# Patient Record
Sex: Female | Born: 1950 | Race: White | Hispanic: No | Marital: Married | Smoking: Never smoker
Health system: Southern US, Community
[De-identification: ages and names within clinical notes are randomized; demographics above are authoritative.]

## PROBLEM LIST (undated history)

## (undated) DIAGNOSIS — M419 Scoliosis, unspecified: Secondary | ICD-10-CM

## (undated) DIAGNOSIS — R002 Palpitations: Secondary | ICD-10-CM

## (undated) DIAGNOSIS — H6120 Impacted cerumen, unspecified ear: Secondary | ICD-10-CM

## (undated) DIAGNOSIS — J329 Chronic sinusitis, unspecified: Secondary | ICD-10-CM

## (undated) DIAGNOSIS — R0981 Nasal congestion: Secondary | ICD-10-CM

## (undated) DIAGNOSIS — K5909 Other constipation: Secondary | ICD-10-CM

## (undated) DIAGNOSIS — N200 Calculus of kidney: Secondary | ICD-10-CM

## (undated) DIAGNOSIS — K279 Peptic ulcer, site unspecified, unspecified as acute or chronic, without hemorrhage or perforation: Secondary | ICD-10-CM

## (undated) DIAGNOSIS — D229 Melanocytic nevi, unspecified: Secondary | ICD-10-CM

## (undated) DIAGNOSIS — R0789 Other chest pain: Secondary | ICD-10-CM

## (undated) DIAGNOSIS — E785 Hyperlipidemia, unspecified: Secondary | ICD-10-CM

## (undated) DIAGNOSIS — K219 Gastro-esophageal reflux disease without esophagitis: Secondary | ICD-10-CM

## (undated) DIAGNOSIS — K5790 Diverticulosis of intestine, part unspecified, without perforation or abscess without bleeding: Secondary | ICD-10-CM

## (undated) DIAGNOSIS — H409 Unspecified glaucoma: Secondary | ICD-10-CM

## (undated) DIAGNOSIS — M8580 Other specified disorders of bone density and structure, unspecified site: Secondary | ICD-10-CM

## (undated) HISTORY — DX: Palpitations: R00.2

## (undated) HISTORY — PX: LAPAROSCOPIC CHOLECYSTECTOMY: SUR755

## (undated) HISTORY — DX: Calculus of kidney: N20.0

## (undated) HISTORY — DX: Other specified disorders of bone density and structure, unspecified site: M85.80

## (undated) HISTORY — DX: Impacted cerumen, unspecified ear: H61.20

## (undated) HISTORY — PX: COLPOSCOPY: SHX161

## (undated) HISTORY — DX: Gastro-esophageal reflux disease without esophagitis: K21.9

## (undated) HISTORY — DX: Chronic sinusitis, unspecified: J32.9

## (undated) HISTORY — DX: Unspecified glaucoma: H40.9

## (undated) HISTORY — PX: GYNECOLOGIC CRYOSURGERY: SHX857

## (undated) HISTORY — DX: Nasal congestion: R09.81

## (undated) HISTORY — DX: Peptic ulcer, site unspecified, unspecified as acute or chronic, without hemorrhage or perforation: K27.9

## (undated) HISTORY — DX: Scoliosis, unspecified: M41.9

## (undated) HISTORY — DX: Melanocytic nevi, unspecified: D22.9

## (undated) HISTORY — DX: Diverticulosis of intestine, part unspecified, without perforation or abscess without bleeding: K57.90

## (undated) HISTORY — PX: CARDIAC CATHETERIZATION: SHX172

## (undated) HISTORY — DX: Hyperlipidemia, unspecified: E78.5

## (undated) HISTORY — DX: Other constipation: K59.09

## (undated) HISTORY — DX: Other chest pain: R07.89

---

## 1989-01-03 HISTORY — PX: TUBAL LIGATION: SHX77

## 1999-05-11 ENCOUNTER — Encounter: Payer: Self-pay | Admitting: Obstetrics and Gynecology

## 1999-05-11 ENCOUNTER — Encounter: Admission: RE | Admit: 1999-05-11 | Discharge: 1999-05-11 | Payer: Self-pay | Admitting: Obstetrics and Gynecology

## 1999-10-07 ENCOUNTER — Encounter (INDEPENDENT_AMBULATORY_CARE_PROVIDER_SITE_OTHER): Payer: Self-pay

## 1999-10-07 ENCOUNTER — Other Ambulatory Visit: Admission: RE | Admit: 1999-10-07 | Discharge: 1999-10-07 | Payer: Self-pay | Admitting: Obstetrics and Gynecology

## 2000-06-29 ENCOUNTER — Encounter: Payer: Self-pay | Admitting: Obstetrics and Gynecology

## 2000-06-29 ENCOUNTER — Encounter: Admission: RE | Admit: 2000-06-29 | Discharge: 2000-06-29 | Payer: Self-pay | Admitting: Obstetrics and Gynecology

## 2000-06-29 ENCOUNTER — Other Ambulatory Visit: Admission: RE | Admit: 2000-06-29 | Discharge: 2000-06-29 | Payer: Self-pay | Admitting: Obstetrics and Gynecology

## 2000-09-27 ENCOUNTER — Encounter: Payer: Self-pay | Admitting: Gastroenterology

## 2001-02-08 ENCOUNTER — Encounter: Payer: Self-pay | Admitting: Family Medicine

## 2001-02-08 ENCOUNTER — Encounter: Admission: RE | Admit: 2001-02-08 | Discharge: 2001-02-08 | Payer: Self-pay | Admitting: Family Medicine

## 2001-05-26 ENCOUNTER — Observation Stay (HOSPITAL_COMMUNITY): Admission: EM | Admit: 2001-05-26 | Discharge: 2001-05-29 | Payer: Self-pay | Admitting: Emergency Medicine

## 2001-05-26 ENCOUNTER — Encounter: Payer: Self-pay | Admitting: *Deleted

## 2001-06-29 ENCOUNTER — Other Ambulatory Visit: Admission: RE | Admit: 2001-06-29 | Discharge: 2001-06-29 | Payer: Self-pay | Admitting: Obstetrics and Gynecology

## 2001-07-18 ENCOUNTER — Encounter: Admission: RE | Admit: 2001-07-18 | Discharge: 2001-07-18 | Payer: Self-pay | Admitting: Obstetrics and Gynecology

## 2001-07-18 ENCOUNTER — Encounter: Payer: Self-pay | Admitting: Obstetrics and Gynecology

## 2002-07-04 ENCOUNTER — Other Ambulatory Visit: Admission: RE | Admit: 2002-07-04 | Discharge: 2002-07-04 | Payer: Self-pay | Admitting: Obstetrics and Gynecology

## 2002-07-23 ENCOUNTER — Encounter: Payer: Self-pay | Admitting: Obstetrics and Gynecology

## 2002-07-23 ENCOUNTER — Encounter: Admission: RE | Admit: 2002-07-23 | Discharge: 2002-07-23 | Payer: Self-pay | Admitting: Obstetrics and Gynecology

## 2003-07-10 ENCOUNTER — Other Ambulatory Visit: Admission: RE | Admit: 2003-07-10 | Discharge: 2003-07-10 | Payer: Self-pay | Admitting: Obstetrics and Gynecology

## 2003-09-28 DIAGNOSIS — K644 Residual hemorrhoidal skin tags: Secondary | ICD-10-CM | POA: Insufficient documentation

## 2004-01-27 ENCOUNTER — Ambulatory Visit: Payer: Self-pay | Admitting: Gastroenterology

## 2004-03-02 ENCOUNTER — Encounter: Payer: Self-pay | Admitting: Gastroenterology

## 2004-03-02 ENCOUNTER — Ambulatory Visit (HOSPITAL_COMMUNITY): Admission: RE | Admit: 2004-03-02 | Discharge: 2004-03-02 | Payer: Self-pay | Admitting: Gastroenterology

## 2004-03-09 ENCOUNTER — Ambulatory Visit: Payer: Self-pay | Admitting: Gastroenterology

## 2004-06-23 ENCOUNTER — Ambulatory Visit (HOSPITAL_COMMUNITY): Admission: RE | Admit: 2004-06-23 | Discharge: 2004-06-23 | Payer: Self-pay | Admitting: Addiction Medicine

## 2004-07-14 ENCOUNTER — Other Ambulatory Visit: Admission: RE | Admit: 2004-07-14 | Discharge: 2004-07-14 | Payer: Self-pay | Admitting: Obstetrics and Gynecology

## 2004-11-02 ENCOUNTER — Encounter: Admission: RE | Admit: 2004-11-02 | Discharge: 2004-11-02 | Payer: Self-pay | Admitting: Obstetrics and Gynecology

## 2004-11-02 ENCOUNTER — Ambulatory Visit: Payer: Self-pay | Admitting: Gastroenterology

## 2005-01-03 HISTORY — PX: OTHER SURGICAL HISTORY: SHX169

## 2005-05-10 DIAGNOSIS — D229 Melanocytic nevi, unspecified: Secondary | ICD-10-CM

## 2005-05-10 HISTORY — DX: Melanocytic nevi, unspecified: D22.9

## 2005-07-20 ENCOUNTER — Ambulatory Visit (HOSPITAL_COMMUNITY): Admission: RE | Admit: 2005-07-20 | Discharge: 2005-07-20 | Payer: Self-pay | Admitting: Obstetrics and Gynecology

## 2005-07-20 ENCOUNTER — Other Ambulatory Visit: Admission: RE | Admit: 2005-07-20 | Discharge: 2005-07-20 | Payer: Self-pay | Admitting: Obstetrics and Gynecology

## 2005-10-12 ENCOUNTER — Ambulatory Visit: Payer: Self-pay | Admitting: Gastroenterology

## 2006-01-20 ENCOUNTER — Ambulatory Visit: Payer: Self-pay | Admitting: Gastroenterology

## 2006-01-27 ENCOUNTER — Ambulatory Visit: Payer: Self-pay | Admitting: Gastroenterology

## 2006-01-27 ENCOUNTER — Encounter (INDEPENDENT_AMBULATORY_CARE_PROVIDER_SITE_OTHER): Payer: Self-pay | Admitting: *Deleted

## 2006-01-27 DIAGNOSIS — D1399 Benign neoplasm of ill-defined sites within the digestive system: Secondary | ICD-10-CM | POA: Insufficient documentation

## 2006-01-27 DIAGNOSIS — K449 Diaphragmatic hernia without obstruction or gangrene: Secondary | ICD-10-CM | POA: Insufficient documentation

## 2006-01-27 DIAGNOSIS — D139 Benign neoplasm of ill-defined sites within the digestive system: Secondary | ICD-10-CM

## 2006-01-27 DIAGNOSIS — K222 Esophageal obstruction: Secondary | ICD-10-CM

## 2006-04-11 ENCOUNTER — Ambulatory Visit: Payer: Self-pay | Admitting: Cardiology

## 2006-04-26 ENCOUNTER — Ambulatory Visit: Payer: Self-pay | Admitting: *Deleted

## 2006-07-25 ENCOUNTER — Ambulatory Visit (HOSPITAL_COMMUNITY): Admission: RE | Admit: 2006-07-25 | Discharge: 2006-07-25 | Payer: Self-pay | Admitting: Obstetrics and Gynecology

## 2006-07-25 ENCOUNTER — Other Ambulatory Visit: Admission: RE | Admit: 2006-07-25 | Discharge: 2006-07-25 | Payer: Self-pay | Admitting: Obstetrics and Gynecology

## 2006-10-20 ENCOUNTER — Encounter: Admission: RE | Admit: 2006-10-20 | Discharge: 2006-10-20 | Payer: Self-pay | Admitting: Family Medicine

## 2006-11-23 ENCOUNTER — Ambulatory Visit: Payer: Self-pay | Admitting: Gastroenterology

## 2006-11-28 ENCOUNTER — Ambulatory Visit (HOSPITAL_COMMUNITY): Admission: RE | Admit: 2006-11-28 | Discharge: 2006-11-28 | Payer: Self-pay | Admitting: Gastroenterology

## 2007-01-08 ENCOUNTER — Encounter: Admission: RE | Admit: 2007-01-08 | Discharge: 2007-01-08 | Payer: Self-pay | Admitting: General Surgery

## 2007-01-31 DIAGNOSIS — K589 Irritable bowel syndrome without diarrhea: Secondary | ICD-10-CM | POA: Insufficient documentation

## 2007-01-31 DIAGNOSIS — K219 Gastro-esophageal reflux disease without esophagitis: Secondary | ICD-10-CM | POA: Insufficient documentation

## 2007-02-05 ENCOUNTER — Encounter: Admission: RE | Admit: 2007-02-05 | Discharge: 2007-02-05 | Payer: Self-pay | Admitting: General Surgery

## 2007-03-03 ENCOUNTER — Emergency Department (HOSPITAL_COMMUNITY): Admission: EM | Admit: 2007-03-03 | Discharge: 2007-03-03 | Payer: Self-pay | Admitting: Emergency Medicine

## 2007-03-20 ENCOUNTER — Ambulatory Visit: Payer: Self-pay | Admitting: Cardiology

## 2007-07-26 ENCOUNTER — Ambulatory Visit (HOSPITAL_COMMUNITY): Admission: RE | Admit: 2007-07-26 | Discharge: 2007-07-26 | Payer: Self-pay | Admitting: Obstetrics and Gynecology

## 2007-07-26 ENCOUNTER — Other Ambulatory Visit: Admission: RE | Admit: 2007-07-26 | Discharge: 2007-07-26 | Payer: Self-pay | Admitting: Obstetrics and Gynecology

## 2007-09-18 ENCOUNTER — Ambulatory Visit: Payer: Self-pay | Admitting: Obstetrics and Gynecology

## 2007-11-16 ENCOUNTER — Encounter: Admission: RE | Admit: 2007-11-16 | Discharge: 2007-11-16 | Payer: Self-pay | Admitting: Family Medicine

## 2008-07-29 ENCOUNTER — Ambulatory Visit: Payer: Self-pay | Admitting: Obstetrics and Gynecology

## 2008-07-29 ENCOUNTER — Other Ambulatory Visit: Admission: RE | Admit: 2008-07-29 | Discharge: 2008-07-29 | Payer: Self-pay | Admitting: Obstetrics and Gynecology

## 2008-07-29 ENCOUNTER — Ambulatory Visit (HOSPITAL_COMMUNITY): Admission: RE | Admit: 2008-07-29 | Discharge: 2008-07-29 | Payer: Self-pay | Admitting: Obstetrics and Gynecology

## 2008-07-29 ENCOUNTER — Encounter: Payer: Self-pay | Admitting: Obstetrics and Gynecology

## 2009-02-20 ENCOUNTER — Ambulatory Visit: Payer: Self-pay | Admitting: Gastroenterology

## 2009-02-20 ENCOUNTER — Encounter (INDEPENDENT_AMBULATORY_CARE_PROVIDER_SITE_OTHER): Payer: Self-pay | Admitting: *Deleted

## 2009-02-20 DIAGNOSIS — R1319 Other dysphagia: Secondary | ICD-10-CM

## 2009-02-25 ENCOUNTER — Ambulatory Visit: Payer: Self-pay | Admitting: Gastroenterology

## 2009-02-26 ENCOUNTER — Telehealth: Payer: Self-pay | Admitting: Gastroenterology

## 2009-03-02 ENCOUNTER — Telehealth: Payer: Self-pay | Admitting: Gastroenterology

## 2009-07-24 ENCOUNTER — Telehealth: Payer: Self-pay | Admitting: Gastroenterology

## 2009-08-03 ENCOUNTER — Telehealth: Payer: Self-pay | Admitting: Gastroenterology

## 2009-08-05 ENCOUNTER — Ambulatory Visit: Payer: Self-pay | Admitting: Gastroenterology

## 2009-08-05 ENCOUNTER — Ambulatory Visit (HOSPITAL_COMMUNITY): Admission: RE | Admit: 2009-08-05 | Discharge: 2009-08-05 | Payer: Self-pay | Admitting: Obstetrics and Gynecology

## 2009-08-05 ENCOUNTER — Encounter: Payer: Self-pay | Admitting: Physician Assistant

## 2009-08-05 DIAGNOSIS — R197 Diarrhea, unspecified: Secondary | ICD-10-CM | POA: Insufficient documentation

## 2009-08-05 DIAGNOSIS — E785 Hyperlipidemia, unspecified: Secondary | ICD-10-CM | POA: Insufficient documentation

## 2009-08-05 DIAGNOSIS — A09 Infectious gastroenteritis and colitis, unspecified: Secondary | ICD-10-CM | POA: Insufficient documentation

## 2009-08-05 LAB — CONVERTED CEMR LAB
BUN: 6 mg/dL (ref 6–23)
CO2: 26 meq/L (ref 19–32)
Calcium: 9.4 mg/dL (ref 8.4–10.5)
Chloride: 105 meq/L (ref 96–112)
GFR calc non Af Amer: 108.22 mL/min (ref >60)
Potassium: 4.3 meq/L (ref 3.5–5.1)
Sodium: 144 meq/L (ref 135–145)

## 2009-08-07 LAB — CONVERTED CEMR LAB
Basophils Absolute: 0 10*3/uL (ref 0.0–0.1)
Basophils Relative: 0.3 % (ref 0.0–3.0)
HCT: 40.3 % (ref 36.0–46.0)
Hemoglobin: 13.4 g/dL (ref 12.0–15.0)
Lymphs Abs: 2.3 10*3/uL (ref 0.7–4.0)
Monocytes Relative: 8.9 % (ref 3.0–12.0)
Neutro Abs: 5.6 10*3/uL (ref 1.4–7.7)
Neutrophils Relative %: 64.1 % (ref 43.0–77.0)
RBC: 4.6 M/uL (ref 3.87–5.11)
RDW: 15.8 % — ABNORMAL HIGH (ref 11.5–14.6)

## 2009-08-26 ENCOUNTER — Ambulatory Visit: Payer: Self-pay | Admitting: Obstetrics and Gynecology

## 2009-08-26 ENCOUNTER — Other Ambulatory Visit: Admission: RE | Admit: 2009-08-26 | Discharge: 2009-08-26 | Payer: Self-pay | Admitting: Obstetrics and Gynecology

## 2009-08-27 ENCOUNTER — Ambulatory Visit: Payer: Self-pay | Admitting: Gastroenterology

## 2009-08-27 ENCOUNTER — Encounter (INDEPENDENT_AMBULATORY_CARE_PROVIDER_SITE_OTHER): Payer: Self-pay | Admitting: *Deleted

## 2009-10-21 ENCOUNTER — Ambulatory Visit: Payer: Self-pay | Admitting: Gastroenterology

## 2010-02-04 NOTE — Assessment & Plan Note (Signed)
Summary: acid reflux--ch.   History of Present Illness Visit Type: Follow-up Visit Primary GI MD: Sheryn Bison MD FACP FAGA Primary Provider: Rudi Heap, MD Chief Complaint: heartburn and reflux, feels a burning in chest and feels like somehting is stuck in throat History of Present Illness:   Several weeks of dysphagia and burning substernal chest pain and mouth pain and a 60 year old Caucasian female status post cholecystectomy. She has been on antibiotics for recurrent sinusitis. She takes Prilosec 20 mg twice a day chronically for acid reflux. Since her cholecystectomy she said no problems with constipation. She denies any systemic complaints or history of immune deficiency. She does not smoke or abuse ethanol.   GI Review of Systems    Reports acid reflux, belching, and  heartburn.      Denies abdominal pain, bloating, chest pain, dysphagia with liquids, dysphagia with solids, loss of appetite, nausea, vomiting, vomiting blood, weight loss, and  weight gain.        Denies anal fissure, black tarry stools, change in bowel habit, constipation, diarrhea, diverticulosis, fecal incontinence, heme positive stool, hemorrhoids, irritable bowel syndrome, jaundice, light color stool, liver problems, rectal bleeding, and  rectal pain. Preventive Screening-Counseling & Management  Alcohol-Tobacco     Smoking Status: never      Drug Use:  no.      Current Medications (verified): 1)  Omeprazole 20 Mg  Cpdr (Omeprazole) .Marland Kitchen.. 1 Twice A Day 30 Minutes Before Meals 2)  Lipitor 20 Mg Tabs (Atorvastatin Calcium) .... Take 1 Tablet By Mouth Once A Day 3)  Vitamin D3 1000 Unit Caps (Cholecalciferol) .... Take 1 Capsule By Mouth Once A Day 4)  Vitamin B-6 Cr 200 Mg Cr-Tabs (Pyridoxine Hcl) .... Take 1 Tablet By Mouth Once A Day 5)  Cranberry 250 Mg Caps (Cranberry) .... Take 1 Capsule By Mouth Once A Day 6)  Lovaza 1 Gm Caps (Omega-3-Acid Ethyl Esters) .... Take 1 Capsule By Mouth Two Times A  Day 7)  Biotin 10 Mg Tabs (Biotin) .... Take 1 Tablet By Mouth Once A Day 8)  Calcium-Magnesium 500-250 Mg Tabs (Calcium-Magnesium) .... Take 1 Tablet By Mouth Once A Day  Allergies (verified): 1)  Sulfa  Past History:  Past medical, surgical, family and social histories (including risk factors) reviewed for relevance to current acute and chronic problems.  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 No FH of Colon Cancer: Family History of Heart Disease: Mother, MGM, Mat Uncle-MI deceased, Father-deceased 54 MI Family History of Diabetes: Sister  Social History: Reviewed history and no changes required. Married, 1 girl [place] [employer] Patient has never smoked.  Alcohol Use - no Daily Caffeine Use 1 cup coffee/day Illicit Drug Use - no Smoking Status:  never Drug Use:  no  Review of Systems  The patient denies allergy/sinus, anemia, anxiety-new, arthritis/joint pain, back pain, blood in urine, breast changes/lumps, confusion, cough, coughing up blood, depression-new, fainting, fatigue, fever, headaches-new, hearing problems, heart murmur, heart rhythm changes, itching, menstrual pain, muscle pains/cramps, night sweats, nosebleeds, pregnancy symptoms, shortness of breath, skin rash, sleeping problems, sore throat, swelling of feet/legs, swollen lymph glands, thirst - excessive, urination - excessive, urination changes/pain, urine leakage, vision changes, and voice change.    Vital Signs:  Patient profile:   60 year old female Height:  58.5 inches Weight:      111 pounds BMI:     22.89 Pulse rate:   80 / minute Pulse rhythm:   regular BP sitting:   130 / 84  (left arm) Cuff size:   regular  Vitals Entered By: Francee Piccolo CMA Duncan Dull) (February 20, 2009 11:54 AM)  Physical Exam  General:  Well developed, well nourished, no acute distress.healthy appearing.   Head:  Normocephalic and atraumatic. Eyes:  PERRLA, no icterus.exam deferred to patient's ophthalmologist.   Mouth:  No deformity or lesions, dentition normal.No oral lesions seen on close inspection. Neck:  Supple; no masses or thyromegaly. Lungs:  Clear throughout to auscultation. Heart:  Regular rate and rhythm; no murmurs, rubs,  or bruits. Abdomen:  Soft, nontender and nondistended. No masses, hepatosplenomegaly or hernias noted. Normal bowel sounds. Neurologic:  Alert and  oriented x4;  grossly normal neurologically. Cervical Nodes:  No significant cervical adenopathy. Inguinal Nodes:  No significant inguinal adenopathy. Psych:  Alert and cooperative. Normal mood and affect.   Impression & Recommendations:  Problem # 1:  DYSPHAGIA (YNW-295.62) Assessment Deteriorated  Probable Candida esophagitis related to chronic PPI use and antibiotic therapy. I have started nystatin mouthwash 4 times a day and I will schedule her for repeat endoscopic exam. I have stopped her Prilosec therapy pending further evaluation.  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 Rudi Heap 2)  Conscious Sedation brochure given.  3)  Upper Endoscopy brochure given.  4)  Stop Prilosec. 5)  Rx has been sent to your pharmacy for a Mycostatin. 6)  The medication list was reviewed and reconciled.  All changed / newly prescribed medications were explained.  A complete medication list was provided to the patient / caregiver. Prescriptions: NYSTATIN 100000 UNIT/ML SUSP (NYSTATIN) 5 cc qid  #200 ml x 1   Entered by:   Ashok Cordia RN   Authorized by:   Mardella Layman MD [facility]   Signed by:   Ashok Cordia RN on 02/20/2009   Method used:   [facility]         [phone][facility] (retail[facility]       [address]       [place], [place]  [address]       Ph: [phone] or [phone]       Fax: [phone]   RxID:   [id number]

## 2010-02-04 NOTE — Assessment & Plan Note (Signed)
Summary: Diarrhea cont's/dfs   History of Present Illness Visit Type: Follow-up Visit Primary GI MD: Sheryn Bison MD FACP FAGA Primary Provider: Rudi Heap, MD Requesting Provider: n/a Chief Complaint: diarrhea for about 3 weeks, abdominal pains that radiates to her back History of Present Illness:   PLEASANT 60 YO FEMALE KNOWN TO DR. PATTERSON. SHE HAS HX OF GERD. SHE HAD A COLONOSCOPY IN 2002, NORMAL EXCEPTEXT. HEMORRHOIDS. SHE COMES IN NOW WITH C/O DIARRHEA OVER THE PAST 3 WEEKS. THIS HAS BEEN ASSOCIATED WITH CRAMPING AND LOW BACK DISCOMFORT. SHE IS HAVING 7-8 WATERY STOOLS PER DAY, NO BLOOD. NO FEVER, NO NAUSEA OR VOMITING. HER ENERGY LEVEL IS LOW.  SHE HAD TAKEN  A ZPAK ABOUT 5 WEEKS AGO  FOR  SINUS INFECTION. SHE WAS STARTED ON FLAGYL AFTER SHE CALLED HERE, AND FINISHED A 7 DAY COURSE  A WEEK AGO. SHE IS NO BETTER. SHE HAD ALSO BEEN ON FLORASTOR.   GI Review of Systems     Location of  Abdominal pain: generalized.    Denies abdominal pain, acid reflux, belching, bloating, chest pain, dysphagia with liquids, dysphagia with solids, heartburn, loss of appetite, nausea, vomiting, vomiting blood, weight loss, and  weight gain.      Reports diarrhea.     Denies anal fissure, black tarry stools, change in bowel habit, constipation, diverticulosis, fecal incontinence, heme positive stool, hemorrhoids, irritable bowel syndrome, jaundice, light color stool, liver problems, rectal bleeding, and  rectal pain.   Current Medications (verified): 1)  Lipitor 20 Mg Tabs (Atorvastatin Calcium) .... Take 1 Tablet By Mouth Once A Day 2)  Vitamin D3 1000 Unit Caps (Cholecalciferol) .... Take 1 Capsule By Mouth Once A Day 3)  Vitamin B-6 Cr 200 Mg Cr-Tabs (Pyridoxine Hcl) .... Take 1 Tablet By Mouth Once A Day 4)  Cranberry 250 Mg Caps (Cranberry) .... Take 1 Capsule By Mouth Once A Day 5)  Lovaza 1 Gm Caps (Omega-3-Acid Ethyl Esters) .... Take 1 Capsule By Mouth Two Times A Day 6)   Calcium-Magnesium 500-250 Mg Tabs (Calcium-Magnesium) .... Take 1 Tablet By Mouth Once A Day 7)  Lansoprazole 30 Mg Cpdr (Lansoprazole) .Marland Kitchen.. 1 By Mouth Qd  Allergies (verified): 1)  Sulfa  Past History:  Past Surgical History: Last updated: 02/20/2009 Cholecystectomy Tubal Ligation  Family History: Last updated: 02/20/2009 No FH of Colon Cancer: Family History of Heart Disease: Mother, MGM, Mat Uncle-MI deceased, Father-deceased 65 MI Family History of Diabetes: Sister  Social History: Last updated: 02/20/2009 Married, 1 girl [employer] Patient has never smoked.  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) G E R D (ICD-530.81),ESOPHAGEAL STRICTURE S/P DILATION1/08  Hyperlipidemia  Review of Systems  The patient denies allergy/sinus, anemia, anxiety-new, arthritis/joint pain, back pain, blood in urine, breast changes/lumps, change in vision, confusion, cough, coughing up blood, depression-new, fainting, fatigue, fever, headaches-new, hearing problems, heart murmur, heart rhythm changes, itching, menstrual pain, muscle pains/cramps, night sweats, nosebleeds, pregnancy symptoms, shortness of breath, skin rash, sleeping problems, sore throat, swelling of feet/legs, swollen lymph glands, thirst - excessive , urination - excessive , urination changes/pain, urine leakage, vision changes, and voice change.         +FATIGUE, OTHERWISE AS IN HPI  Vital Signs:  Patient profile:   60 year old female Height:      58.5 inches Weight:      108 pounds BMI:  22.27 BSA:     1.41 Pulse rate:   112 / minute Pulse rhythm:   regular BP sitting:   112 / 72  (left arm)  Vitals Entered By: Merri Ray CMA Duncan Dull) (August 05, 2009 8:32 AM)  Physical Exam  General:  Well developed, well nourished, no acute distress. Head:  Normocephalic and atraumatic. Eyes:  PERRLA,  no icterus. Lungs:  Clear throughout to auscultation. Heart:  Regular rate and rhythm; no murmurs, rubs,  or bruits. Abdomen:  SOFT, MILD TENDERNESS LEFT ABDOMEN, NO GUARDING,BS+,NO MASS OR HSM Rectal:  NOT DONE Extremities:  No clubbing, cyanosis, edema or deformities noted. Neurologic:  Alert and  oriented x4;  grossly normal neurologically. Psych:  Alert and cooperative. Normal mood and affect.  Impression & Recommendations:  Problem # 1:  DIARRHEA-PRESUMED INFECTIOUS (ICD-009.3) Assessment New 60 Y.O FEMALE WITH 3 WEEK HX OF DIARRHEA-ONSET POST  ANTIBIOTIC FOR Z PAK,AND UNRESPOSIVE TO FLAGYL. MOST LIKELY THIS IS C.DIFF. LABS TODAY STOOL CULTURES START VANCOMYCIN 250 MG 4 X DAILY X 2 WEEKS RESTART FLORASTOR TWICE DAILY X 3 WEEKS ADD BENTYL 10 MG 2-3 X DAILY ROV IN  2-3 WEEKS WITH AMY ESTERWOOD OR DR. PATTERSON. PT ADVISED TO CALL IF SXS WORSEN OR FAIL TO IMPROVE IN THE INTERIM.  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 2)  We made you a follow up appointment with Dr. Jarold Motto on 08-27-09. 3)  Appointment card given. 4)  We called [facility] and they are filling prescriptions for Vancomycin, Bentyl and Florastor. 5)  Copy sent to : Rudi Heap, MD 6)  The medication list was reviewed and reconciled.  All changed / newly prescribed medications were explained.  A complete medication list was provided to the patient / caregiver.

## 2010-02-04 NOTE — Progress Notes (Signed)
Summary: triage  Phone Note Call from Patient Call back at Work Phone [phone]   Caller: Patient Call For: Jarold Motto Reason for Call: Talk to Nurse Summary of Call: Patient would like to be seen today or have somehting called in for severe diarrhea. Initial call taken by: Tawni Levy,  July 24, 2009 8:31 AM  Follow-up for Phone Call        Pt states she started having diarhea 6 days ago.  Thought at first it was a virus but diarrhea has cont.  Not as bad as when it first started.  Dairrhea comes and goes.  Pt did take Z Pak the last week of June.  Pt started on [facility] colon health and she has taken Imodium as needed.  No nausea or vomiting.   Follow-up by: Ashok Cordia RN,  July 24, 2009 9:13 AM  Additional Follow-up for Phone Call Additional follow up Details #1::        metronidazole 250 mg t.i.d. for 10 days b.i.d. Florstar. Additional Follow-up by: Mardella Layman MD FACG,  July 24, 2009 12:56 PM    Additional Follow-up for Phone Call Additional follow up Details #2::    Pt notified. Follow-up by: Ashok Cordia RN,  July 24, 2009 2:24 PM  New/Updated Medications: METRONIDAZOLE 250 MG TABS (METRONIDAZOLE) 1 by mouth three times a day FLORASTOR 250 MG CAPS (SACCHAROMYCES BOULARDII) two times a day X 10 days Prescriptions: METRONIDAZOLE 250 MG TABS (METRONIDAZOLE) 1 by mouth three times a day  #30 x 0   Entered by:   Ashok Cordia RN   Authorized by:   Mardella Layman MD [facility]   Signed by:   Ashok Cordia RN on 07/24/2009   Method used:   [facility]        [facility] [phone]* (retail)       [address]       [place], [place]  [address]       Ph: [phone] or [phone]       Fax: [phone]   RxID:   [id number]

## 2010-02-04 NOTE — Procedures (Signed)
Summary: Colonoscopy  Patient: [name] Note: All result statuses are Final unless otherwise noted.  Tests: (1) Colonoscopy (COL)   COL Colonoscopy           DONE     [facility]     [address]     [place], [place]  [address]           COLONOSCOPY PROCEDURE REPORT           PATIENT:  [name], [name]  MR#:  [id number]     BIRTHDATE:  12-31-1950, 59 yrs. old  GENDER:  female     ENDOSCOPIST:  Vania Rea. Jarold Motto, MD, [facility]     REF. BY:     PROCEDURE DATE:  10/21/2009     PROCEDURE:  Average-risk screening colonoscopy     G0121     ASA CLASS:  Class II     INDICATIONS:  Routine Risk Screening     MEDICATIONS:   Fentanyl 75 mcg IV, Versed 6 mg IV           DESCRIPTION OF PROCEDURE:   After the risks benefits and     alternatives of the procedure were thoroughly explained, informed     consent was obtained.  Digital rectal exam was performed and     revealed no abnormalities.   The LB 180AL [id number] endoscope was     introduced through the anus and advanced to the cecum, which was     identified by both the appendix and ileocecal valve, limited by a     redundant colon.    The quality of the prep was excellent, using     MoviPrep.  The instrument was then slowly withdrawn as the colon     was fully examined.     <<PROCEDUREIMAGES>>           FINDINGS:  Moderate diverticulosis was found in the sigmoid to     descending colon segments.  No polyps or cancers were seen.     Retroflexed views in the rectum revealed no abnormalities.    The     scope was then withdrawn from the patient and the procedure     completed.           COMPLICATIONS:  None     ENDOSCOPIC IMPRESSION:     1) Moderate diverticulosis in the sigmoid to descending colon     segments     2) No polyps or cancers     RECOMMENDATIONS:     1) high fiber diet     2) Continue current colorectal screening recommendations for     "routine risk" patients with a repeat colonoscopy in 10 years.     REPEAT EXAM:   No           ______________________________     Vania Rea. Jarold Motto, MD, Clementeen Graham           CC:  Rudi Heap, MD           n.     Rosalie Doctor:   Vania Rea. Patterson at 10/21/2009 11:56 AM           Otho Bellows, [phone]  Note: An exclamation mark (!) indicates a result that was not dispersed into the flowsheet. Document Creation Date: 10/21/2009 11:57 AM _______________________________________________________________________  (1) Order result status: Final Collection or observation date-time: 10/21/2009 11:49 Requested date-time:  Receipt date-time:  Reported date-time:  Referring Physician:   Ordering Physician: Sheryn Bison [phone]) Specimen Source:  Source: Launa Grill Order Number: [id number] Lab site:   Appended Document: Colonoscopy    Clinical Lists Changes  Observations: Added new observation of COLONNXTDUE: 10/2019 (10/21/2009 13:08)

## 2010-02-04 NOTE — Progress Notes (Signed)
Summary: EGD f/u  Phone Note Call from Patient Call back at Work Phone [phone]   Caller: Patient Call For: Dr. Jarold Motto Reason for Call: Talk to Nurse Summary of Call: pt had EGD yesterday and husband was care partner... pt says husband didnt retain any of the post-procedure care information... would like a nurse to call her and go over all post care with her and any needed f/u appts Initial call taken by: Vallarie Mare,  February 26, 2009 8:30 AM  Follow-up for Phone Call        Attempted to return pts. phone call with no answer.  Will try again later. Follow-up by: Jennye Boroughs RN,  February 26, 2009 8:41 AM  Additional Follow-up for Phone Call Additional follow up Details #1::        Pt has questions reguarding her medications.  She is currently taking nystatin and was told to hold her omeprazole.  She has a refill on her nystatin and wants to know how much longer to take it and when to resume her omeprazole. Please advise. Additional Follow-up by: Jennye Boroughs RN,  February 26, 2009 8:58 AM    Additional Follow-up for Phone Call Additional follow up Details #2::    she can stop her nystatin and resume omeprazole. We need to schedule manometry and 24-hour pH probe testing. Follow-up by: Mardella Layman MD Clementeen Graham,  February 26, 2009 9:20 AM  Additional Follow-up for Phone Call Additional follow up Details #3:: Details for Additional Follow-up Action Taken: Phoned pt. and advised her per Dr. Jarold Motto to stop her Nystatin and resume Omeprazole.  Also informed her that Lupita Leash will be calling her to schedule manometry testing.  Pt. verbalized understanding.  Additional Follow-up by: Jennye Boroughs RN,  February 26, 2009 10:14 AM   Appended Document: EGD f/u Does pt need to be off PPI prior to manometry/24 hr PH?  Appended Document: EGD f/u Does pt need to stop PPI prior to Man/24 hr ph?  Appended Document: EGD f/u yes for 72h  Appended Document: EGD f/u See  Phone note.   Clinical Lists Changes  Orders: Added new Test order of Mano/24 hour pH Probe (Mano/24 pH) - Signed

## 2010-02-04 NOTE — Progress Notes (Signed)
Summary: Diarrhea meds not really working  Phone Note Call from Patient Call back at Work Phone [phone]   Call For: Dr Jarold Motto Reason for Call: Talk to Nurse Summary of Call: Medicine for diarrhea not really helping. Initial call taken by: Leanor Kail [facility],  August 03, 2009 8:48 AM  Follow-up for Phone Call        Pt will finish metronidaxole tomorrow.  Diarrhea is no better.  OV sch with Amy Esterwood, 08/05/09 at 8:30.  Pt advised of cancellation fee. Follow-up by: Ashok Cordia RN,  August 03, 2009 9:14 AM

## 2010-02-04 NOTE — Letter (Signed)
Summary: EGD Instructions  [facility]  [address] [place], [place] [address]   Phone: [phone]  Fax: [phone]       [name]    Jun 11, 1950    MRN: [id number]       Procedure Day Dorna Bloom: Wednesday, 02/25/09     Arrival Time:  10:30     Procedure Time: 11:30     Location of Procedure:                    _X  _ [facility] (4th Floor)    PREPARATION FOR ENDOSCOPY   On 02/25/09 THE DAY OF THE PROCEDURE:  1.   No solid foods, milk or milk products are allowed after midnight the night before your procedure.  2.   Do not drink anything colored red or purple.  Avoid juices with pulp.  No orange juice.  3.  You may drink clear liquids until 9:30, which is 2 hours before your procedure.                                                                                                CLEAR LIQUIDS INCLUDE: Water Jello Ice Popsicles Tea (sugar ok, no milk/cream) Powdered fruit flavored drinks Coffee (sugar ok, no milk/cream) Gatorade Juice: apple, white grape, white cranberry  Lemonade Clear bullion, consomm, broth Carbonated beverages (any kind) Strained chicken noodle soup Hard Candy   MEDICATION INSTRUCTIONS  Unless otherwise instructed, you should take regular prescription medications with a small sip of water as early as possible the morning of your procedure.                        OTHER INSTRUCTIONS  You will need a responsible adult at least 61 years of age to accompany you and drive you home.   This person must remain in the waiting room during your procedure.  Wear loose fitting clothing that is easily removed.  Leave jewelry and other valuables at home.  However, you may wish to bring a book to read or an iPod/MP3 player to listen to music as you wait for your procedure to start.  Remove all body piercing jewelry and leave at home.  Total time from sign-in until discharge is approximately 2-3 hours.  You  should go home directly after your procedure and rest.  You can resume normal activities the day after your procedure.  The day of your procedure you should not:   Drive   Make legal decisions   Operate machinery   Drink alcohol   Return to work  You will receive specific instructions about eating, activities and medications before you leave.    The above instructions have been reviewed and explained to me by   _______________________    I fully understand and can verbalize these instructions _____________________________ Date _________

## 2010-02-04 NOTE — Procedures (Signed)
Summary: Upper Endoscopy  Patient: [name] Note: All result statuses are Final unless otherwise noted.  Tests: (1) Upper Endoscopy (EGD)   EGD Upper Endoscopy       DONE     [facility]     [address]     [place], [place]  [address]           ENDOSCOPY PROCEDURE REPORT           PATIENT:  [name], [name]  MR#:  [id number]     BIRTHDATE:  08/01/1950, 59 yrs. old  GENDER:  female           ENDOSCOPIST:  Vania Rea. Jarold Motto, MD, [facility]     Referred by:           PROCEDURE DATE:  02/25/2009     PROCEDURE:  [name] Dilation of Esophagus, Esophagogastroscopy     ASA CLASS:  Class II     INDICATIONS:  dysphagia, GERD, chest pain           MEDICATIONS:   Fentanyl 50 mcg IV, Versed 9 mg IV     TOPICAL ANESTHETIC:  Exactacain Spray           DESCRIPTION OF PROCEDURE:   After the risks benefits and     alternatives of the procedure were thoroughly explained, informed     consent was obtained.  The LB GIF-H180 [id number] endoscope was     introduced through the mouth and advanced to the second portion of     the duodenum, without limitations.  The instrument was slowly     withdrawn as the mucosa was fully examined.     <<PROCEDUREIMAGES>>           A Schatzki's ring was found at the gastroesophageal junction.     DILATED #32F MALONEY.PASSED EASILY.  Normal duodenal folds were     noted.  The stomach was entered and closely examined. The antrum,     angularis, and lesser curvature were well visualized, including a     retroflexed view of the cardia and fundus. The stomach wall was     normally distensable. The scope passed easily through the pylorus     into the duodenum.  The esophagus and gastroesophageal junction     were completely normal in appearance.    Retroflexed views     revealed no abnormalities.  MULTIPLE SMALL BENIGN HYPERPLASTIC     POLYPS NOTED.  The scope was then withdrawn from the patient and     the procedure completed.           COMPLICATIONS:  None       ENDOSCOPIC IMPRESSION:     1) Schatzki's ring at the gastroesophageal junction     2) Normal duodenal folds     3) Normal stomach     4) Normal esophagus     ?? RESOLVING CANDIDA.STARTED MYCELEX ON FEB. 18.     RECOMMENDATIONS:     1) continue current meds     2) dilatations PRN     3) post dilation instructions           REPEAT EXAM:  No           ______________________________     Vania Rea. Jarold Motto, MD, [facility]           CC:  Rudi Heap, MD           n.     Rosalie Doctor:   Onalee Hua  Hale Bogus at 02/25/2009 12:01 PM           Otho Bellows, [phone]  Note: An exclamation mark (!) indicates a result that was not dispersed into the flowsheet. Document Creation Date: 02/25/2009 12:02 PM _______________________________________________________________________  (1) Order result status: Final Collection or observation date-time: 02/25/2009 11:55 Requested date-time:  Receipt date-time:  Reported date-time:  Referring Physician:   Ordering Physician: Sheryn Bison [phone]) Specimen Source:  Source: Launa Grill Order Number: [id number] Lab site:

## 2010-02-04 NOTE — Assessment & Plan Note (Signed)
Summary: F/U Diarrhea, saw PA   History of Present Illness Visit Type: Follow-up Visit Primary GI MD: Sheryn Bison MD FACP FAGA Primary Provider: Rudi Heap, MD Requesting Provider: n/a Chief Complaint: F/u from office visit with Amy for diarrhea. Pt states that the diarrhea is better and denies any GI complaints  History of Present Illness:   [name] is probably asymptomatic after completing a two-week course of tapering doses of oral vancomycin for suspected C. difficile enterocolitis. Stool exam for lactoferrin was positive but C. difficile toxin was negative. She had been on a Z-Pak before her diarrhea began, but had not responded to oral metronidazole. With her vancomycin she also took probiotics in the form of Florstar twice a day. It is of note that she is chronically on lansoprazole 30 mg for acid reflux.  She denies abdominal cramping, diarrhea, rectal bleeding. Last colonoscopy was in September of 2002. She is status post cholecystectomy. She has a past history of constipation predominant IBS.   GI Review of Systems      Denies abdominal pain, acid reflux, belching, bloating, chest pain, dysphagia with liquids, dysphagia with solids, heartburn, loss of appetite, nausea, vomiting, vomiting blood, weight loss, and  weight gain.        Denies anal fissure, black tarry stools, change in bowel habit, constipation, diarrhea, diverticulosis, fecal incontinence, heme positive stool, hemorrhoids, irritable bowel syndrome, jaundice, light color stool, liver problems, rectal bleeding, and  rectal pain.    Current Medications (verified): 1)  Lipitor 20 Mg Tabs (Atorvastatin Calcium) .... Take 1 Tablet By Mouth Once A Day 2)  Vitamin D3 1000 Unit Caps (Cholecalciferol) .... Take 1 Capsule By Mouth Once A Day 3)  Vitamin B-6 Cr 200 Mg Cr-Tabs (Pyridoxine Hcl) .... Take 1 Tablet By Mouth Once A Day 4)  Cranberry 250 Mg Caps (Cranberry) .... Take 1 Capsule By Mouth Once A Day 5)  Lovaza 1 Gm  Caps (Omega-3-Acid Ethyl Esters) .... Take 1 Capsule By Mouth Two Times A Day 6)  Calcium-Magnesium 500-250 Mg Tabs (Calcium-Magnesium) .... Take 1 Tablet By Mouth Once A Day 7)  Lansoprazole 30 Mg Cpdr (Lansoprazole) .Marland Kitchen.. 1 By Mouth Qd 8)  Florastor 250 Mg Caps (Saccharomyces Boulardii) .... One Tablet By Mouth Two Times A Day 9)  Loratadine 10 Mg Tabs (Loratadine) .... One Tablet By Mouth Once Daily  Allergies (verified): 1)  Sulfa  Past History:  Past medical, surgical, family and social histories (including risk factors) reviewed for relevance to current acute and chronic problems.  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 Cholecystectomy Tubal Ligation  Family History: Reviewed history from 02/20/2009 and no changes required. No FH of Colon Cancer: Family History of Heart Disease: Mother, MGM, Mat Uncle-MI deceased, Father-deceased 63 MI Family History of Diabetes: Sister  Social History: Reviewed history from 02/20/2009 and no changes required. Married, 1 girl [place] [employer] Patient has never smoked.  Alcohol Use - no Daily Caffeine Use 1 cup coffee/day Illicit Drug Use - no  Review of Systems  The patient denies allergy/sinus, anemia, anxiety-new, arthritis/joint pain, back pain, blood in urine, breast changes/lumps, change in vision, confusion, cough, coughing up blood, depression-new, fainting, fatigue, fever, headaches-new, hearing problems, heart murmur, heart rhythm changes, itching,  menstrual pain, muscle pains/cramps, night sweats, nosebleeds,  pregnancy symptoms, shortness of breath, skin rash, sleeping problems, sore throat, swelling of feet/legs, swollen lymph glands, thirst - excessive , urination - excessive , urination changes/pain, urine leakage, vision changes, and voice change.    Vital Signs:  Patient profile:   60 year old female Height:      58.5 inches Weight:      107 pounds BMI:     22.06 BSA:     1.41 Pulse rate:   92 / minute Pulse rhythm:   regular BP sitting:   120 / 76  (left arm) Cuff size:   regular  Vitals Entered By: Ok Anis CMA (August 27, 2009 9:36 AM)  Physical Exam  General:  Well developed, well nourished, no acute distress.healthy appearing.   Head:  Normocephalic and atraumatic. Eyes:  PERRLA, no icterus.exam deferred to patient's ophthalmologist.   Abdomen:  Soft, nontender and nondistended. No masses, hepatosplenomegaly or hernias noted. Normal bowel sounds. Extremities:  No clubbing, cyanosis, edema or deformities noted. Neurologic:  Alert and  oriented x4;  grossly normal neurologically. Psych:  Alert and cooperative. Normal mood and affect.   Impression & Recommendations:  Problem # 1:  DIARRHEA-PRESUMED INFECTIOUS (ICD-009.3) Assessment Improved Continue Florstar twice a day for another week. She has been advised to 20% incidence of relapse with C. difficile infection. She is to notify us in the future if she takes any antibiotics, and I would advise probiotic coverage. Followup colonoscopy at the scheduled her convenience. She is to continue her other medications listed and reviewed her record.  Problem # 2:  G E R D (ICD-530.81) Assessment: Improved Will continue daily PPI therapy per her history of recurrent esophageal strictures from acid reflux.  Patient Instructions: 1)  Please pick up your medication from the pharmacy. MOVIPREP 2)  You are scheduled for a colonoscopy on 10/21/09. 3)  [facility] Patient Information Guide given to patient.  4)  Colonoscopy  and Flexible Sigmoidoscopy brochure given.  5)  Continue Florastor until samples are gone. 6)  The medication list was reviewed and reconciled.  All changed / newly prescribed medications were explained.  A complete medication list was provided to the patient / caregiver. Prescriptions: MOVIPREP 100 GM  SOLR (PEG-KCL-NACL-NASULF-NA ASC-C) As per prep instructions.  #1 x 0   Entered by:   Ashok Cordia RN   Authorized by:   Mardella Layman MD [facility]   Signed by:   Ashok Cordia RN on 08/27/2009   Method used:   [facility]        [facility] [phone]* (retail)       [address]       [place], [place]  [address]       Ph: [phone] or [phone]       Fax: [phone]   RxID:   [id number]   Appended Document: F/U Diarrhea, saw PA    Clinical Lists Changes  Orders: Added new Test order of Colonoscopy (Colon) - Signed

## 2010-02-04 NOTE — Progress Notes (Signed)
Summary: Manometry and 24 HR PH study.  Phone Note Outgoing Call   Call placed by: Ashok Cordia RN,  March 02, 2009 2:07 PM Summary of Call: LM for pt to call.  Pt needs manometry and 24 hr ph study.  Proc scheduled for March 21 at 8:30 at [facility].   Pt needs to leave off any PPI's for 72 hrs prior to proc. Initial call taken by: Ashok Cordia RN,  March 02, 2009 2:08 PM  Follow-up for Phone Call        Talked to pt re manometry and ph study. Pt states she has had these test done before.  She does not want to do them again.  States she did not tolerate the tube very well.  Old chart ordered so report can be reviewed.  Pt asks about changing medication.  She had recently changed from prevacid to omeprazole due to cost of med.  Pt feels like she did better on the prevacid two times a day.  Asks if Dr. Jarold Motto thinks it would help to change med. Follow-up by: Ashok Cordia RN,  March 03, 2009 8:22 AM  Additional Follow-up for Phone Call Additional follow up Details #1::        She has noncardiac chest pain... I. cannot autherize b.i.d. PPI therapy. There is no evidence that her chest pain is related to acid reflux. She needs to followup with Dr. Christell Constant in [place].This is currentl a primary care problem. Forward this to Dr. Christell Constant. Additional Follow-up by: Mardella Layman MD Clement[facility] Graham,  March 03, 2009 10:24 AM    Additional Follow-up for Phone Call Additional follow up Details #2::    Lm for pt to call.  Ashok Cordia RN  March 05, 2009 12:15 PM  Pt notified.  She req rx for once a day prevacid be sent to her pharmacy for her to try.  She will report back Follow-up by: Ashok Cordia RN,  March 06, 2009 8:06 AM  New/Updated Medications: LANSOPRAZOLE 30 MG CPDR (LANSOPRAZOLE) 1 by mouth qd Prescriptions: LANSOPRAZOLE 30 MG CPDR (LANSOPRAZOLE) 1 by mouth qd  #30 x 6   Entered by:   Ashok Cordia RN   Authorized by:   Mardella Layman MD [facility]   Signed by:   Ashok Cordia RN on 03/06/2009   Method used:   [facility] to        [facility] [phone]* (retail)       [address]       [place], [place]  [address]       Ph: [phone] or [phone]       Fax: [phone]   RxID:   [id number]

## 2010-02-04 NOTE — Letter (Signed)
Summary: [facility] Instructions  [facility]  [address] [place], [place] [address]   Phone: [phone]  Fax: [phone]       CASSARA NIDA    1950/06/30    MRN: [id number]     Procedure Day Dorna BloomLulu Riding, 10/21/09     Arrival Time: 10:00 AM     Procedure Time: 11:00 AM    Location of Procedure:                    [facility] (4th Floor)  PREPARATION FOR COLONOSCOPY WITH MOVIPREP   Starting 5 days prior to your procedure 10/16/09 do not eat nuts, seeds, popcorn, corn, beans, peas,  salads, or any raw vegetables.  Do not take any fiber supplements (e.g. Metamucil, Citrucel, and Benefiber).  THE DAY BEFORE YOUR PROCEDURE         TUESDAY, 10/20/09  1.  Drink clear liquids the entire day-NO SOLID FOOD  2.  Do not drink anything colored red or purple.  Avoid juices with pulp.  No orange juice.  3.  Drink at least 64 oz. (8 glasses) of fluid/clear liquids during the day to prevent dehydration and help the prep work efficiently.  CLEAR LIQUIDS INCLUDE: Water Jello Ice Popsicles Tea (sugar ok, no milk/cream) Powdered fruit flavored drinks Coffee (sugar ok, no milk/cream) Gatorade Juice: apple, white grape, white cranberry  Lemonade Clear bullion, consomm, broth Carbonated beverages (any kind) Strained chicken noodle soup Hard Candy                           4.  In the morning, mix first dose of MoviPrep solution:    Empty 1 Pouch A and 1 Pouch B into the disposable container    Add lukewarm drinking water to the top line of the container. Mix to dissolve    Refrigerate (mixed solution should be used within 24 hrs)  5.  Begin drinking the prep at 5:00 p.m. The MoviPrep container is divided by 4 marks.   Every 15 minutes drink the solution down to the next mark (approximately 8 oz) until the full liter is complete.   6.  Follow completed prep with 16 oz of clear liquid of your choice (Nothing red or purple).  Continue to drink clear  liquids until bedtime.  7.  Before going to bed, mix second dose of MoviPrep solution:    Empty 1 Pouch A and 1 Pouch B into the disposable container    Add lukewarm drinking water to the top line of the container. Mix to dissolve    Refrigerate  THE DAY OF YOUR PROCEDURE      WEDNESDAY, 10/21/09  Beginning at 6:00 a.m. (5 hours before procedure):         1. Every 15 minutes, drink the solution down to the next mark (approx 8 oz) until the full liter is complete.  2. Follow completed prep with 16 oz. of clear liquid of your choice.    3. You may drink clear liquids until 9:00 AM (2 HOURS BEFORE PROCEDURE).  MEDICATION INSTRUCTIONS  Unless otherwise instructed, you should take regular prescription medications with a small sip of water   as early as possible the morning of your procedure.       OTHER INSTRUCTIONS  You will need a responsible adult at least 61 years of age to accompany you and drive you home.   This person must  remain in the waiting room during your procedure.  Wear loose fitting clothing that is easily removed.  Leave jewelry and other valuables at home.  However, you may wish to bring a book to read or  an iPod/MP3 player to listen to music as you wait for your procedure to start.  Remove all body piercing jewelry and leave at home.  Total time from sign-in until discharge is approximately 2-3 hours.  You should go home directly after your procedure and rest.  You can resume normal activities the  day after your procedure.  The day of your procedure you should not:   Drive   Make legal decisions   Operate machinery   Drink alcohol   Return to work  You will receive specific instructions about eating, activities and medications before you leave.    The above instructions have been reviewed and explained to me by   _______________________    I fully understand and can verbalize these instructions _____________________________ Date  _________

## 2010-02-04 NOTE — Procedures (Signed)
Summary: Esophageal Manometry/[facility]  Esophageal Manometry/[facility]   Imported By: Sherian Rein 03/16/2009 14:40:49  _____________________________________________________________________  External Attachment:    Type:   Image     Comment:   External Document

## 2010-05-18 NOTE — Assessment & Plan Note (Signed)
[facility]                            CARDIOLOGY OFFICE NOTE   NAME:[name], [name] THIEME                         MRN:          [id number]  DATE:03/20/2007                            DOB:          1950-10-18    REFERRING PHYSICIAN:  Trudi Ida. Denton Lank, M.D.   PRIMARY CARE PHYSICIAN:  Ernestina Penna, M.D.   REASON FOR REFERRAL:  Patient's chest pain.   HISTORY OF PRESENT ILLNESS:  The patient is a pleasant 60 year old white  female with a history of chest discomfort.  She had a catheterization in  2003 without any coronary disease.  She had a stress test in April 2008  demonstrating normal EF and no evidence of coronary disease.  She was  most recently in the emergency room with chest discomfort at [facility].  She was sent from [facility] where she presented with some sinus  complaints.  However, had been describing some left-sided discomfort and  radiation down her left arm.  This had been similar to previous  complaints at the time of her stress test and catheterization.  In the  emergency room she ruled out and had no objective evidence of ischemia  and was sent home.  She has continued to have some fleeting discomfort.  However, this has been similar to her usual pattern.  It has not been  similar to any gallbladder problems that she has had in the past.  She  cannot differentiate between her reflux and other chest pains.  She has  been not doing as much walking as she just had her gallbladder removed.  However, with her level of activity, she is not bringing on any of these  complaints.  She denies any shortness of breath and has had no PND or  orthopnea, precipitating palpitations, presyncope or syncope.   PAST MEDICAL HISTORY:  1. Hyperlipidemia.  2. Gastroesophageal reflux disease.  3. Tubal ligation.   ALLERGIES:  Sulfa.   MEDICATIONS:  1. Lipitor 20 mg daily.  2. Omeprazole 40 mg daily.  3. B12/B6.  4. Prevacid 30 mg daily.  5.  Cranberry.  6. Aspirin 81 mg daily.  7. Calcium.   REVIEW OF SYSTEMS:  As stated in the HPI and otherwise negative for  other systems.   PHYSICAL EXAMINATION:  GENERAL:  The patient is in no distress.  VITAL SIGNS:  Blood pressure 125/83, heart 93 and regular.  HEENT:  Eyes are unremarkable.  Pupils equal, round and reactive to  light.  Fundi not visualized.  NECK:  No jugular distention at 45 degrees, carotid upstroke brisk and  symmetrical.  No bruits, thyromegaly.  LUNGS:  Clear to auscultation bilaterally.  HEART:  PMI not displaced or sustained, S1-S2 within normal.  No S3-S4,  clicks, rubs, murmurs.  ABDOMEN:  Flat, positive bowel sounds normal in frequency and pitch, no  bruits, rebound or guarding or midline pulsatile mass, organomegaly.  SKIN:  No rash.  EXTREMITIES:  2+ pulse, no edema.   STUDIES:  EKG sinus rhythm, rate 83, axis within normal.  Intervals  within normal limits, no  acute ST-T wave change.   ASSESSMENT/PLAN:  1. Chest discomfort.  The patient's chest comfort is atypical.  She      has had a complete workup for this in the past.  The symptoms are      similar to the complaints that she had at the time of her      catheterization and her stress test.  There has been no objective      evidence of ischemia.  She is not having any of the more      significant complaints that she was having when she presented to      the emergency room recently.  Therefore, no further cardiovascular      testing is suggested at this point.  She will continue with primary      risk reduction per Dr. Christell Constant.  2. Follow-up will be as needed in this office.     Rollene Rotunda, MD, [facility]  Electronically Signed    JH/MedQ  DD: 03/20/2007  DT: 03/21/2007  Job #: [id number]   cc:   Ernestina Penna, M.D.

## 2010-05-18 NOTE — Assessment & Plan Note (Signed)
[facility]                         GASTROENTEROLOGY OFFICE NOTE   NAME:[name] [name]                           MRN:          [id number]  DATE:11/23/2006                            DOB:          12-05-1950    The patient continues with recurrent right upper quadrant, subxiphoid,  and chest pain of unexplained etiology.  She is on daily Prevacid, but  for insurance purposes she is taking Omeprazole 40 mg a day.  She really  denies true reflux symptoms.   Her right upper quadrant pain seems periodic and somewhat atypical.  She  recently had CT scan of the abdomen per Dr. Christell Constant on October 20, 2006.  This was actually a scan of the abdomen and pelvis and was entirely  normal.  Her last ultrasound examination was performed in January of  2006.   PHYSICAL EXAMINATION:  VITAL SIGNS:  She weighs 116 pounds.  Blood  pressure 112/72, pulse 88 and regular.  I cannot appreciate stigmata of  chronic liver disease, or thyromegaly.  CHEST:  Clear.  HEART:  Regular rhythm without murmurs, rubs, or gallops.  ABDOMEN:  I could not appreciate hepatosplenomegaly, abdominal masses,  or tenderness.  Bowel sounds were normal.  EXTREMITIES:  Peripheral extremities were unremarkable.  </ASSESSMENT and PLAN/<<<  The patient has chronic GERD which seems to be doing well on PPI  therapy.  Her right upper quadrant pain is somewhat atypical and I  suspect she has irritable bowel syndrome, but we will obtain CT scan of  her HIDA to exclude gallbladder dysfunction.  Apparently she does have a  family history of gallbladder disease.  I have asked her to continue all  of her other medications as listed above.     Vania Rea. Jarold Motto, MD, [facility], FAGA  Electronically Signed    DRP/MedQ  DD: 11/23/2006  DT: 11/24/2006  Job #: [id number]   cc:   Ernestina Penna, M.D.

## 2010-05-21 NOTE — Procedures (Signed)
[facility]                              EXERCISE TREADMILL   NAME:[name], [name]                           MRN:          [id number]  DATE:04/26/2006                            DOB:          Jun 21, 1950    PRIMARY CARDIOLOGIST:  Dr. Rollene Rotunda.   PRIMARY CARE PHYSICIAN:  Dr. Rudi Heap.   HISTORY:  [name] is a 60 year old female patient with a history of  normal coronary arteries by catheterization in 2003 who saw Dr. Antoine Poche  on April 11, 2006 with complaints of chest pain. He recommended that the  patient undergo routine exercise treadmill testing to rule out ischemic  heart disease.   EXERCISE TREADMILL TEST:  The patient exercised according to Bruce  protocol for a total of 7 minutes and 18 seconds and achieved a work  level of 8.9 mets. Her resting heart rate went from 108 beats per minute  to a maximum of 160 beats per minute. This represented 98% of her  maximal age predicted heart rate. Resting blood pressure went from  141/84 to a maximum of 151/90. The test was stopped secondary to fatigue  and shortness of breath. She denied chest pain.   Electrocardiogram at baseline revealed sinus rhythm with a heart rate of  82, RSR prime in V1 and V2. With maximal exercise, she had no ST-T wave  changes to suggest ischemia or injury. There are ventricular  arrhythmias.   IMPRESSION:  Clinically an electrically negative exercise treadmill  test.   DISPOSITION:  The patient will followup with Dr. Antoine Poche p.r.n. She  will continue to follow with Dr. Christell Constant and Dr. Jarold Motto as indicated.     Tereso Newcomer, PA-C  Electronically Signed      Rollene Rotunda, MD, [facility]  Electronically Signed   SW/MedQ  DD: 04/26/2006  DT: 04/26/2006  Job #: [id number]   cc:   Ernestina Penna, M.D.  [place], PA-C  Paulita Cradle, NP  Vania Rea. Jarold Motto, MD, [facility], FAGA

## 2010-05-21 NOTE — Assessment & Plan Note (Signed)
[name], [name]                           MRN:          [id number]  DATE:10/12/2005                            DOB:          Feb 27, 1950    [name] is a 60 year old white female, has chronic acid reflux, is really  doing fairly well at this time on Prevacid 30 mg twice a day.  She was  treated with a 2-week Prevpac in early November by Dr. Dimple Casey.  Patient has  had previous endoscopy and esophageal manometry, both of which are fairly  unremarkable.  She denies Raynaud's phenomenon.  Also reviewing her chart,  she had a normal 24-hour pH pro-test on therapy in February of 2006.  She  has some mild constipation which responds to MiraLax therapy, and apparently  had a negative colonoscopy within the last 5 years.  She has no significant  family history of colon cancer or colon polyps.  I do not have her old chart  for review at this time.  She does have hyperlipidemia and is on Lipitor 20  mg a day, Evista daily, and Prevacid 30 mg twice a day.   She currently denies chest pain, dysphagia.  There are no hepatobiliary  complaints.  She had an ultrasound of her gallbladder approximately a year  and a half ago that unremarkable.  Her appetite is good, and her weight is  stable.   EXAMINATION:  GENERAL:  Shows her to be healthy-appearing white female in no  distress.  Appears her stated age.  VITAL SIGNS:  She weighs 110 pounds.  Blood pressure 130/68.  Her pulse was  70 and regular.  I could not appreciate stigmata of chronic liver disease.  CHEST:  Her chest was clear to percussion and auscultation.  She was in a  regular rhythm, without murmurs, gallops or rubs.  ABDOMEN:  Benign without organomegaly, masses or tenderness.   ASSESSMENT:  1. Chronic acid reflux required twice a day, PPI therapy.  2. Chronic functional constipation.  3. History of hyperlipidemia.  4. Status post  treatment for H. Pylori, positive serology.   RECOMMENDATIONS:  1. Reflux regime again reviewed, renewed Prevacid.  2. Patient is to call if she has a relapse of her problem and will      consider follow-up endoscopic exam as suggested by Dr. Dimple Casey.  3. P.r.n. MiraLax and possible Amitza prescription in the future depending      on her clinical      course.  4. Other medication followup with Dr. Dimple Casey as previously planned.       Vania Rea. Jarold Motto, MD, Clementeen [place], [place]      DRP/MedQ  DD:  10/12/2005  DT:  10/13/2005  Job #:  [id number]   cc:   Magnus Sinning. Rice, M.D.

## 2010-05-21 NOTE — H&P (Signed)
. [facility]  Patient:    [name], [name] Visit Number: [id number] MRN: [id number]          Service Type: MED Location: [phone] Attending Physician:  Veneda Melter Dictated by:   Veneda Melter, M.D. [facility] Admit Date:  05/26/2001   CC:         Dietrich Pates, M.D. [facility]  Vania Rea. Jarold Motto, M.D. [facility]  Monica Becton, M.D., [facility]   History and Physical  CHIEF COMPLAINT: Chest pain.  HISTORY OF PRESENT ILLNESS: [name] is a 60 year old white female, with a history of gastroesophageal reflux disease, who presents for assessment of substernal chest discomfort.  The patient has had a long history of GI symptoms and has undergone treatment and endoscopy by Dr. Jarold Motto in the past.  For the past one year she has had intermittent episodes of upper chest pain and underwent assessment by Dr. Tenny Craw approximately one year ago.  On May 04, 2000 she underwent treadmill Cardiolite study, showing no ischemia and well-preserved LV function, and she was thus treated medically.  Recently, however, she has had recurrence of severe pain.  Since Thursday she has not felt well.  She has had upper chest discomfort that has waxed and waned and does not appear to be exacerbated by position or activity.  This appears to be subtly higher than her GI symptoms.  It is not associated with shortness of breath, nausea or vomiting.  She was sufficiently worried that she presented to Dr. Lonell Grandchild office.  She had no significant improvement with Tums or other antacids.  REVIEW OF SYSTEMS: She has noted some recent headaches and some light headedness.  She has been taking a fair amount of ibuprofen.  She has not had any syncope or presyncope.  No orthopnea, paroxysmal nocturnal dyspnea.  No lower extremity edema.  She denies any visual changes.  No dysuria, hematuria, or bright red blood per rectum, melena.  All other systems are negative.  PAST MEDICAL  HISTORY:  1. Gastroesophageal reflux disease.  2. Sinusitis.  3. Tubal ligation.  4. She is 61 years postmenopausal.  5. A treadmill stress test on May 04, 2000 was performed and she exercised     seven minutes and had no clinical symptoms, no ECG changes, and stress     imaging studies showed no ischemia; EF of 70%.  ALLERGIES: SULFA causes GI upset.  MEDICATIONS:  1. Nexium.  2. Premarin.  3. Multivitamin.  4. Fish oil.  5. Aspirin as needed for headaches.  6. Motrin.  SOCIAL HISTORY: She is married and has one child.  She works in Tax adviser at [employer].  She denies use of tobacco or alcohol products.  She has been under some stress lately due to marital problems.  She was separated from her husband in January 2003 and they have since reunited.  FAMILY HISTORY: Mother alive at the age of 61 with a history of hypertension. Father alive at the age of 81 with hypertension.  One sister alive at the age of 20 with hypertension.  PHYSICAL EXAMINATION:  GENERAL: She is a well-developed, well-nourished white female in no acute distress.  VITAL SIGNS: Blood pressure 140/77, pulse 83, respirations 18.  O2 saturation 100% on room air.  Temperature 97.9 degrees.  HEENT: PERRL.  EOMI.  Oropharynx shows no lesions.  NECK: Supple.  Carotid upstrokes are brisk, no bruits.  No adenopathy.  HEART: Regular rate without murmurs.  LUNGS:  Clear to auscultation.  ABDOMEN: Soft, nontender.  EXTREMITIES: No peripheral edema.  NEUROLOGIC: Motor strength is 5/5.  Sensory is intact to touch.  Peripheral pulses are 2+ and equal bilaterally.  SKIN: No rashes.  LABORATORY DATA: EKG is normal sinus at 77, there is poor R wave progression, possibly due to misplaced lead; no acute ST-T wave changes are noted.  Chest x-ray shows normal cardiac silhouette, no acute infiltrates or effusions.  WBC 7.4, hemoglobin 11.8, hematocrit 34.8; platelets 269,000.  Sodium 140, potassium  3.4, chloride 107, bicarbonate 28, BUN 13, creatinine 0.7, glucose 107.  PT is 12.3, INR 0.9.  Initial CK is 66.  ASSESSMENT/PLAN: [name] is a 60 year old female, who presents with severe upper sternal chest discomfort of unclear etiology.  The patient has a history of esophageal reflux disease; however, this appears to be more severe and somewhat different from this prior pain.  Despite negative stress imaging study a year ago we are still concerned about potential risk of coronary artery disease and will admit the patient and obtain serial cardiac enzymes, and also check a lipid profile and observe her on telemetry for any dysrhythmia.  Considerations also include peptic ulcer disease with reflux and possibly pulmonary embolus.  We will start her on her H2 blocker and consider further gastrointestinal evaluation should our initial work-up prove noncontributory.  We will also obtain a spiral CT to rule out pulmonary embolus, although I doubt this given she has had no respiratory compromise. We will plan on proceeding with coronary angiography to define her anatomy and determine if she has any underlying disease.  The risks and benefits and alternatives of this were discussed with the patient and her husband and they understand and agree to proceed. Dictated by:   Veneda Melter, M.D. [facility] Attending Physician:  Veneda Melter DD:  05/26/01 TD:  05/27/01 Job: [id number]

## 2010-05-21 NOTE — Assessment & Plan Note (Signed)
[facility]                         GASTROENTEROLOGY OFFICE NOTE   NAME:FRYEMaddux, First                           MRN:          [id number]  DATE:01/20/2006                            DOB:          Apr 24, 1950    [name] continues with burning substernal chest pain despite  taking Prevacid 30 mg at bedtime and omeprazole 2 in the morning.  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  Twenty-four  hour pH probe on therapy was very unimpressive and showed no significant  acid reflux.  When I last saw this patient in October of this year, she  was doing well on Prevacid 30 mg twice a day, and also had been treated  with a 2 week course of Prevpac in November by Dr. Montey Hora.  Patient has no history of Raynaud's phenomena or other collagen vascular  diseases.   Patient denies any hepatobiliary complaints, and last had a gallbladder  ultrasound apparently done within the last 2 years, which has been  normal.  She has no current dysphagia or odynophagia, and denies recent  antibiotic use.  She, in addition to her PPIs, is on Evista 60 mg daily,  and Lipitor 20 mg a day, and a variety of multivitamins.   She, in the past, has had REACTIONS TO SULFUR MEDICATIONS.   PHYSICAL EXAMINATION:  She is a healthy-appearing white female appearing  younger than her stated age.  She weighs 113 pounds, which is up some 10  pounds from her normal weight.  Blood pressure is 120/72 and pulse was  64 and regular.  I could not appreciate stigmata of chronic liver disease.  Her chest was clear and she appeared to be in a regular rhythm without  murmurs, gallops, or rubs.  There was no hepatosplenomegaly, abdominal masses or tenderness noted.  Bowel sounds were normal.  RECTAL EXAM:  Deferred.  Peripheral extremities  were unremarkable.  Mental status was clear.   ASSESSMENT:  [name] has refractory acid reflux despite heavy acid  suppressive therapy.  She had previous thorough gastrointestinal work up  with no evidence of esophageal motility disorder.  Some of her acid  reflux may have been exacerbated by Helicobacter pylori treatment, which  has rid her of Helicobacter pylori but has elevated her acid levels.   RECOMMENDATIONS:  1. Outpatient followup endoscopic exam.  2. Continue current regimen with postprandial and nocturnal Carafate      suspension treatment.  3. Consider referral for surgical fundoplication.  4. Other medications as per her doctors at [facility].  5. Continue nightly MiraLax for chronic constipation.     [name] Rea. Jarold Motto, MD, Caleen Essex, FAGA  Electronically Signed    DRP/MedQ  DD: 01/20/2006  DT: 01/20/2006  Job #: [id number]   cc:   [facility]

## 2010-05-21 NOTE — Cardiovascular Report (Signed)
[place]  Patient:    [name], [name] Visit Number: [id number] MRN: [id number]          Service Type: MED Location: [phone] Attending Physician:  Veneda Melter Dictated by:   Arturo Morton. Riley Kill, M.D. [facility] Proc. Date: 05/28/01 Admit Date:  05/26/2001 Discharge Date: 05/29/2001   CC:         Monica Becton, M.D.  Dietrich Pates, M.D. [facility]   Cardiac Catheterization  INDICATIONS: The patient is a 60 year old woman who has had some recurrent chest pain despite aggressive treatment of gastroesophageal reflux. She was seen and admitted and her enzymes were negative. ECG was unremarkable. The CT of the chest did not reveal a mass or lung clot. She was therefore brought to the catheterization lab for further evaluation.  PROCEDURES: 1. Left heart catheterization. 2. Selective coronary arteriography. 3. Selective left ventriculography.  DESCRIPTION OF PROCEDURE: The procedure was performed from the right femoral artery using 6 French catheters.  She tolerated the procedure well. Because of some damping in the right coronary ostium, we elected to give some intracoronary nitroglycerin. The nitroglycerin actually was given in the intraaortic area with a fall of the blood pressure into the 90s range. This gradually came up at the end of the case. She tolerated all of this without complication.  She was taken to the holding area in satisfactory clinical condition.  HEMODYNAMICS: 1. Central aorta 122/71. 2. Left ventricular 83/2. 3. No gradient on pullback across the aortic valve.  ANGIOGRAPHIC DATA: 1. Ventriculography was performed in the RAO projection. Overall, ejection    fraction was 63%. No segmental abnormalities contraction were identified.    Overall, left ventricular function was normal. 2. The left main coronary artery was free of critical disease. 3. The left anterior descending artery coursed to the apex. There was    one major  diagonal branch which bifurcated distally. The LAD and its    branches were free of significant disease. 4. The circumflex provided two marginal branches. Overall, the circumflex    appeared to be smooth and without significant narrowing. 5. The right coronary artery had an anterior takeoff. It provided a posterior    descending and two posterolateral branches. The RCA was smooth throughout.  CONCLUSIONS: 1. Preserved left ventricular function. 2. No high-grade areas of focal coronary stenoses.  DISPOSITION: The patient will be discharged either tonight or in the morning. Followup will be with Dr. Christell Constant. Dictated by:   Arturo Morton Riley Kill, M.D. [facility] Attending Physician:  Veneda Melter DD:  05/28/01 TD:  05/29/01 Job: [id number]

## 2010-05-21 NOTE — Discharge Summary (Signed)
[place]  Patient:    [name], [name] Visit Number: [id number] MRN: [id number]          Service Type: MED Location: [phone] Attending Physician:  Veneda Melter Dictated by:   Delton See, P.A. Admit Date:  05/26/2001 Discharge Date: 05/29/2001   CC:         Monica Becton, M.D.   Referring Physician Discharge [facility]  DATE OF BIRTH:  02-04-2050  HISTORY OF PRESENT ILLNESS:  This is a 60 year old female who presented to [facility] for evaluation of severe chest pain which started last Thursday and was described as intermittent.  She was referred for evaluation by [facility].  She had previously had a Cardiolite on May 04, 2000, that was negative for ischemia and ejection fraction 57%.  She was admitted to [facility] through the emergency room on May 26, 2001, for further evaluation of her symptoms.  PAST MEDICAL HISTORY:  Significant for: 1. Gastroesophageal reflux disease. 2. History of sinusitis.  PAST SURGICAL HISTORY: 1. Previous Cardiolite. 2. History of a tubal ligation.  ALLERGIES:  SULFA MEDICATIONS cause GI upset.  MEDICATIONS PRIOR TO ADMISSION: 1. Nexium. 2. Premarin. 3. Multivitamins. 4. Aspirin p.r.n. 5. Fish oil. 6. Occasional ibuprofen.  SOCIAL HISTORY:  The patient does not use alcohol or tobacco.  She is married. She has one child.  She works at [employer].  She has a lot of stress.  She separated from her husband in January.  FAMILY HISTORY:  Her mother is alive at age 67 with hypertension.  Her father is alive at age 76 with hypertension.  She has one sister, 54, with hypertension.  HOSPITAL COURSE:  As noted, the patient was admitted to [facility] for further evaluation of chest pain.  She was noted to be hypokalemic at the time of admission.  This was supplemented.  She was also noted to be mildly anemic at the time of  admission and iron studies were performed which revealed low iron saturation.  The patient ultimately underwent cardiac catheterization on March 28, 2001, performed by Arturo Morton. Riley Kill, M.D.  The coronary arteries were found to be normal with no significant lesions.  The left ventricle was normal with an ejection fraction of 63%.  Arrangements were made to discharge the patient the following day in improved condition.  LABORATORY DATA:  A CBC on the day of discharge revealed a hemoglobin of 12.2, hematocrit 35.5, WBC 7900, and platelets 90,000.  The patient had a CT scan of the chest during her stay which was negative for pulmonary embolus.  Iron studies revealed iron 57, total iron binding capacity 492, and percent saturation low at 12%.  A follow-up chemistry profile on May 28, 2001, revealed BUN 12, creatinine 0.7, potassium 3.7, and the remainder was normal. Cardiac enzymes were negative x 3.  The lipid profile revealed the cholesterol to be high at 239, triglycerides 192, HDL 61, and LDL 140.  The TSH was 1.517.  The chest x-ray showed no active disease.  An EKG showed normal sinus rhythm, low voltage, and could not rule out anterior infarct, age undetermined.  DISCHARGE MEDICATIONS:  The patient was told to take the same medications she was on prior to admission, including Nexium, Premarin, and vitamins.  Veneda Melter, M.D., has suggested possibly a trial of SSRI.  The patient also may benefit from iron supplementation therapy.  He felt that possibly her  Premarin should be discontinue.  We will leave these medication decisions to her primary care physicians.  She was told she could take Tylenol as needed for pain.  ACTIVITY:  To be as tolerated, but nothing strenuous and no driving for two days.  She was told that she could return to work on Thursday, May 29, 2001.  DIET:  She is to be on a low-cholesterol, low-fat diet.  WOUND CARE:  She was to call the office if she had any  increased pain, swelling, or bleeding from her groin.  FOLLOW-UP:  She was to follow up with Dietrich Pates, M.D., in the office in one to two weeks.  She was told to follow up with [facility] and to call for an appointment.  PROBLEM LIST AT THE TIME OF DISCHARGE: 1. Chest pain.  Negative cardiac enzymes.  Negative CT scan of the chest. 2. Cardiac catheterization on May 28, 2001, with normal coronary arteries and    normal left ventricle. 3. Elevated cholesterol and elevated low-density lipoprotein.  The patient was    encouraged to stay on a low-fat, low-cholesterol diet. 4. Mild hypotension felt secondary to Lopressor used this admission. 5. Mild anemia with further follow-up recommended. 6. Hypokalemia supplemented and improved. 7. History of gastroesophageal reflux disease treated with proton pump    inhibitor. Dictated by:   Delton See, P.A. Attending Physician:  Veneda Melter DD:  05/29/01 TD:  05/29/01 Job: [id number]

## 2010-05-21 NOTE — Op Note (Signed)
NAMENOELE, ICENHOUR                  ACCOUNT NO.:  [id number]   MEDICAL RECORD NO.:  [id number]          PATIENT TYPE:  AMB   LOCATION:  ENDO                         FACILITY:  [facility]   PHYSICIAN:  Vania Rea. Jarold Motto, M.D. [facility] OF BIRTH:  [birth year]   DATE OF PROCEDURE:  03/02/2004  DATE OF DISCHARGE:  03/02/2004                                 OPERATIVE REPORT   PROCEDURES:  1.  Esophageal manometry.  2.  24-hr pH probe test and report.   ESOPHAGEAL MANOMETRY:   DESCRIPTION OF PROCEDURE:  Esophageal manometry was completed without  difficulty.  This was somewhat difficult study to interpret, because of a  previously noted discrete baseline value for the lower esophageal  recordings, and the upper esophageal sphincter pressures were fully  recorded.  However, it appears to be read as follows:   1.  Upper esophageal sphincter -- pressures are probably normal in the upper      esophageal sphincter.  2.  Lower esophageal sphincter pressure appears to be borderline normal at      15 mmHg, with normal relaxation and swallowing.  3.  Motility pattern -- normally propagated peristaltic waves, both wet and      dry swallows throughout the length of the esophagus.  Mean amplitude at      a duration of 75 mmHg.   ASSESSMENT:  This is probably a normal esophageal manometry, with a  borderline LAS pressure however.   PROCEDURE:  24-hr pH Probe Testing Report.   The 24-hr pH probe testing was performed without difficulty.  Probes were  placed in both the proximal and distal esophagus.  Total DeMeester score was  2.6 (normal less than 22).  There was no significant acid reflux in either  the proximal or distal probe throughout the length of this study.      DRP/MEDQ  D:  03/09/2004  T:  03/09/2004  Job:  [id number]

## 2010-05-21 NOTE — Assessment & Plan Note (Signed)
[facility]                            CARDIOLOGY OFFICE NOTE   NAME:[name], [name]                           MRN:          [id number]  DATE:04/11/2006                            DOB:          1950/05/14    PRIMARY CARE PHYSICIAN:  Ernestina Penna, M.D.   REASON FOR PRESENTATION:  Evaluate patient for chest pain.   HISTORY OF PRESENT ILLNESS:  The patient presents for the evaluation of  chest discomfort.  She had a cardiac catheterization in 2003.  This  demonstrated an ejection fraction of 63%.  She had no coronary artery  disease.  She has had lots of GI problems over time, with reflux.  She  has had an esophageal stricture and last had a stretching in January  2008.  She is getting a burning discomfort.  This seems to be constant  throughout the day.  It wakes her up at night, which is new.  She has  also had discomfort radiating down her left arm.  She has not been  improved with her usual regimen.  She is not having diaphoresis.  She is  not having any jaw discomfort.  She is not able to exacerbate this with  activity.  It is a moderate discomfort.  She saw her primary care  physician and had an electrocardiogram without acute changes, but was  referred here to rule out any cardiac etiology.   PAST MEDICAL HISTORY:  1. Hyperlipidemia.  2. Gastroesophageal reflux disease.   PAST SURGICAL HISTORY:  Tubal ligation.   ALLERGIES:  SULFA.   CURRENT MEDICATIONS:  1. Omeprazole 20 mg daily.  2. Vitamin B6 and B12 and fish oil.  3. Aspirin 81 mg daily.  4. Lipitor 20 mg daily.  5. Evista 60 mg daily.  6. Prevacid 30 mg daily.  7. Calcium, magnesium and zinc.   SOCIAL HISTORY:  The patient never smoked cigarettes.  She occasionally  drinks alcohol.  She is an Haematologist.  She is married  with one child.   FAMILY HISTORY:  Noncontributory for early coronary artery disease.  Her  mother did have apparent angioplasty at a later  age.   REVIEW OF SYSTEMS:  Positive for headaches and dizziness, reflux,  diarrhea and constipation.  Otherwise negative for other systems.   PHYSICAL EXAMINATION:  GENERAL:  The patient is in no distress.  VITAL SIGNS:  Blood pressure 130/83, heart rate 84 and regular.  HEENT:  Eyes unremarkable.  Pupils equal, round, react to light.  Fundi  not visualized.  Oral mucosa unremarkable.  NECK:  No jugular venous distention.  Wave form within normal limits.  Carotid upstroke brisk and symmetric.  No bruits or thyromegaly.  LYMPHATICS:  No cervical, axillary or inguinal adenopathy.  LUNGS:  Clear to auscultation bilaterally.  BACK:  No costovertebral angle tenderness.  CHEST:  Unremarkable.  HEART:  PMI not displaced or sustained.  S1 and S2 within normal limits.  No S3, no S4.  No clicks, rubs or murmurs.  ABDOMEN:  Flat with positive bowel sounds, normal in frequency and  pitch.  No bruits, rebound, guarding, no midline pulsatile mass, no  organomegaly.  SKIN:  No rashes, no nodules.  EXTREMITIES:  With 2+ pulses throughout.  No edema, no cyanosis.  NEUROLOGIC:  Oriented to person, place and time.  Cranial nerves II-XII  intact.  Motor grossly intact.   Electrocardiogram:  Sinus tachycardia, RSR prime in V1 and V2, no acute  ST-T wave changes.   ASSESSMENT/PLAN:  Chest:  The patient's chest's discomfort is atypical.  She had no coronary artery disease on cardiac catheterization in the  past.  I think the pre-test probability of obstructive coronary disease  is low.  I think screening with an exercise treadmill test would be most  beneficial.  If this is negative, then no further cardiac workup would  be planned.   FOLLOWUP:  Will be at the time of her exercise treadmill.     Rollene Rotunda, MD, [facility]  Electronically Signed    JH/MedQ  DD: 04/11/2006  DT: 04/11/2006  Job #: [id number]   cc:   Ernestina Penna, M.D.

## 2010-05-28 ENCOUNTER — Encounter: Payer: Self-pay | Admitting: Nurse Practitioner

## 2010-06-16 ENCOUNTER — Other Ambulatory Visit: Payer: Self-pay | Admitting: Obstetrics and Gynecology

## 2010-06-16 DIAGNOSIS — Z1231 Encounter for screening mammogram for malignant neoplasm of breast: Secondary | ICD-10-CM

## 2010-07-08 ENCOUNTER — Other Ambulatory Visit (HOSPITAL_COMMUNITY)
Admission: RE | Admit: 2010-07-08 | Discharge: 2010-07-08 | Disposition: A | Discharge status: Home / Self Care | Payer: 59 | Source: Ambulatory Visit | Attending: Obstetrics and Gynecology | Admitting: Obstetrics and Gynecology

## 2010-07-08 ENCOUNTER — Ambulatory Visit (HOSPITAL_COMMUNITY)
Admission: RE | Admit: 2010-07-08 | Discharge: 2010-07-08 | Disposition: A | Discharge status: Home / Self Care | Payer: 59 | Source: Ambulatory Visit | Attending: Obstetrics and Gynecology | Admitting: Obstetrics and Gynecology

## 2010-07-08 ENCOUNTER — Other Ambulatory Visit: Payer: Self-pay | Admitting: Obstetrics and Gynecology

## 2010-07-08 ENCOUNTER — Encounter (INDEPENDENT_AMBULATORY_CARE_PROVIDER_SITE_OTHER): Payer: 59 | Admitting: Obstetrics and Gynecology

## 2010-07-08 DIAGNOSIS — Z1231 Encounter for screening mammogram for malignant neoplasm of breast: Secondary | ICD-10-CM | POA: Insufficient documentation

## 2010-07-08 DIAGNOSIS — Z01419 Encounter for gynecological examination (general) (routine) without abnormal findings: Secondary | ICD-10-CM

## 2010-07-08 DIAGNOSIS — Z124 Encounter for screening for malignant neoplasm of cervix: Secondary | ICD-10-CM | POA: Insufficient documentation

## 2010-07-08 DIAGNOSIS — R82998 Other abnormal findings in urine: Secondary | ICD-10-CM

## 2010-08-24 DIAGNOSIS — M8580 Other specified disorders of bone density and structure, unspecified site: Secondary | ICD-10-CM | POA: Insufficient documentation

## 2010-08-25 ENCOUNTER — Ambulatory Visit (INDEPENDENT_AMBULATORY_CARE_PROVIDER_SITE_OTHER): Payer: 59 | Admitting: Gynecology

## 2010-08-25 DIAGNOSIS — M949 Disorder of cartilage, unspecified: Secondary | ICD-10-CM

## 2010-08-25 DIAGNOSIS — M899 Disorder of bone, unspecified: Secondary | ICD-10-CM

## 2010-08-26 ENCOUNTER — Other Ambulatory Visit: Payer: Self-pay

## 2010-08-26 DIAGNOSIS — M949 Disorder of cartilage, unspecified: Secondary | ICD-10-CM

## 2010-09-21 ENCOUNTER — Encounter: Payer: Self-pay | Admitting: Obstetrics and Gynecology

## 2010-09-24 LAB — I-STAT 8, (EC8 V) (CONVERTED LAB)
Acid-Base Excess: 3 — ABNORMAL HIGH
Bicarbonate: 24.8 — ABNORMAL HIGH
Glucose, Bld: 109 — ABNORMAL HIGH
HCT: 43
Hemoglobin: 14.6
Operator id: 294521
Potassium: 3.5
Sodium: 139
TCO2: 26

## 2010-09-24 LAB — POCT I-STAT CREATININE
Creatinine, Ser: 0.9
Operator id: 294521

## 2010-09-24 LAB — POCT CARDIAC MARKERS: CKMB, poc: <1 — ABNORMAL LOW

## 2011-02-16 ENCOUNTER — Encounter: Payer: Self-pay | Admitting: Gastroenterology

## 2011-02-19 ENCOUNTER — Telehealth: Payer: Self-pay | Admitting: Gastroenterology

## 2011-02-19 NOTE — Telephone Encounter (Signed)
On call note @ 0945. Pt starting Zpack for sinus infection and concerned she will be C diff again. Advised her to start Florastor bid, take with Zpack and then for 2 additional weeks. Call Dr. Jarold Motto if she has any GI problems.

## 2011-03-16 ENCOUNTER — Telehealth: Payer: Self-pay | Admitting: Gastroenterology

## 2011-03-16 NOTE — Telephone Encounter (Signed)
Pt with hx of hyperlipidemia, external hemorrhoids, esophageal stricture, HH, dysphagia, IBS, GERD. Last OV 08/27/09 for f/u post diarrhea thought to be d/t Z pack induced CDIFF- lactoferrin +, - cdiff and pt was given Vanc and Florastor. On 02/19/11 pt called Dr Russella Dar because she was on a Zpack and was worried about recurring CDIFF. Today, pt reports she finished the Zpack and Florastor for 2 weeks afterward, then she got sick again with congestion. She is now on Amoxicillin and Florastor BID. This am she had 2 stools that were watery and she doesn't want CDIFF again. Please advise.

## 2011-03-16 NOTE — Telephone Encounter (Signed)
c diff toxin,stool, and start florstar bid for 2 weeks

## 2011-03-16 NOTE — Telephone Encounter (Signed)
lmom for pt to call back

## 2011-03-17 NOTE — Telephone Encounter (Signed)
Spoke with pt to inform her Dr Jarold Motto wants to order stool cultures for CDIFF, etc.  Pt reports she hasn't had any other diarrhea stools since the 2 reported yesterday. She will continue the [facility] and call for further problems.

## 2011-03-17 NOTE — Telephone Encounter (Signed)
lmom for pt to call back

## 2011-05-20 ENCOUNTER — Other Ambulatory Visit: Payer: Self-pay | Admitting: Obstetrics and Gynecology

## 2011-05-20 DIAGNOSIS — Z1231 Encounter for screening mammogram for malignant neoplasm of breast: Secondary | ICD-10-CM

## 2011-07-12 ENCOUNTER — Ambulatory Visit (INDEPENDENT_AMBULATORY_CARE_PROVIDER_SITE_OTHER): Payer: 59 | Admitting: Obstetrics and Gynecology

## 2011-07-12 ENCOUNTER — Ambulatory Visit (HOSPITAL_COMMUNITY)
Admission: RE | Admit: 2011-07-12 | Discharge: 2011-07-12 | Disposition: A | Discharge status: Home / Self Care | Payer: 59 | Source: Ambulatory Visit | Attending: Obstetrics and Gynecology | Admitting: Obstetrics and Gynecology

## 2011-07-12 ENCOUNTER — Encounter: Payer: Self-pay | Admitting: Obstetrics and Gynecology

## 2011-07-12 ENCOUNTER — Other Ambulatory Visit (HOSPITAL_COMMUNITY)
Admission: RE | Admit: 2011-07-12 | Discharge: 2011-07-12 | Disposition: A | Discharge status: Home / Self Care | Payer: 59 | Source: Ambulatory Visit | Attending: Obstetrics and Gynecology | Admitting: Obstetrics and Gynecology

## 2011-07-12 VITALS — BP 118/74 | Ht 59.0 in | Wt 111.0 lb

## 2011-07-12 DIAGNOSIS — Z01419 Encounter for gynecological examination (general) (routine) without abnormal findings: Secondary | ICD-10-CM

## 2011-07-12 DIAGNOSIS — N879 Dysplasia of cervix uteri, unspecified: Secondary | ICD-10-CM | POA: Insufficient documentation

## 2011-07-12 DIAGNOSIS — Z1231 Encounter for screening mammogram for malignant neoplasm of breast: Secondary | ICD-10-CM | POA: Insufficient documentation

## 2011-07-12 NOTE — Addendum Note (Signed)
Addended by: Dayna Barker on: 07/12/2011 02:35 PM   Modules accepted: Orders

## 2011-07-12 NOTE — Progress Notes (Signed)
Patient came to see me today for her annual GYN exam. She is doing well without menopausal symptoms. She is sexually active without dyspareunia or dryness. She is having no vaginal bleeding. She is having no pelvic pain. She had her mammogram today. She has had a colonoscopy. She does her lab work through her PCP. She has osteopenia on bone density without an elevated FRAX risk. She has had no fractures. Her last bone density was last year. 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 She has had normal Paps since then. Her last Pap was 2012.  Physical examination: Kennon Portela present. HEENT within normal limits. Neck: Thyroid not large. No masses. Supraclavicular nodes: not enlarged. Breasts: Examined in both sitting and lying  position. No skin changes and no masses. Abdomen: Soft no guarding rebound or masses or hernia. Pelvic: External: Within normal limits. BUS: Within normal limits. Vaginal:within normal limits. Fair   estrogen effect. No evidence of cystocele rectocele or enterocele. Cervix: clean. Uterus: Normal size and shape. Adnexa: No masses. Rectovaginal exam: Confirmatory and negative. Extremities: Within normal limits.  Assessment: Asymptomatic atrophic vaginitis. Osteopenia.  Plan: Continue yearly mammograms. Bone density next year.The new Pap smear guidelines were discussed with the patient. Patient still requested pap which was done.

## 2011-07-13 LAB — URINALYSIS W MICROSCOPIC + REFLEX CULTURE
Bilirubin Urine: NEGATIVE
Casts: NONE SEEN
Crystals: NONE SEEN
Specific Gravity, Urine: 1.018 (ref 1.005–1.030)
Squamous Epithelial / LPF: NONE SEEN
Urobilinogen, UA: 0.2 mg/dL (ref 0.0–1.0)
pH: 6.5 (ref 5.0–8.0)

## 2011-07-20 ENCOUNTER — Other Ambulatory Visit: Payer: Self-pay | Admitting: Physician Assistant

## 2012-05-16 ENCOUNTER — Telehealth: Payer: Self-pay | Admitting: Family Medicine

## 2012-05-16 ENCOUNTER — Ambulatory Visit (INDEPENDENT_AMBULATORY_CARE_PROVIDER_SITE_OTHER): Payer: 59 | Admitting: Physician Assistant

## 2012-05-16 ENCOUNTER — Encounter: Payer: Self-pay | Admitting: Physician Assistant

## 2012-05-16 VITALS — BP 133/84 | HR 83 | Temp 97.4°F | Ht 60.0 in | Wt 112.0 lb

## 2012-05-16 DIAGNOSIS — M545 Low back pain: Secondary | ICD-10-CM

## 2012-05-16 MED ORDER — PREDNISONE (PAK) 10 MG PO TABS: 10.0000 mg | ORAL_TABLET | Freq: Every day | ORAL | Status: DC

## 2012-05-16 NOTE — Telephone Encounter (Signed)
appt given with Annette Stable

## 2012-05-16 NOTE — Patient Instructions (Signed)
Back Pain, Adult Low back pain is very common. About 1 in 5 people have back pain.The cause of low back pain is rarely dangerous. The pain often gets better over time.About half of people with a sudden onset of back pain feel better in just 2 weeks. About 8 in 10 people feel better by 6 weeks.  CAUSES Some common causes of back pain include:  Strain of the muscles or ligaments supporting the spine.  Wear and tear (degeneration) of the spinal discs.  Arthritis.  Direct injury to the back. DIAGNOSIS Most of the time, the direct cause of low back pain is not known.However, back pain can be treated effectively even when the exact cause of the pain is unknown.Answering your caregiver's questions about your overall health and symptoms is one of the most accurate ways to make sure the cause of your pain is not dangerous. If your caregiver needs more information, he or she may order lab work or imaging tests (X-rays or MRIs).However, even if imaging tests show changes in your back, this usually does not require surgery. HOME CARE INSTRUCTIONS For many people, back pain returns.Since low back pain is rarely dangerous, it is often a condition that people can learn to manageon their own.   Remain active. It is stressful on the back to sit or stand in one place. Do not sit, drive, or stand in one place for more than 30 minutes at a time. Take short walks on level surfaces as soon as pain allows.Try to increase the length of time you walk each day.  Do not stay in bed.Resting more than 1 or 2 days can delay your recovery.  Do not avoid exercise or work.Your body is made to move.It is not dangerous to be active, even though your back may hurt.Your back will likely heal faster if you return to being active before your pain is gone.  Pay attention to your body when you bend and lift. Many people have less discomfortwhen lifting if they bend their knees, keep the load close to their bodies,and  avoid twisting. Often, the most comfortable positions are those that put less stress on your recovering back.  Find a comfortable position to sleep. Use a firm mattress and lie on your side with your knees slightly bent. If you lie on your back, put a pillow under your knees.  Only take over-the-counter or prescription medicines as directed by your caregiver. Over-the-counter medicines to reduce pain and inflammation are often the most helpful.Your caregiver may prescribe muscle relaxant drugs.These medicines help dull your pain so you can more quickly return to your normal activities and healthy exercise.  Put ice on the injured area.  Put ice in a plastic bag.  Place a towel between your skin and the bag.  Leave the ice on for 15 to 20 minutes, 3 to 4 times a day for the first 2 to 3 days. After that, ice and heat may be alternated to reduce pain and spasms.  Ask your caregiver about trying back exercises and gentle massage. This may be of some benefit.  Avoid feeling anxious or stressed.Stress increases muscle tension and can worsen back pain.It is important to recognize when you are anxious or stressed and learn ways to manage it.Exercise is a great option. SEEK MEDICAL CARE IF:  You have pain that is not relieved with rest or medicine.  You have pain that does not improve in 1 week.  You have new symptoms.  You are generally   not feeling well. SEEK IMMEDIATE MEDICAL CARE IF:   You have pain that radiates from your back into your legs.  You develop new bowel or bladder control problems.  You have unusual weakness or numbness in your arms or legs.  You develop nausea or vomiting.  You develop abdominal pain.  You feel faint. Document Released: 12/20/2004 Document Revised: 06/21/2011 Document Reviewed: 05/10/2010 ExitCare Patient Information 2013 [employer].  

## 2012-05-16 NOTE — Progress Notes (Signed)
Subjective:     Patient ID: [name], female   DOB: January 19, 1950, 62 y.o.   MRN: [id number]  Back Pain This is a new problem. The current episode started 1 to 4 weeks ago. The problem occurs constantly. The problem is unchanged. The pain is present in the lumbar spine. The quality of the pain is described as aching and burning. The pain radiates to the left knee, left thigh and left foot. The pain is at a severity of 4/10. The pain is moderate. The pain is the same all the time. The symptoms are aggravated by bending and position. Stiffness is present all day. Associated symptoms include leg pain and tingling. Pertinent negatives include no numbness, paresis or weakness. Risk factors: Hx of same 2 yrs ago. She has tried analgesics, heat and NSAIDs for the symptoms. The treatment provided no relief.     Review of Systems  Musculoskeletal: Positive for back pain.  Neurological: Positive for tingling. Negative for weakness and numbness.  All other systems reviewed and are negative.       Objective:   Physical Exam  Musculoskeletal:       Lumbar back: She exhibits tenderness. She exhibits normal range of motion, no bony tenderness, no swelling, no edema, no deformity and no spasm.       Right upper leg: Normal.       Left upper leg: Normal.  Neurological: She has normal strength and normal reflexes.  Reflex Scores:      Patellar reflexes are 2+ on the right side and 2+ on the left side.      Achilles reflexes are 2+ on the right side and 2+ on the left side. SLR neg       Assessment:     1. Lumbago        Plan:     !2 day dose pack rx Heat/Ice Gentle stretching Decrease riding for now F/u prn

## 2012-05-22 ENCOUNTER — Telehealth: Payer: Self-pay | Admitting: Physician Assistant

## 2012-05-22 ENCOUNTER — Encounter: Payer: Self-pay | Admitting: Physician Assistant

## 2012-05-22 ENCOUNTER — Ambulatory Visit (INDEPENDENT_AMBULATORY_CARE_PROVIDER_SITE_OTHER): Payer: 59 | Admitting: Physician Assistant

## 2012-05-22 VITALS — BP 129/77 | HR 82 | Temp 97.2°F | Ht 60.0 in | Wt 112.0 lb

## 2012-05-22 DIAGNOSIS — R300 Dysuria: Secondary | ICD-10-CM

## 2012-05-22 LAB — POCT UA - MICROSCOPIC ONLY
Bacteria, U Microscopic: NEGATIVE
Crystals, Ur, HPF, POC: NEGATIVE
Epithelial cells, urine per micros: NEGATIVE

## 2012-05-22 LAB — POCT URINALYSIS DIPSTICK
Bilirubin, UA: NEGATIVE
Glucose, UA: NEGATIVE
Spec Grav, UA: 1.02

## 2012-05-22 NOTE — Telephone Encounter (Signed)
APPT WAS GIVEN

## 2012-05-22 NOTE — Progress Notes (Signed)
Subjective:     Patient ID: [name], female   DOB: June 17, 1950, 62 y.o.   MRN: [id number]  Dysuria  This is a new problem. The current episode started yesterday. The problem occurs intermittently. The problem has been gradually improving. The quality of the pain is described as burning. The pain is at a severity of 3/10. The pain is mild. There has been no fever. She is sexually active. There is no history of pyelonephritis. Associated symptoms include frequency. She has tried increased fluids for the symptoms. The treatment provided mild relief. Her past medical history is significant for kidney stones.     Review of Systems  Genitourinary: Positive for dysuria and frequency.  All other systems reviewed and are negative.       Objective:   Physical Exam  Nursing note and vitals reviewed. Abdominal: Soft. Bowel sounds are normal. There is tenderness in the right lower quadrant, suprapubic area and left lower quadrant. There is no rigidity, no rebound, no guarding and no CVA tenderness.       Assessment:     1. Dysuria        Plan:     ? Possible renal stone- fluid intake OTC AZO Further studies if sx cont

## 2012-05-22 NOTE — Patient Instructions (Signed)
Ureteral Colic (Kidney Stones) Ureteral colic is the result of a condition when kidney stones form inside the kidney. Once kidney stones are formed they may move into the tube that connects the kidney with the bladder (ureter). If this occurs, this condition may cause pain (colic) in the ureter.  CAUSES  Pain is caused by stone movement in the ureter and the obstruction caused by the stone. SYMPTOMS  The pain comes and goes as the ureter contracts around the stone. The pain is usually intense, sharp, and stabbing in character. The location of the pain may move as the stone moves through the ureter. When the stone is near the kidney the pain is usually located in the back and radiates to the belly (abdomen). When the stone is ready to pass into the bladder the pain is often located in the lower abdomen on the side the stone is located. At this location, the symptoms may mimic those of a urinary tract infection with urinary frequency. Once the stone is located here it often passes into the bladder and the pain disappears completely. TREATMENT   Your caregiver will provide you with medicine for pain relief.  You may require specialized follow-up X-rays.  The absence of pain does not always mean that the stone has passed. It may have just stopped moving. If the urine remains completely obstructed, it can cause loss of kidney function or even complete destruction of the involved kidney. It is your responsibility and in your interest that X-rays and follow-ups as suggested by your caregiver are completed. Relief of pain without passage of the stone can be associated with severe damage to the kidney, including loss of kidney function on that side.  If your stone does not pass on its own, additional measures may be taken by your caregiver to ensure its removal. HOME CARE INSTRUCTIONS   Increase your fluid intake. Water is the preferred fluid since juices containing vitamin C may acidify the urine making it  less likely for certain stones (uric acid stones) to pass.  Strain all urine. A strainer will be provided. Keep all particulate matter or stones for your caregiver to inspect.  Take your pain medicine as directed.  Make a follow-up appointment with your caregiver as directed.  Remember that the goal is passage of your stone. The absence of pain does not mean the stone is gone. Follow your caregiver's instructions.  Only take over-the-counter or prescription medicines for pain, discomfort, or fever as directed by your caregiver. SEEK MEDICAL CARE IF:   Pain cannot be controlled with the prescribed medicine.  You have a fever.  Pain continues for longer than your caregiver advises it should.  There is a change in the pain, and you develop chest discomfort or constant abdominal pain.  You feel faint or pass out. MAKE SURE YOU:   Understand these instructions.  Will watch your condition.  Will get help right away if you are not doing well or get worse. Document Released: 09/29/2004 Document Revised: 03/14/2011 Document Reviewed: 06/16/2010 Exit[employer] Patient Information 2013 [employer].  

## 2012-06-01 ENCOUNTER — Other Ambulatory Visit (INDEPENDENT_AMBULATORY_CARE_PROVIDER_SITE_OTHER): Payer: 59

## 2012-06-01 DIAGNOSIS — E559 Vitamin D deficiency, unspecified: Secondary | ICD-10-CM

## 2012-06-01 DIAGNOSIS — E785 Hyperlipidemia, unspecified: Secondary | ICD-10-CM

## 2012-06-01 DIAGNOSIS — R5383 Other fatigue: Secondary | ICD-10-CM

## 2012-06-01 LAB — COMPREHENSIVE METABOLIC PANEL
ALT: 26 U/L (ref 0–35)
AST: 22 U/L (ref 0–37)
Albumin: 4.2 g/dL (ref 3.5–5.2)
Alkaline Phosphatase: 60 U/L (ref 39–117)
BUN: 13 mg/dL (ref 6–23)
CO2: 30 mEq/L (ref 19–32)
Calcium: 9.5 mg/dL (ref 8.4–10.5)
Chloride: 104 mEq/L (ref 96–112)
Creat: 0.78 mg/dL (ref 0.50–1.10)
Glucose, Bld: 87 mg/dL (ref 70–99)
Potassium: 4 mEq/L (ref 3.5–5.3)
Sodium: 142 mEq/L (ref 135–145)
Total Bilirubin: 0.6 mg/dL (ref 0.3–1.2)
Total Protein: 6.1 g/dL (ref 6.0–8.3)

## 2012-06-02 LAB — VITAMIN D 25 HYDROXY (VIT D DEFICIENCY, FRACTURES): Vit D, 25-Hydroxy: 51 ng/mL (ref 30–89)

## 2012-06-04 LAB — NMR LIPOPROFILE WITH LIPIDS
Cholesterol, Total: 144 mg/dL (ref <200)
HDL Particle Number: 38.2 umol/L (ref >=30.5)
HDL Size: 8.6 nm — ABNORMAL LOW (ref >=9.2)
HDL-C: 49 mg/dL (ref >=40)
LDL (calc): 68 mg/dL (ref <100)
LDL Particle Number: 1149 nmol/L — ABNORMAL HIGH (ref <1000)
LDL Size: 19.9 nm — ABNORMAL LOW (ref >20.5)
LP-IR Score: 51 — ABNORMAL HIGH (ref <=45)
Large HDL-P: 4.8 umol/L (ref >=4.8)
Large VLDL-P: 1.2 nmol/L (ref <=2.7)
Small LDL Particle Number: 855 nmol/L — ABNORMAL HIGH (ref <=527)
Triglycerides: 137 mg/dL (ref <150)
VLDL Size: 43.7 nm (ref <=46.6)

## 2012-06-05 NOTE — Progress Notes (Signed)
Quick Note:  Lab result at goal. No change in Medications for now. No Change in plans and follow up. ______ 

## 2012-06-08 ENCOUNTER — Ambulatory Visit: Payer: Self-pay | Admitting: Family Medicine

## 2012-06-12 ENCOUNTER — Ambulatory Visit (INDEPENDENT_AMBULATORY_CARE_PROVIDER_SITE_OTHER): Payer: 59 | Admitting: Family Medicine

## 2012-06-12 ENCOUNTER — Encounter: Payer: Self-pay | Admitting: Family Medicine

## 2012-06-12 VITALS — BP 142/83 | HR 96 | Temp 98.4°F | Wt 112.2 lb

## 2012-06-12 DIAGNOSIS — IMO0001 Reserved for inherently not codable concepts without codable children: Secondary | ICD-10-CM | POA: Insufficient documentation

## 2012-06-12 DIAGNOSIS — E785 Hyperlipidemia, unspecified: Secondary | ICD-10-CM

## 2012-06-12 DIAGNOSIS — E559 Vitamin D deficiency, unspecified: Secondary | ICD-10-CM | POA: Insufficient documentation

## 2012-06-12 DIAGNOSIS — R030 Elevated blood-pressure reading, without diagnosis of hypertension: Secondary | ICD-10-CM

## 2012-06-12 DIAGNOSIS — M5432 Sciatica, left side: Secondary | ICD-10-CM

## 2012-06-12 DIAGNOSIS — M5430 Sciatica, unspecified side: Secondary | ICD-10-CM | POA: Insufficient documentation

## 2012-06-12 DIAGNOSIS — K219 Gastro-esophageal reflux disease without esophagitis: Secondary | ICD-10-CM | POA: Insufficient documentation

## 2012-06-12 NOTE — Progress Notes (Signed)
Patient ID: [name], female   DOB: November 13, 1950, 62 y.o.   MRN: [id number] SUBJECTIVE: CC: Chief Complaint  Patient presents with  . Follow-up    6 month followq up some sciatic pain     HPI: Patient is here for follow up of hyperlipidemia:  denies Headache;denies Chest Pain;denies weakness;denies Shortness of Breath and orthopnea;denies Visual changes;denies palpitations;denies cough;denies pedal edema;denies symptoms of TIA or stroke;deniesClaudication symptoms. admits to Compliance with medications; denies Problems with medications.  Rides her bike on trails or her road bike. 600 miles this year so far. Left sciatic nerve hurts  Sometimes.   PMH/PSH: reviewed/updated in Epic  SH/FH: reviewed/updated in Epic  Allergies: reviewed/updated in Epic  Medications: reviewed/updated in Epic  Immunizations: reviewed/updated in Epic  ROS: As above in the HPI. All other systems are stable or negative.  OBJECTIVE: APPEARANCE:  Patient in no acute distress.The patient appeared well nourished and normally developed. Acyanotic. Waist: VITAL SIGNS:BP 142/83  Pulse 96  Temp(Src) 98.4 F (36.9 C) (Oral)  Wt 112 lb 3.2 oz (50.894 kg)  BMI 21.91 kg/m2 WF looks well.  SKIN: warm and  Dry without overt rashes, tattoos and scars  HEAD and Neck: without JVD, Head and scalp: normal Eyes:No scleral icterus. Fundi normal, eye movements normal. Ears: Auricle normal, canal normal, Tympanic membranes normal, insufflation normal. Nose: normal Throat: normal. Right lower premolar extracted. Gum looks good. Neck & thyroid: normal  CHEST & LUNGS: Chest wall: normal Lungs: Clear  CVS: Reveals the PMI to be normally located. Regular rhythm, First and Second Heart sounds are normal,  absence of murmurs, rubs or gallops. Peripheral vasculature: Radial pulses: normal Dorsal pedis pulses: normal Posterior pulses: normal  ABDOMEN:  Appearance: normal Benign, no organomegaly, no  masses, no Abdominal Aortic enlargement. No Guarding , no rebound. No Bruits. Bowel sounds: normal  RECTAL: N/A GU: N/A  EXTREMETIES: nonedematous. Both Femoral and Pedal pulses are normal.  MUSCULOSKELETAL:  Spine: normal Joints: intact  NEUROLOGIC: oriented to time,place and person; nonfocal. Strength is normal Sensory is normal Reflexes are normal Cranial Nerves are normal.  ASSESSMENT: HYPERLIPIDEMIA - Plan: COMPLETE METABOLIC PANEL WITH GFR, NMR Lipoprofile with Lipids  Sciatica, left  GERD (gastroesophageal reflux disease)  Unspecified vitamin D deficiency - Plan: COMPLETE METABOLIC PANEL WITH GFR, Vitamin D 25 hydroxy  Elevated BP  PLAN: Orders Placed This Encounter  Procedures  . COMPLETE METABOLIC PANEL WITH GFR    Standing Status: Future     Number of Occurrences:      Standing Expiration Date: 06/12/2013  . NMR Lipoprofile with Lipids    Standing Status: Future     Number of Occurrences:      Standing Expiration Date: 06/12/2013  . Vitamin D 25 hydroxy    Standing Status: Future     Number of Occurrences:      Standing Expiration Date: 06/12/2013   No orders of the defined types were placed in this encounter.   Results for orders placed in visit on 06/01/12  COMPREHENSIVE METABOLIC PANEL      Result Value Range   Sodium 142  135 - 145 mEq/L   Potassium 4.0  3.5 - 5.3 mEq/L   Chloride 104  96 - 112 mEq/L   CO2 30  19 - 32 mEq/L   Glucose, Bld 87  70 - 99 mg/dL   BUN 13  6 - 23 mg/dL   Creat 4.09  8.11 - 9.14 mg/dL   Total Bilirubin 0.6  0.3 -  1.2 mg/dL   Alkaline Phosphatase 60  39 - 117 U/L   AST 22  0 - 37 U/L   ALT 26  0 - 35 U/L   Total Protein 6.1  6.0 - 8.3 g/dL   Albumin 4.2  3.5 - 5.2 g/dL   Calcium 9.5  8.4 - 16.1 mg/dL  NMR LIPOPROFILE WITH LIPIDS      Result Value Range   LDL Particle Number 1149 (*) <1000 nmol/L   LDL (calc) 68  <100 mg/dL   HDL-C 49  >=09 mg/dL   Triglycerides 604  <540 mg/dL   Cholesterol, Total 981  <200  mg/dL   HDL Particle Number 19.1  >=47.8 umol/L   Large HDL-P 4.8  >=4.8 umol/L   Large VLDL-P 1.2  <=2.7 nmol/L   Small LDL Particle Number 855 (*) <=527 nmol/L   LDL Size 19.9 (*) >20.5 nm   HDL Size 8.6 (*) >=9.2 nm   VLDL Size 43.7  <=46.6 nm   LP-IR Score 51 (*) <=45  VITAMIN D 25 HYDROXY      Result Value Range   Vit D, 25-Hydroxy 51  30 - 89 ng/mL   Reviewed labs with patient.  Extra padding for bike saddles. Heat to buttocks.  Monitor BP.BP may be elevated secondary to pain from the extracted tooth.  RTC early if BP elevated 140/90.  Return in about 6 months (around 12/12/2012) for Recheck medical problems, and labs before.   Rehman Levinson P. [place] Charon, M.D.

## 2012-06-22 ENCOUNTER — Other Ambulatory Visit: Payer: Self-pay | Admitting: Gynecology

## 2012-06-22 DIAGNOSIS — Z1231 Encounter for screening mammogram for malignant neoplasm of breast: Secondary | ICD-10-CM

## 2012-07-25 ENCOUNTER — Encounter: Payer: Self-pay | Admitting: Gynecology

## 2012-07-25 ENCOUNTER — Ambulatory Visit (INDEPENDENT_AMBULATORY_CARE_PROVIDER_SITE_OTHER): Payer: 59 | Admitting: Gynecology

## 2012-07-25 ENCOUNTER — Ambulatory Visit (HOSPITAL_COMMUNITY)
Admission: RE | Admit: 2012-07-25 | Discharge: 2012-07-25 | Disposition: A | Discharge status: Home / Self Care | Payer: 59 | Source: Ambulatory Visit | Attending: Gynecology | Admitting: Gynecology

## 2012-07-25 VITALS — BP 120/76 | Ht 59.0 in | Wt 112.0 lb

## 2012-07-25 DIAGNOSIS — M8580 Other specified disorders of bone density and structure, unspecified site: Secondary | ICD-10-CM

## 2012-07-25 DIAGNOSIS — Z1231 Encounter for screening mammogram for malignant neoplasm of breast: Secondary | ICD-10-CM | POA: Insufficient documentation

## 2012-07-25 DIAGNOSIS — N952 Postmenopausal atrophic vaginitis: Secondary | ICD-10-CM

## 2012-07-25 DIAGNOSIS — M899 Disorder of bone, unspecified: Secondary | ICD-10-CM

## 2012-07-25 DIAGNOSIS — Z01419 Encounter for gynecological examination (general) (routine) without abnormal findings: Secondary | ICD-10-CM

## 2012-07-25 NOTE — Progress Notes (Signed)
[name] 1950-06-15 [id number]        62 y.o.  G1P1001 for annual exam.  Former patient of Dr. Eda Paschal. Doing well without complaints.  Past medical history,surgical history, medications, allergies, family history and social history were all reviewed and documented in the EPIC chart.  ROS:  Performed and pertinent positives and negatives are included in the history, assessment and plan .  Exam: Kim assistant Filed Vitals:   07/25/12 1415  BP: 120/76  Height: 4\' 11"  (1.499 m)  Weight: 112 lb (50.803 kg)   General appearance  Normal Skin grossly normal Head/Neck normal with no cervical or supraclavicular adenopathy thyroid normal Lungs  clear Cardiac RR, without RMG Abdominal  soft, nontender, without masses, organomegaly or hernia Breasts  examined lying and sitting without masses, retractions, discharge or axillary adenopathy. Pelvic  Ext/BUS/vagina  normal with atrophic changes  Cervix  normal with atrophic changes  Uterus  anteverted, normal size, shape and contour, midline and mobile nontender   Adnexa  Without masses or tenderness    Anus and perineum  normal   Rectovaginal  normal sphincter tone without palpated masses or tenderness.    Assessment/Plan:  62 y.o. G53P1001 female for annual exam.   1. Postmenopausal. Not having significant hot flushes, night sweats, vaginal dryness or dyspareunia. No bleeding at all. Continue to monitor. Followup of any bleeding or significant symptoms develop. 2. Osteopenia. DEXA 08/2010 T score -2.0 FRAX 9%/1.3% 3. Pap smear 2013. No Pap smear done today. History of cryosurgery over 30 years ago with normal Pap smears since then. Plan repeat Pap smear in 3 year interval. 4. Mammogram today. Continue with annual mammography. SBE monthly reviewed. 5. Colonoscopy 2011. Followup at their recommended interval. 6. Health maintenance. No blood work done today as it is all done through her primary physician's office. Follow up for bone density  otherwise one year, sooner as needed.  Note: This document was prepared with digital dictation and possible smart phrase technology. Any transcriptional errors that result from this process are unintentional.   Dara Lords MD, 2:42 PM 07/25/2012

## 2012-07-25 NOTE — Patient Instructions (Signed)
Follow up for bone density as schedulee

## 2012-07-26 LAB — URINALYSIS W MICROSCOPIC + REFLEX CULTURE
Bacteria, UA: NONE SEEN
Ketones, ur: NEGATIVE mg/dL
Nitrite: NEGATIVE
Specific Gravity, Urine: 1.025 (ref 1.005–1.030)
Urobilinogen, UA: 0.2 mg/dL (ref 0.0–1.0)

## 2012-09-03 DIAGNOSIS — M8580 Other specified disorders of bone density and structure, unspecified site: Secondary | ICD-10-CM

## 2012-09-03 HISTORY — DX: Other specified disorders of bone density and structure, unspecified site: M85.80

## 2012-09-14 ENCOUNTER — Ambulatory Visit (INDEPENDENT_AMBULATORY_CARE_PROVIDER_SITE_OTHER): Payer: 59

## 2012-09-14 ENCOUNTER — Encounter: Payer: Self-pay | Admitting: Family Medicine

## 2012-09-14 ENCOUNTER — Ambulatory Visit (INDEPENDENT_AMBULATORY_CARE_PROVIDER_SITE_OTHER): Payer: 59 | Admitting: Family Medicine

## 2012-09-14 ENCOUNTER — Telehealth: Payer: Self-pay | Admitting: Family Medicine

## 2012-09-14 VITALS — BP 149/84 | HR 85 | Temp 97.4°F | Wt 110.8 lb

## 2012-09-14 DIAGNOSIS — IMO0002 Reserved for concepts with insufficient information to code with codable children: Secondary | ICD-10-CM

## 2012-09-14 DIAGNOSIS — M549 Dorsalgia, unspecified: Secondary | ICD-10-CM

## 2012-09-14 DIAGNOSIS — M5416 Radiculopathy, lumbar region: Secondary | ICD-10-CM

## 2012-09-14 LAB — POCT CBC
Granulocyte percent: 52.5 %G (ref 37–80)
HCT, POC: 42.2 % (ref 37.7–47.9)
Hemoglobin: 14.3 g/dL (ref 12.2–16.2)
Lymph, poc: 3.5 — AB (ref 0.6–3.4)
MCH, POC: 29.3 pg (ref 27–31.2)
MCHC: 34 g/dL (ref 31.8–35.4)
MCV: 86.3 fL (ref 80–97)
MPV: 8.1 fL (ref 0–99.8)
POC Granulocyte: 4.7 (ref 2–6.9)
POC LYMPH PERCENT: 39.2 %L (ref 10–50)
Platelet Count, POC: 225 10*3/uL (ref 142–424)
RBC: 4.9 M/uL (ref 4.04–5.48)
RDW, POC: 13.8 %
WBC: 9 10*3/uL (ref 4.6–10.2)

## 2012-09-14 LAB — POCT UA - MICROSCOPIC ONLY
Crystals, Ur, HPF, POC: NEGATIVE
Yeast, UA: NEGATIVE

## 2012-09-14 LAB — POCT URINALYSIS DIPSTICK
Bilirubin, UA: NEGATIVE
Glucose, UA: NEGATIVE
Ketones, UA: NEGATIVE
Leukocytes, UA: NEGATIVE
Nitrite, UA: NEGATIVE
Spec Grav, UA: 1.02
Urobilinogen, UA: NEGATIVE
pH, UA: 5

## 2012-09-14 MED ORDER — PREDNISONE 20 MG PO TABS: 40.0000 mg | ORAL_TABLET | Freq: Every day | ORAL | Status: DC

## 2012-09-14 MED ORDER — TRAMADOL HCL 50 MG PO TABS: 50.0000 mg | ORAL_TABLET | Freq: Three times a day (TID) | ORAL | Status: DC | PRN

## 2012-09-14 NOTE — Progress Notes (Signed)
Patient ID: [name], female   DOB: 1950-02-24, 62 y.o.   MRN: [id number] SUBJECTIVE: CC: Chief Complaint  Patient presents with  . Follow-up    back pain rt side pain  rt upper thiigh tingles .     HPI: Back pain for 3 days. Lower lumbar. Twisting and pain radiates to the right inguinal area and anterior thigh. Thigh tingles. No rash. No weakness , no injuries. Right inguinal area is  Sore and she was worried about appendicitis No fever, no diarrhea no vomiting. No urinary symptoms , but she has had a h/o kidney stones in the past.  Past Medical History  Diagnosis Date  . PUD (peptic ulcer disease)   . Allergic rhinitis   . GERD (gastroesophageal reflux disease)   . Kidney stones   . Chronic constipation   . Diverticulosis   . Vitamin D deficiency   . Hyperlipidemia   . Cerumen impaction     right   . Nasal congestion   . Sinusitis   . Palpitation   . Chest discomfort   . Osteopenia 08/2010    t score -2.0 Frax 9.3%/1.3%  . Cervical dysplasia   . Scoliosis    Past Surgical History  Procedure Laterality Date  . Laparoscopic cholecystectomy    . Tubal ligation  1991  . Cardiac catheterization    . Excision of mole  2007  . Gynecologic cryosurgery    . Colposcopy     History   Social History  . Marital Status: Married    Spouse Name: N/A    Number of Children: N/A  . Years of Education: N/A   Occupational History  . Not on file.   Social History Main Topics  . Smoking status: Never Smoker   . Smokeless tobacco: Not on file  . Alcohol Use: Yes     Comment: rare  . Drug Use: No  . Sexual Activity: Yes    Birth Control/ Protection: Post-menopausal, Surgical     Comment: Tubal lig   Other Topics Concern  . Not on file   Social History Narrative  . No narrative on file   Family History  Problem Relation Age of Onset  . Hypertension Mother   . Heart disease Mother   . Hypertension Father   . Cancer Father     LUNG  . Diabetes Sister   .  Hypertension Sister   . Hyperlipidemia Sister    Current Outpatient Prescriptions on File Prior to Visit  Medication Sig Dispense Refill  . ALPRAZolam (XANAX) 0.25 MG tablet Take 0.25 mg by mouth at bedtime as needed.        Marland Kitchen atorvastatin (LIPITOR) 20 MG tablet Take 20 mg by mouth daily.        Marland Kitchen BIOTIN PO Take by mouth.      Marland Kitchen CALCIUM-MAGNESIUM PO Take by mouth.        . Cyanocobalamin (VITAMIN B-12 PO) Take by mouth.        . lansoprazole (PREVACID) 30 MG capsule Take 30 mg by mouth daily.        Marland Kitchen loratadine (CLARITIN) 10 MG tablet Take 10 mg by mouth daily.        . Pseudoephedrine-Guaifenesin (MUCINEX D PO) Take by mouth.        . Pyridoxine HCl (VITAMIN B6 PO) Take by mouth.         No current facility-administered medications on file prior to visit.   Allergies  Allergen Reactions  .  Sulfonamide Derivatives     REACTION: severe GI upset    There is no immunization history on file for this patient. Prior to Admission medications   Medication Sig Start Date End Date Taking? Authorizing Provider  ALPRAZolam (XANAX) 0.25 MG tablet Take 0.25 mg by mouth at bedtime as needed.     Yes Historical Provider, MD  atorvastatin (LIPITOR) 20 MG tablet Take 20 mg by mouth daily.     Yes Historical Provider, MD  BIOTIN PO Take by mouth.   Yes Historical Provider, MD  CALCIUM-MAGNESIUM PO Take by mouth.     Yes Historical Provider, MD  Cyanocobalamin (VITAMIN B-12 PO) Take by mouth.     Yes Historical Provider, MD  lansoprazole (PREVACID) 30 MG capsule Take 30 mg by mouth daily.     Yes Historical Provider, MD  loratadine (CLARITIN) 10 MG tablet Take 10 mg by mouth daily.     Yes Historical Provider, MD  Pseudoephedrine-Guaifenesin [facility] D PO) Take by mouth.     Yes Historical Provider, MD  Pyridoxine HCl (VITAMIN B6 PO) Take by mouth.     Yes Historical Provider, MD    ROS: As above in the HPI. All other systems are stable or negative.  OBJECTIVE: APPEARANCE:  Patient in no  acute distress.The patient appeared well nourished and normally developed. Acyanotic. Waist: VITAL SIGNS:BP 149/84  Pulse 85  Temp(Src) 97.4 F (36.3 C) (Oral)  Wt 110 lb 12.8 oz (50.259 kg)  BMI 22.37 kg/m2   SKIN: warm and  Dry without overt rashes, tattoos and scars  HEAD and Neck: without JVD, Head and scalp: normal Eyes:No scleral icterus. Fundi normal, eye movements normal. Ears: Auricle normal, canal normal, Tympanic membranes normal, insufflation normal. Nose: normal Throat: normal Neck & thyroid: normal  CHEST & LUNGS: Chest wall: normal Lungs: Clear  CVS: Reveals the PMI to be normally located. Regular rhythm, First and Second Heart sounds are normal,  absence of murmurs, rubs or gallops. Peripheral vasculature: Radial pulses: normal Dorsal pedis pulses: normal Posterior pulses: normal  ABDOMEN:  Appearance: normal Benign, no organomegaly, no masses, no Abdominal Aortic enlargement. No Guarding , no rebound. No Bruits. Bowel sounds: normal  RECTAL: N/A GU: N/A  EXTREMETIES: nonedematous.  MUSCULOSKELETAL:  Spine: flexion normal, however twisting reproduces pain. No rashes. SLR mildly positive on the left. Anterior thigh and right inguinal dysesthesia. Joints: intact  NEUROLOGIC: oriented to time,place and person; nonfocal. Strength is normal Sensory is normal Reflexes are normal Cranial Nerves are normal.  ASSESSMENT: Back pain - Plan: POCT UA - Microscopic Only, POCT urinalysis dipstick, POCT CBC, DG Lumbar Spine Complete, predniSONE (DELTASONE) 20 MG tablet, traMADol (ULTRAM) 50 MG tablet, CANCELED: POCT urinalysis dipstick, CANCELED: POCT UA - Microscopic Only  Lumbar radicular pain - Plan: predniSONE (DELTASONE) 20 MG tablet, traMADol (ULTRAM) 50 MG tablet    PLAN: Orders Placed This Encounter  Procedures  . DG Lumbar Spine Complete    Standing Status: Future     Number of Occurrences: 1     Standing Expiration Date: 11/14/2013     Order Specific Question:  Reason for Exam (SYMPTOM  OR DIAGNOSIS REQUIRED)    Answer:  lumbar radiculopathy L4    Order Specific Question:  Preferred imaging location?    Answer:  Internal  . POCT UA - Microscopic Only  . POCT urinalysis dipstick  . POCT CBC    [facility] reading (PRIMARY) by  Dr. Modesto Charon: scoliosis with possibly right L4 and L5 foraminal involvement. Degenerative  changes.  Meds ordered this encounter  Medications  . predniSONE (DELTASONE) 20 MG tablet    Sig: Take 2 tablets (40 mg total) by mouth daily. Daily for 2 days the 1 tab daily for 2 days the 1/2 tab daily for 2 days.    Dispense:  7 tablet    Refill:  0  . traMADol (ULTRAM) 50 MG tablet    Sig: Take 1 tablet (50 mg total) by mouth every 8 (eight) hours as needed for pain.    Dispense:  20 tablet    Refill:  0   Return in about 1 week (around 09/21/2012) for Recheck medical problems.  Lamonica Trueba P. Modesto Charon, M.D.

## 2012-09-17 NOTE — Telephone Encounter (Signed)
Patient was seen on 09/14/12

## 2012-09-19 ENCOUNTER — Telehealth: Payer: Self-pay | Admitting: Family Medicine

## 2012-09-20 ENCOUNTER — Encounter: Payer: Self-pay | Admitting: Family Medicine

## 2012-09-20 ENCOUNTER — Telehealth: Payer: Self-pay | Admitting: Family Medicine

## 2012-09-20 ENCOUNTER — Ambulatory Visit (INDEPENDENT_AMBULATORY_CARE_PROVIDER_SITE_OTHER): Payer: 59 | Admitting: Family Medicine

## 2012-09-20 VITALS — BP 132/85 | HR 80 | Temp 97.4°F | Wt 110.8 lb

## 2012-09-20 DIAGNOSIS — B029 Zoster without complications: Secondary | ICD-10-CM | POA: Insufficient documentation

## 2012-09-20 DIAGNOSIS — R21 Rash and other nonspecific skin eruption: Secondary | ICD-10-CM

## 2012-09-20 DIAGNOSIS — M5416 Radiculopathy, lumbar region: Secondary | ICD-10-CM

## 2012-09-20 DIAGNOSIS — R319 Hematuria, unspecified: Secondary | ICD-10-CM

## 2012-09-20 DIAGNOSIS — IMO0002 Reserved for concepts with insufficient information to code with codable children: Secondary | ICD-10-CM

## 2012-09-20 LAB — POCT URINALYSIS DIPSTICK
Bilirubin, UA: NEGATIVE
Glucose, UA: NEGATIVE
Ketones, UA: NEGATIVE
Leukocytes, UA: NEGATIVE
Nitrite, UA: NEGATIVE
Spec Grav, UA: 1.02
Urobilinogen, UA: NEGATIVE
pH, UA: 6.5

## 2012-09-20 LAB — POCT UA - MICROSCOPIC ONLY
Casts, Ur, LPF, POC: NEGATIVE
Crystals, Ur, HPF, POC: NEGATIVE
Yeast, UA: NEGATIVE

## 2012-09-20 MED ORDER — ACYCLOVIR 800 MG PO TABS: 800.0000 mg | ORAL_TABLET | Freq: Every day | ORAL | Status: DC

## 2012-09-20 NOTE — Telephone Encounter (Signed)
APPT MADE

## 2012-09-20 NOTE — Progress Notes (Signed)
Patient ID: [name], female   DOB: [birth year], 62 y.o.   MRN: [id number] SUBJECTIVE: CC: Chief Complaint  Patient presents with  . Follow-up    reck rash and urine     HPI: Rash started on the right anterior thigh. she was being treated for the lumbar radiculopathy, but was warned that a rash may signal zoster. She came because now a little rash appeared.. The anterior thigh tingles and feels numb  Follow up on the hematuria.gave a repeat sample.  Past Medical History  Diagnosis Date  . PUD (peptic ulcer disease)   . Allergic rhinitis   . GERD (gastroesophageal reflux disease)   . Kidney stones   . Chronic constipation   . Diverticulosis   . Vitamin D deficiency   . Hyperlipidemia   . Cerumen impaction     right   . Nasal congestion   . Sinusitis   . Palpitation   . Chest discomfort   . Osteopenia 08/2010    t score -2.0 Frax 9.3%/1.3%  . Cervical dysplasia   . Scoliosis    Past Surgical History  Procedure Laterality Date  . Laparoscopic cholecystectomy    . Tubal ligation  1991  . Cardiac catheterization    . Excision of mole  2007  . Gynecologic cryosurgery    . Colposcopy     History   Social History  . Marital Status: Married    Spouse Name: N/A    Number of Children: N/A  . Years of Education: N/A   Occupational History  . Not on file.   Social History Main Topics  . Smoking status: Never Smoker   . Smokeless tobacco: Not on file  . Alcohol Use: Yes     Comment: rare  . Drug Use: No  . Sexual Activity: Yes    Birth Control/ Protection: Post-menopausal, Surgical     Comment: Tubal lig   Other Topics Concern  . Not on file   Social History Narrative  . No narrative on file   Family History  Problem Relation Age of Onset  . Hypertension Mother   . Heart disease Mother   . Hypertension Father   . Cancer Father     LUNG  . Diabetes Sister   . Hypertension Sister   . Hyperlipidemia Sister    Current Outpatient Prescriptions on File  Prior to Visit  Medication Sig Dispense Refill  . ALPRAZolam (XANAX) 0.25 MG tablet Take 0.25 mg by mouth at bedtime as needed.        Marland Kitchen atorvastatin (LIPITOR) 20 MG tablet Take 20 mg by mouth daily.        Marland Kitchen BIOTIN PO Take by mouth.      Marland Kitchen CALCIUM-MAGNESIUM PO Take by mouth.        . Cyanocobalamin (VITAMIN B-12 PO) Take by mouth.        . lansoprazole (PREVACID) 30 MG capsule Take 30 mg by mouth daily.        Marland Kitchen loratadine (CLARITIN) 10 MG tablet Take 10 mg by mouth daily.        . predniSONE (DELTASONE) 20 MG tablet Take 2 tablets (40 mg total) by mouth daily. Daily for 2 days the 1 tab daily for 2 days the 1/2 tab daily for 2 days.  7 tablet  0  . Pseudoephedrine-Guaifenesin (MUCINEX D PO) Take by mouth.        . Pyridoxine HCl (VITAMIN B6 PO) Take by mouth.        Marland Kitchen  traMADol (ULTRAM) 50 MG tablet Take 1 tablet (50 mg total) by mouth every 8 (eight) hours as needed for pain.  20 tablet  0   No current facility-administered medications on file prior to visit.   Allergies  Allergen Reactions  . Sulfonamide Derivatives     REACTION: severe GI upset    There is no immunization history on file for this patient. Prior to Admission medications   Medication Sig Start Date End Date Taking? Authorizing Provider  ALPRAZolam (XANAX) 0.25 MG tablet Take 0.25 mg by mouth at bedtime as needed.     Yes Historical Provider, MD  atorvastatin (LIPITOR) 20 MG tablet Take 20 mg by mouth daily.     Yes Historical Provider, MD  BIOTIN PO Take by mouth.   Yes Historical Provider, MD  CALCIUM-MAGNESIUM PO Take by mouth.     Yes Historical Provider, MD  Cyanocobalamin (VITAMIN B-12 PO) Take by mouth.     Yes Historical Provider, MD  lansoprazole (PREVACID) 30 MG capsule Take 30 mg by mouth daily.     Yes Historical Provider, MD  loratadine (CLARITIN) 10 MG tablet Take 10 mg by mouth daily.     Yes Historical Provider, MD  predniSONE (DELTASONE) 20 MG tablet Take 2 tablets (40 mg total) by mouth daily. Daily  for 2 days the 1 tab daily for 2 days the 1/2 tab daily for 2 days. 09/14/12  Yes Ileana Ladd, MD  Pseudoephedrine-Guaifenesin [facility] D PO) Take by mouth.     Yes Historical Provider, MD  Pyridoxine HCl (VITAMIN B6 PO) Take by mouth.     Yes Historical Provider, MD  traMADol (ULTRAM) 50 MG tablet Take 1 tablet (50 mg total) by mouth every 8 (eight) hours as needed for pain. 09/14/12  Yes Ileana Ladd, MD     ROS: As above in the HPI. All other systems are stable or negative.  OBJECTIVE: APPEARANCE:  Patient in no acute distress.The patient appeared well nourished and normally developed. Acyanotic. Waist: VITAL SIGNS:BP 132/85  Pulse 80  Temp(Src) 97.4 F (36.3 C) (Oral)  Wt 110 lb 12.8 oz (50.259 kg)  BMI 22.37 kg/m2 WF petite  SKIN: warm and  Dry without, tattoos and scars. Papular rash on the anterior thigh small 1cm  long.  HEAD and Neck: without JVD, Head and scalp: normal Eyes:No scleral icterus. Fundi normal, eye movements normal. Ears: Auricle normal, canal normal, Tympanic membranes normal, insufflation normal. Nose: normal Throat: normal Neck & thyroid: normal  CHEST & LUNGS: Chest wall: normal Lungs: Clear  CVS: Reveals the PMI to be normally located. Regular rhythm, First and Second Heart sounds are normal,  absence of murmurs, rubs or gallops. Peripheral vasculature: Radial pulses: normal Dorsal pedis pulses: normal Posterior pulses: normal  ABDOMEN:  Appearance: normal Benign, no organomegaly, no masses, no Abdominal Aortic enlargement. No Guarding , no rebound. No Bruits. Bowel sounds: normal  RECTAL: N/A GU: N/A  EXTREMETIES: nonedematous.  MUSCULOSKELETAL:  Spine: normal Joints: intact  NEUROLOGIC: oriented to time,place and person; nonfocal. Strength is normal Sensory praesthesia on the anterior thigh. Reflexes are normal Cranial Nerves are normal. Results for orders placed in visit on 09/14/12  POCT UA - MICROSCOPIC ONLY       Result Value Range   WBC, Ur, HPF, POC 3-5     RBC, urine, microscopic 2-15     Bacteria, U Microscopic occ     Mucus, UA mod     Epithelial cells, urine per micros  occ     Crystals, Ur, HPF, POC neg     Casts, Ur, LPF, POC occ     Yeast, UA neg    POCT URINALYSIS DIPSTICK      Result Value Range   Color, UA yellow     Clarity, UA clear     Glucose, UA neg     Bilirubin, UA neg     Ketones, UA neg     Spec Grav, UA 1.020     Blood, UA mod     pH, UA 5.0     Protein, UA large     Urobilinogen, UA negative     Nitrite, UA neg     Leukocytes, UA Negative    POCT CBC      Result Value Range   WBC 9.0  4.6 - 10.2 K/uL   Lymph, poc 3.5 (*) 0.6 - 3.4   POC LYMPH PERCENT 39.2  10 - 50 %L   POC Granulocyte 4.7  2 - 6.9   Granulocyte percent 52.5  37 - 80 %G   RBC 4.9  4.04 - 5.48 M/uL   Hemoglobin 14.3  12.2 - 16.2 g/dL   HCT, POC 14.7  82.9 - 47.9 %   MCV 86.3  80 - 97 fL   MCH, POC 29.3  27 - 31.2 pg   MCHC 34.0  31.8 - 35.4 g/dL   RDW, POC 56.2     Platelet Count, POC 225.0  142 - 424 K/uL   MPV 8.1  0 - 99.8 fL    ASSESSMENT: Blood in urine - Plan: POCT urinalysis dipstick, POCT UA - Microscopic Only, CANCELED: POCT urinalysis dipstick  Hematuria - Plan: POCT UA - Microscopic Only, Urine culture, CANCELED: POCT urinalysis dipstick  Lumbar radicular pain  Rash and nonspecific skin eruption  Shingles outbreak - Plan: acyclovir (ZOVIRAX) 800 MG tablet   PLAN:  Orders Placed This Encounter  Procedures  . Urine culture  . POCT urinalysis dipstick  . POCT UA - Microscopic Only     Results for orders placed in visit on 09/20/12  POCT URINALYSIS DIPSTICK      Result Value Range   Color, UA gold     Clarity, UA slightly cloudy     Glucose, UA negative     Bilirubin, UA negative     Ketones, UA negative     Spec Grav, UA 1.020     Blood, UA moderate     pH, UA 6.5     Protein, UA moderate     Urobilinogen, UA negative     Nitrite, UA negative      Leukocytes, UA Negative    POCT UA - MICROSCOPIC ONLY      Result Value Range   WBC, Ur, HPF, POC 3-4     RBC, urine, microscopic 5-20     Bacteria, U Microscopic few     Mucus, UA large     Epithelial cells, urine per micros few     Crystals, Ur, HPF, POC negative     Casts, Ur, LPF, POC negative     Yeast, UA negative     Meds ordered this encounter  Medications  . acyclovir (ZOVIRAX) 800 MG tablet    Sig: Take 1 tablet (800 mg total) by mouth 5 (five) times daily. For 10 days    Dispense:  50 tablet    Refill:  1   Discussed zoster.skin care.  Return in about 1 week (around  09/27/2012) for Recheck medical problems.  Samnang Shugars P. Modesto Charon, M.D.

## 2012-09-21 LAB — URINE CULTURE: Organism ID, Bacteria: NO GROWTH

## 2012-09-22 NOTE — Progress Notes (Signed)
Quick Note:  Labs abnormal. No infection. Will discuss at follow up. Will need to see Urologist to check cause of the blood in the urine. ______

## 2012-09-27 ENCOUNTER — Ambulatory Visit (INDEPENDENT_AMBULATORY_CARE_PROVIDER_SITE_OTHER): Payer: 59

## 2012-09-27 ENCOUNTER — Ambulatory Visit (INDEPENDENT_AMBULATORY_CARE_PROVIDER_SITE_OTHER): Payer: 59 | Admitting: Family Medicine

## 2012-09-27 ENCOUNTER — Encounter: Payer: Self-pay | Admitting: Family Medicine

## 2012-09-27 VITALS — BP 128/80 | HR 96 | Temp 98.6°F | Wt 112.0 lb

## 2012-09-27 DIAGNOSIS — M8580 Other specified disorders of bone density and structure, unspecified site: Secondary | ICD-10-CM

## 2012-09-27 DIAGNOSIS — M899 Disorder of bone, unspecified: Secondary | ICD-10-CM

## 2012-09-27 DIAGNOSIS — IMO0002 Reserved for concepts with insufficient information to code with codable children: Secondary | ICD-10-CM

## 2012-09-27 DIAGNOSIS — M5416 Radiculopathy, lumbar region: Secondary | ICD-10-CM

## 2012-09-27 DIAGNOSIS — R21 Rash and other nonspecific skin eruption: Secondary | ICD-10-CM

## 2012-09-27 NOTE — Progress Notes (Signed)
Patient ID: [name], female   DOB: 10/29/1950, 62 y.o.   MRN: [id number] SUBJECTIVE: CC: Chief Complaint  Patient presents with  . Follow-up    reck rash     HPI: Rash is still there but the lumbar radiculitis is better. Was able to ride her bicycle theis past weekend.  Past Medical History  Diagnosis Date  . PUD (peptic ulcer disease)   . Allergic rhinitis   . GERD (gastroesophageal reflux disease)   . Kidney stones   . Chronic constipation   . Diverticulosis   . Vitamin D deficiency   . Hyperlipidemia   . Cerumen impaction     right   . Nasal congestion   . Sinusitis   . Palpitation   . Chest discomfort   . Osteopenia 09/2012    T score -1.9 FRAX 9.4%/1.3%  . Cervical dysplasia   . Scoliosis    Past Surgical History  Procedure Laterality Date  . Laparoscopic cholecystectomy    . Tubal ligation  1991  . Cardiac catheterization    . Excision of mole  2007  . Gynecologic cryosurgery    . Colposcopy     History   Social History  . Marital Status: Married    Spouse Name: N/A    Number of Children: N/A  . Years of Education: N/A   Occupational History  . Not on file.   Social History Main Topics  . Smoking status: Never Smoker   . Smokeless tobacco: Not on file  . Alcohol Use: Yes     Comment: rare  . Drug Use: No  . Sexual Activity: Yes    Birth Control/ Protection: Post-menopausal, Surgical     Comment: Tubal lig   Other Topics Concern  . Not on file   Social History Narrative  . No narrative on file   Family History  Problem Relation Age of Onset  . Hypertension Mother   . Heart disease Mother   . Hypertension Father   . Cancer Father     LUNG  . Diabetes Sister   . Hypertension Sister   . Hyperlipidemia Sister    Current Outpatient Prescriptions on File Prior to Visit  Medication Sig Dispense Refill  . acyclovir (ZOVIRAX) 800 MG tablet Take 1 tablet (800 mg total) by mouth 5 (five) times daily. For 10 days  50 tablet  1  .  ALPRAZolam (XANAX) 0.25 MG tablet Take 0.25 mg by mouth at bedtime as needed.        Marland Kitchen atorvastatin (LIPITOR) 20 MG tablet Take 20 mg by mouth daily.        Marland Kitchen BIOTIN PO Take by mouth.      Marland Kitchen CALCIUM-MAGNESIUM PO Take by mouth.        . Cyanocobalamin (VITAMIN B-12 PO) Take by mouth.        . lansoprazole (PREVACID) 30 MG capsule Take 30 mg by mouth daily.        Marland Kitchen loratadine (CLARITIN) 10 MG tablet Take 10 mg by mouth daily.        . predniSONE (DELTASONE) 20 MG tablet Take 2 tablets (40 mg total) by mouth daily. Daily for 2 days the 1 tab daily for 2 days the 1/2 tab daily for 2 days.  7 tablet  0  . Pseudoephedrine-Guaifenesin (MUCINEX D PO) Take by mouth.        . Pyridoxine HCl (VITAMIN B6 PO) Take by mouth.        Marland Kitchen  traMADol (ULTRAM) 50 MG tablet Take 1 tablet (50 mg total) by mouth every 8 (eight) hours as needed for pain.  20 tablet  0   No current facility-administered medications on file prior to visit.   Allergies  Allergen Reactions  . Sulfonamide Derivatives     REACTION: severe GI upset    There is no immunization history on file for this patient. Prior to Admission medications   Medication Sig Start Date End Date Taking? Authorizing Provider  acyclovir (ZOVIRAX) 800 MG tablet Take 1 tablet (800 mg total) by mouth 5 (five) times daily. For 10 days 09/20/12  Yes Ileana Ladd, MD  ALPRAZolam Prudy Feeler) 0.25 MG tablet Take 0.25 mg by mouth at bedtime as needed.     Yes Historical Provider, MD  atorvastatin (LIPITOR) 20 MG tablet Take 20 mg by mouth daily.     Yes Historical Provider, MD  BIOTIN PO Take by mouth.   Yes Historical Provider, MD  CALCIUM-MAGNESIUM PO Take by mouth.     Yes Historical Provider, MD  Cyanocobalamin (VITAMIN B-12 PO) Take by mouth.     Yes Historical Provider, MD  lansoprazole (PREVACID) 30 MG capsule Take 30 mg by mouth daily.     Yes Historical Provider, MD  loratadine (CLARITIN) 10 MG tablet Take 10 mg by mouth daily.     Yes Historical Provider, MD   predniSONE (DELTASONE) 20 MG tablet Take 2 tablets (40 mg total) by mouth daily. Daily for 2 days the 1 tab daily for 2 days the 1/2 tab daily for 2 days. 09/14/12  Yes Ileana Ladd, MD  Pseudoephedrine-Guaifenesin [facility] D PO) Take by mouth.     Yes Historical Provider, MD  Pyridoxine HCl (VITAMIN B6 PO) Take by mouth.     Yes Historical Provider, MD  traMADol (ULTRAM) 50 MG tablet Take 1 tablet (50 mg total) by mouth every 8 (eight) hours as needed for pain. 09/14/12  Yes Ileana Ladd, MD     ROS: As above in the HPI. All other systems are stable or negative.  OBJECTIVE: APPEARANCE:  Patient in no acute distress.The patient appeared well nourished and normally developed. Acyanotic. Waist: VITAL SIGNS:BP 128/80  Pulse 96  Temp(Src) 98.6 F (37 C) (Oral)  Wt 112 lb (50.803 kg)  BMI 22.61 kg/m2 WF  SKIN: warm and  Dry without overt tattoos and scars Right anterior thigh has a small red streak 1 cm long. No change  HEAD and Neck: without JVD, Head and scalp: normal Eyes:No scleral icterus. Fundi normal, eye movements normal. Ears: Auricle normal, canal normal, Tympanic membranes normal, insufflation normal. Nose: normal Throat: normal Neck & thyroid: normal  CHEST & LUNGS: Chest wall: normal Lungs: Clear  CVS: Reveals the PMI to be normally located. Regular rhythm, First and Second Heart sounds are normal,  absence of murmurs, rubs or gallops. Peripheral vasculature: Radial pulses: normal Dorsal pedis pulses: normal Posterior pulses: normal  ABDOMEN:  Appearance: normal Benign, no organomegaly, no masses, no Abdominal Aortic enlargement. No Guarding , no rebound. No Bruits. Bowel sounds: normal  RECTAL: N/A GU: N/A  EXTREMETIES: nonedematous.  MUSCULOSKELETAL:  Spine: normal Joints: intact  NEUROLOGIC: oriented to time,place and person; nonfocal. Strength is normal Sensory is normal Reflexes are normal Cranial Nerves are  normal.  ASSESSMENT: Lumbar radicular pain  Rash and nonspecific skin eruption  Better. May not be zoster. His may be a small dermatitis lesion. Back definitely better/resolved.  PLAN: No changes. Use meds up.  RTC prn  [name] P. Modesto Charon, M.D.

## 2012-10-24 ENCOUNTER — Telehealth: Payer: Self-pay

## 2012-10-24 NOTE — Telephone Encounter (Signed)
Patient called me about recent bone density test and was upset that her insurance did not pay for it and she had to.  She has already talked with Laurel Dimmer. In billing.  Her dexa was filed with osteopenia as the diagnosis. She is upset because she considers it a wellness exam stating she has had osteopenia for years.  Her ins pays 100% for wellness but with the diagnosis it went towards her $600 deductible which she had not met.  She said always before the ins paid for it and it was filed as wellness.  I explained to her that when there is a diagnosis available that we associate any test ordered with the diagnosis.  I also explained to her that changing a diagnosis in hindsight to manipulate the insurance co. to pay a claim can be considered fraud and [facility] could suffer repercussions.  She said she would probably not want to do it again if she was going to have to pay for it. I did remind her that I am not a billing dept employee and I this is all I can really tell her about it. She did not want to speak with Laurel Dimmer. Again as she has already.

## 2012-11-26 ENCOUNTER — Encounter: Payer: Self-pay | Admitting: *Deleted

## 2012-11-29 ENCOUNTER — Other Ambulatory Visit: Payer: Self-pay | Admitting: Family Medicine

## 2012-12-03 NOTE — Telephone Encounter (Signed)
Call patient : Prescription refilled & sent to pharmacy in [place]. 

## 2012-12-03 NOTE — Telephone Encounter (Signed)
Last seen 09/27/12 FPW  Last lipid 06/12/12  Patient wants 90 day supply

## 2012-12-06 ENCOUNTER — Other Ambulatory Visit (INDEPENDENT_AMBULATORY_CARE_PROVIDER_SITE_OTHER): Payer: 59

## 2012-12-06 DIAGNOSIS — E559 Vitamin D deficiency, unspecified: Secondary | ICD-10-CM

## 2012-12-06 DIAGNOSIS — E785 Hyperlipidemia, unspecified: Secondary | ICD-10-CM

## 2012-12-06 LAB — COMPLETE METABOLIC PANEL WITH GFR
ALT: 21 U/L (ref 0–35)
AST: 22 U/L (ref 0–37)
Albumin: 4.1 g/dL (ref 3.5–5.2)
Alkaline Phosphatase: 77 U/L (ref 39–117)
BUN: 14 mg/dL (ref 6–23)
CO2: 27 mEq/L (ref 19–32)
Calcium: 9.6 mg/dL (ref 8.4–10.5)
Chloride: 105 mEq/L (ref 96–112)
Creat: 0.68 mg/dL (ref 0.50–1.10)
GFR, Est African American: >89 mL/min
GFR, Est Non African American: >89 mL/min
Glucose, Bld: 85 mg/dL (ref 70–99)
Potassium: 4.3 mEq/L (ref 3.5–5.3)
Sodium: 141 mEq/L (ref 135–145)
Total Bilirubin: 0.4 mg/dL (ref 0.3–1.2)
Total Protein: 6.5 g/dL (ref 6.0–8.3)

## 2012-12-06 NOTE — Progress Notes (Signed)
Pt came in for labs only 

## 2012-12-07 LAB — NMR LIPOPROFILE WITH LIPIDS
Cholesterol, Total: 136 mg/dL (ref <200)
HDL Particle Number: 33.7 umol/L (ref >=30.5)
HDL Size: 9 nm — ABNORMAL LOW (ref >=9.2)
HDL-C: 49 mg/dL (ref >=40)
LDL (calc): 72 mg/dL (ref <100)
LDL Particle Number: 925 nmol/L (ref <1000)
LDL Size: 20.5 nm — ABNORMAL LOW (ref >20.5)
LP-IR Score: 59 — ABNORMAL HIGH (ref <=45)
Large HDL-P: 6.3 umol/L (ref >=4.8)
Large VLDL-P: 5 nmol/L — ABNORMAL HIGH (ref <=2.7)
Small LDL Particle Number: 473 nmol/L (ref <=527)
Triglycerides: 75 mg/dL (ref <150)
VLDL Size: 48.8 nm — ABNORMAL HIGH (ref <=46.6)

## 2012-12-07 LAB — VITAMIN D 25 HYDROXY (VIT D DEFICIENCY, FRACTURES): Vit D, 25-Hydroxy: 51 ng/mL (ref 30–89)

## 2012-12-13 ENCOUNTER — Ambulatory Visit: Payer: 59 | Admitting: Family Medicine

## 2012-12-17 ENCOUNTER — Encounter (INDEPENDENT_AMBULATORY_CARE_PROVIDER_SITE_OTHER): Payer: Self-pay

## 2012-12-17 ENCOUNTER — Encounter: Payer: Self-pay | Admitting: Family Medicine

## 2012-12-17 ENCOUNTER — Ambulatory Visit (INDEPENDENT_AMBULATORY_CARE_PROVIDER_SITE_OTHER): Payer: 59 | Admitting: Family Medicine

## 2012-12-17 VITALS — BP 131/82 | HR 84 | Temp 98.1°F | Ht 59.0 in | Wt 111.0 lb

## 2012-12-17 DIAGNOSIS — R030 Elevated blood-pressure reading, without diagnosis of hypertension: Secondary | ICD-10-CM

## 2012-12-17 DIAGNOSIS — IMO0001 Reserved for inherently not codable concepts without codable children: Secondary | ICD-10-CM

## 2012-12-17 DIAGNOSIS — E559 Vitamin D deficiency, unspecified: Secondary | ICD-10-CM

## 2012-12-17 DIAGNOSIS — M8580 Other specified disorders of bone density and structure, unspecified site: Secondary | ICD-10-CM

## 2012-12-17 DIAGNOSIS — M899 Disorder of bone, unspecified: Secondary | ICD-10-CM

## 2012-12-17 DIAGNOSIS — E785 Hyperlipidemia, unspecified: Secondary | ICD-10-CM | POA: Insufficient documentation

## 2012-12-17 DIAGNOSIS — K219 Gastro-esophageal reflux disease without esophagitis: Secondary | ICD-10-CM

## 2012-12-17 NOTE — Patient Instructions (Signed)
      Dr Ranae Casebier's Recommendations  For nutrition information, I recommend books:  1).Eat to Live by Dr Joel Fuhrman. 2).Prevent and Reverse Heart Disease by Dr Caldwell Esselstyn. 3) Dr Neal Barnard's Book:  Program to Reverse Diabetes  Exercise recommendations are:  If unable to walk, then the patient can exercise in a chair 3 times a day. By flapping arms like a bird gently and raising legs outwards to the front.  If ambulatory, the patient can go for walks for 30 minutes 3 times a week. Then increase the intensity and duration as tolerated.  Goal is to try to attain exercise frequency to 5 times a week.  If applicable: Best to perform resistance exercises (machines or weights) 2 days a week and cardio type exercises 3 days per week.  

## 2012-12-17 NOTE — Progress Notes (Signed)
Patient ID: [name], female   DOB: 09-Jan-1950, 62 y.o.   MRN: [id number] SUBJECTIVE: CC: Chief Complaint  Patient presents with  . Follow-up    follow up on chronic medical conditions    HPI: Patient is here for follow up of hyperlipidemia: denies Headache;denies Chest Pain;denies weakness;denies Shortness of Breath and orthopnea;denies Visual changes;denies palpitations;denies cough;denies pedal edema;denies symptoms of TIA or stroke;deniesClaudication symptoms. admits to Compliance with medications; denies Problems with medications.  Sinuses congestion. Using musinex. And a nose spray. Past Medical History  Diagnosis Date  . PUD (peptic ulcer disease)   . Allergic rhinitis   . GERD (gastroesophageal reflux disease)   . Kidney stones   . Chronic constipation   . Diverticulosis   . Vitamin D deficiency   . Cerumen impaction     right   . Nasal congestion   . Sinusitis   . Palpitation   . Chest discomfort   . Osteopenia 09/2012    T score -1.9 FRAX 9.4%/1.3%  . Cervical dysplasia   . Scoliosis   . Hyperlipidemia    Past Surgical History  Procedure Laterality Date  . Laparoscopic cholecystectomy    . Tubal ligation  1991  . Cardiac catheterization    . Excision of mole  2007  . Gynecologic cryosurgery    . Colposcopy     History   Social History  . Marital Status: Married    Spouse Name: N/A    Number of Children: N/A  . Years of Education: N/A   Occupational History  . Not on file.   Social History Main Topics  . Smoking status: Never Smoker   . Smokeless tobacco: Not on file  . Alcohol Use: Yes     Comment: rare  . Drug Use: No  . Sexual Activity: Yes    Birth Control/ Protection: Post-menopausal, Surgical     Comment: Tubal lig   Other Topics Concern  . Not on file   Social History Narrative  . No narrative on file   Family History  Problem Relation Age of Onset  . Hypertension Mother   . Heart disease Mother   . Hypertension Father   .  Cancer Father     LUNG  . Diabetes Sister   . Hypertension Sister   . Hyperlipidemia Sister    Current Outpatient Prescriptions on File Prior to Visit  Medication Sig Dispense Refill  . acyclovir (ZOVIRAX) 800 MG tablet Take 1 tablet (800 mg total) by mouth 5 (five) times daily. For 10 days  50 tablet  1  . ALPRAZolam (XANAX) 0.25 MG tablet Take 0.25 mg by mouth at bedtime as needed.        Marland Kitchen atorvastatin (LIPITOR) 20 MG tablet TAKE 1 TABLET EVERY DAY  90 tablet  0  . BIOTIN PO Take by mouth.      Marland Kitchen CALCIUM-MAGNESIUM PO Take by mouth.        . Cyanocobalamin (VITAMIN B-12 PO) Take by mouth.        . lansoprazole (PREVACID) 30 MG capsule Take 30 mg by mouth daily.        Marland Kitchen loratadine (CLARITIN) 10 MG tablet Take 10 mg by mouth daily.        . predniSONE (DELTASONE) 20 MG tablet Take 2 tablets (40 mg total) by mouth daily. Daily for 2 days the 1 tab daily for 2 days the 1/2 tab daily for 2 days.  7 tablet  0  . Pseudoephedrine-Guaifenesin [facility]  D PO) Take by mouth.        . Pyridoxine HCl (VITAMIN B6 PO) Take by mouth.        . traMADol (ULTRAM) 50 MG tablet Take 1 tablet (50 mg total) by mouth every 8 (eight) hours as needed for pain.  20 tablet  0   No current facility-administered medications on file prior to visit.   Allergies  Allergen Reactions  . Sulfonamide Derivatives     REACTION: severe GI upset    There is no immunization history on file for this patient. Prior to Admission medications   Medication Sig Start Date End Date Taking? Authorizing Provider  acyclovir (ZOVIRAX) 800 MG tablet Take 1 tablet (800 mg total) by mouth 5 (five) times daily. For 10 days 09/20/12  Yes Ileana Ladd, MD  ALPRAZolam Prudy Feeler) 0.25 MG tablet Take 0.25 mg by mouth at bedtime as needed.     Yes Historical Provider, MD  atorvastatin (LIPITOR) 20 MG tablet TAKE 1 TABLET EVERY DAY 11/29/12  Yes Ileana Ladd, MD  BIOTIN PO Take by mouth.   Yes Historical Provider, MD  CALCIUM-MAGNESIUM PO  Take by mouth.     Yes Historical Provider, MD  Cyanocobalamin (VITAMIN B-12 PO) Take by mouth.     Yes Historical Provider, MD  lansoprazole (PREVACID) 30 MG capsule Take 30 mg by mouth daily.     Yes Historical Provider, MD  loratadine (CLARITIN) 10 MG tablet Take 10 mg by mouth daily.     Yes Historical Provider, MD  predniSONE (DELTASONE) 20 MG tablet Take 2 tablets (40 mg total) by mouth daily. Daily for 2 days the 1 tab daily for 2 days the 1/2 tab daily for 2 days. 09/14/12  Yes Ileana Ladd, MD  Pseudoephedrine-Guaifenesin [facility] D PO) Take by mouth.     Yes Historical Provider, MD  Pyridoxine HCl (VITAMIN B6 PO) Take by mouth.     Yes Historical Provider, MD  traMADol (ULTRAM) 50 MG tablet Take 1 tablet (50 mg total) by mouth every 8 (eight) hours as needed for pain. 09/14/12  Yes Ileana Ladd, MD     ROS: As above in the HPI. All other systems are stable or negative.  OBJECTIVE: APPEARANCE:  Patient in no acute distress.The patient appeared well nourished and normally developed. Acyanotic. Waist: VITAL SIGNS:BP 131/82  Pulse 84  Temp(Src) 98.1 F (36.7 C) (Oral)  Ht 4\' 11"  (1.499 m)  Wt 111 lb (50.349 kg)  BMI 22.41 kg/m2 WF  SKIN: warm and  Dry without overt rashes, tattoos and scars  HEAD and Neck: without JVD, Head and scalp: normal Eyes:No scleral icterus. Fundi normal, eye movements normal. Ears: Auricle normal, canal normal, Tympanic membranes normal, insufflation normal. Nose: normal Throat: normal Neck & thyroid: normal  CHEST & LUNGS: Chest wall: normal Lungs: Clear  CVS: Reveals the PMI to be normally located. Regular rhythm, First and Second Heart sounds are normal,  absence of murmurs, rubs or gallops. Peripheral vasculature: Radial pulses: normal Dorsal pedis pulses: normal Posterior pulses: normal  ABDOMEN:  Appearance: normal Benign, no organomegaly, no masses, no Abdominal Aortic enlargement. No Guarding , no rebound. No  Bruits. Bowel sounds: normal  RECTAL: N/A GU: N/A  EXTREMETIES: nonedematous.  MUSCULOSKELETAL:  Spine: normal Joints: intact  NEUROLOGIC: oriented to time,place and person; nonfocal. Strength is normal Sensory is normal Reflexes are normal Cranial Nerves are normal. Results for orders placed in visit on 12/06/12  COMPLETE METABOLIC PANEL WITH GFR  Result Value Range   Sodium 141  135 - 145 mEq/L   Potassium 4.3  3.5 - 5.3 mEq/L   Chloride 105  96 - 112 mEq/L   CO2 27  19 - 32 mEq/L   Glucose, Bld 85  70 - 99 mg/dL   BUN 14  6 - 23 mg/dL   Creat 1.61  0.96 - 0.45 mg/dL   Total Bilirubin 0.4  0.3 - 1.2 mg/dL   Alkaline Phosphatase 77  39 - 117 U/L   AST 22  0 - 37 U/L   ALT 21  0 - 35 U/L   Total Protein 6.5  6.0 - 8.3 g/dL   Albumin 4.1  3.5 - 5.2 g/dL   Calcium 9.6  8.4 - 40.9 mg/dL   GFR, Est African American >89     GFR, Est Non African American >89    NMR LIPOPROFILE WITH LIPIDS      Result Value Range   LDL Particle Number 925  <1000 nmol/L   LDL (calc) 72  <100 mg/dL   HDL-C 49  >=81 mg/dL   Triglycerides 75  <191 mg/dL   Cholesterol, Total 478  <200 mg/dL   HDL Particle Number 29.5  >=62.1 umol/L   Large HDL-P 6.3  >=4.8 umol/L   Large VLDL-P 5.0 (*) <=2.7 nmol/L   Small LDL Particle Number 473  <=527 nmol/L   LDL Size 20.5 (*) >20.5 nm   HDL Size 9.0 (*) >=9.2 nm   VLDL Size 48.8 (*) <=46.6 nm   LP-IR Score 59 (*) <=45  VITAMIN D 25 HYDROXY      Result Value Range   Vit D, 25-Hydroxy 51  30 - 89 ng/mL    ASSESSMENT: Elevated BP - Plan: CMP14+EGFR  G E R D  Osteopenia  Unspecified vitamin D deficiency - Plan: Vit D  25 hydroxy (rtn osteoporosis monitoring)  Hyperlipidemia - Plan: CMP14+EGFR, NMR, lipoprofile  PLAN:      Dr Woodroe Mode Recommendations  For nutrition information, I recommend books:  1).Eat to Live by Dr Monico Hoar. 2).Prevent and Reverse Heart Disease by Dr Suzzette Righter. 3) Dr Katherina Right Book:  Program  to Reverse Diabetes  Exercise recommendations are:  If unable to walk, then the patient can exercise in a chair 3 times a day. By flapping arms like a bird gently and raising legs outwards to the front.  If ambulatory, the patient can go for walks for 30 minutes 3 times a week. Then increase the intensity and duration as tolerated.  Goal is to try to attain exercise frequency to 5 times a week.  If applicable: Best to perform resistance exercises (machines or weights) 2 days a week and cardio type exercises 3 days per week.   Orders Placed This Encounter  Procedures  . CMP14+EGFR    Standing Status: Future     Number of Occurrences:      Standing Expiration Date: 12/17/2013    Order Specific Question:  Has the patient fasted?    Answer:  Yes  . NMR, lipoprofile    Standing Status: Future     Number of Occurrences:      Standing Expiration Date: 12/17/2013  . Vit D  25 hydroxy (rtn osteoporosis monitoring)    Standing Status: Future     Number of Occurrences:      Standing Expiration Date: 12/17/2013   No orders of the defined types were placed in this encounter.   There are no  discontinued medications. Return in about 6 months (around 06/17/2013) for Recheck medical problems.  Marlos Carmen P. [place]esto Charon, M.D.

## 2012-12-20 ENCOUNTER — Telehealth: Payer: Self-pay

## 2012-12-20 NOTE — Telephone Encounter (Signed)
Husband called regarding patient's bone density bill and how it was filed. I have spoke with patient previously about this as I know [name] H. And [name]. Have as well.  He said the ins. Co. Told his wife that we needed to change the diagnosis on her bone scan to wellness so it could pay.  I explained again " that when there is a diagnosis available that we associate any test ordered with the diagnosis. I also explained to her that changing a diagnosis in hindsight to manipulate the insurance co. to pay a claim can be considered fraud and [facility] could suffer repercussions."  He said his wife is very upset and that is why he is calling.  I told him I was not sure how he reached me as I am the surgery scheduler and have nothing to do with billing/ins. I told him I would have someone call him.  I relayed all of this conversation to [employer]. And she staff messaged PH asking her to call him.

## 2013-03-01 ENCOUNTER — Other Ambulatory Visit: Payer: Self-pay | Admitting: Family Medicine

## 2013-03-06 ENCOUNTER — Other Ambulatory Visit: Payer: Self-pay | Admitting: Family Medicine

## 2013-04-12 ENCOUNTER — Encounter: Payer: Self-pay | Admitting: *Deleted

## 2013-04-23 ENCOUNTER — Encounter: Payer: Self-pay | Admitting: *Deleted

## 2013-04-24 ENCOUNTER — Other Ambulatory Visit: Payer: Self-pay | Admitting: *Deleted

## 2013-04-24 NOTE — Telephone Encounter (Signed)
Last ov 12/14. 

## 2013-04-26 MED ORDER — LANSOPRAZOLE 30 MG PO CPDR: 30.0000 mg | DELAYED_RELEASE_CAPSULE | Freq: Every day | ORAL | Status: DC

## 2013-04-26 NOTE — Telephone Encounter (Signed)
Call patient : Prescription refilled & sent to pharmacy in [place]. 

## 2013-06-03 ENCOUNTER — Other Ambulatory Visit: Payer: Self-pay | Admitting: Family Medicine

## 2013-06-18 ENCOUNTER — Ambulatory Visit (INDEPENDENT_AMBULATORY_CARE_PROVIDER_SITE_OTHER): Payer: 59 | Admitting: Family

## 2013-06-18 ENCOUNTER — Encounter: Payer: Self-pay | Admitting: Family

## 2013-06-18 ENCOUNTER — Other Ambulatory Visit (INDEPENDENT_AMBULATORY_CARE_PROVIDER_SITE_OTHER): Payer: 59

## 2013-06-18 VITALS — BP 131/88 | HR 105 | Temp 98.1°F | Ht 59.0 in | Wt 107.8 lb

## 2013-06-18 DIAGNOSIS — E559 Vitamin D deficiency, unspecified: Secondary | ICD-10-CM

## 2013-06-18 DIAGNOSIS — E785 Hyperlipidemia, unspecified: Secondary | ICD-10-CM

## 2013-06-18 DIAGNOSIS — J019 Acute sinusitis, unspecified: Secondary | ICD-10-CM

## 2013-06-18 MED ORDER — AMOXICILLIN-POT CLAVULANATE 875-125 MG PO TABS: 1.0000 | ORAL_TABLET | Freq: Two times a day (BID) | ORAL | Status: DC

## 2013-06-18 NOTE — Patient Instructions (Addendum)
Sinusitis Sinusitis is redness, soreness, and swelling (inflammation) of the paranasal sinuses. Paranasal sinuses are air pockets within the bones of your face (beneath the eyes, the middle of the forehead, or above the eyes). In healthy paranasal sinuses, mucus is able to drain out, and air is able to circulate through them by way of your nose. However, when your paranasal sinuses are inflamed, mucus and air can become trapped. This can allow bacteria and other germs to grow and cause infection. Sinusitis can develop quickly and last only a short time (acute) or continue over a long period (chronic). Sinusitis that lasts for more than 12 weeks is considered chronic.  CAUSES  Causes of sinusitis include:  Allergies.  Structural abnormalities, such as displacement of the cartilage that separates your nostrils (deviated septum), which can decrease the air flow through your nose and sinuses and affect sinus drainage.  Functional abnormalities, such as when the small hairs (cilia) that line your sinuses and help remove mucus do not work properly or are not present. SYMPTOMS  Symptoms of acute and chronic sinusitis are the same. The primary symptoms are pain and pressure around the affected sinuses. Other symptoms include:  Upper toothache.  Earache.  Headache.  Bad breath.  Decreased sense of smell and taste.  A cough, which worsens when you are lying flat.  Fatigue.  Fever.  Thick drainage from your nose, which often is green and may contain pus (purulent).  Swelling and warmth over the affected sinuses. DIAGNOSIS  Your caregiver will perform a physical exam. During the exam, your caregiver may:  Look in your nose for signs of abnormal growths in your nostrils (nasal polyps).  Tap over the affected sinus to check for signs of infection.  View the inside of your sinuses (endoscopy) with a special imaging device with a light attached (endoscope), which is inserted into your  sinuses. If your caregiver suspects that you have chronic sinusitis, one or more of the following tests may be recommended:  Allergy tests.  Nasal culture A sample of mucus is taken from your nose and sent to a lab and screened for bacteria.  Nasal cytology A sample of mucus is taken from your nose and examined by your caregiver to determine if your sinusitis is related to an allergy. TREATMENT  Most cases of acute sinusitis are related to a viral infection and will resolve on their own within 10 days. Sometimes medicines are prescribed to help relieve symptoms (pain medicine, decongestants, nasal steroid sprays, or saline sprays).  However, for sinusitis related to a bacterial infection, your caregiver will prescribe antibiotic medicines. These are medicines that will help kill the bacteria causing the infection.  Rarely, sinusitis is caused by a fungal infection. In theses cases, your caregiver will prescribe antifungal medicine. For some cases of chronic sinusitis, surgery is needed. Generally, these are cases in which sinusitis recurs more than 3 times per year, despite other treatments. HOME CARE INSTRUCTIONS   Drink plenty of water. Water helps thin the mucus so your sinuses can drain more easily.  Use a humidifier.  Inhale steam 3 to 4 times a day (for example, sit in the bathroom with the shower running).  Apply a warm, moist washcloth to your face 3 to 4 times a day, or as directed by your caregiver.  Use saline nasal sprays to help moisten and clean your sinuses.  Take over-the-counter or prescription medicines for pain, discomfort, or fever only as directed by your caregiver. SEEK IMMEDIATE MEDICAL   CARE IF:  You have increasing pain or severe headaches.  You have nausea, vomiting, or drowsiness.  You have swelling around your face.  You have vision problems.  You have a stiff neck.  You have difficulty breathing. MAKE SURE YOU:   Understand these  instructions.  Will watch your condition.  Will get help right away if you are not doing well or get worse. Document Released: 12/20/2004 Document Revised: 03/14/2011 Document Reviewed: 01/04/2011 ExitCare Patient Information 2014 [place], [place].  - Take meds as prescribed - Use a cool mist humidifier  -Use saline nose sprays frequently -Saline irrigations of the nose can be very helpful if done frequently.  * 4X daily for 1 week*  * Use of a nettie pot can be helpful with this. Follow directions with this* -Force fluids -For any cough or congestion  Use plain Mucinex- regular strength or max strength is fine   * Children- consult with Pharmacist for dosing -For fever or aces or pains- take tylenol or ibuprofen appropriate for age and weight.  * for fevers greater than 101 orally you may alternate ibuprofen and tylenol every  3 hours. -Throat lozenges if help  Evelina Dun, FNP

## 2013-06-18 NOTE — Progress Notes (Signed)
   Subjective:    Patient ID: [name], female    DOB: October 30, 1950, 63 y.o.   MRN: [id number]  Sinusitis This is a new problem. The current episode started today. The problem is unchanged. There has been no fever. Her pain is at a severity of 7/10. The pain is moderate. Associated symptoms include congestion, ear pain, headaches, a hoarse voice, sinus pressure, sneezing and a sore throat. Pertinent negatives include no chills, coughing or shortness of breath. Past treatments include acetaminophen. The treatment provided moderate relief.      Review of Systems  Constitutional: Negative for chills.  HENT: Positive for congestion, ear pain, hoarse voice, sinus pressure, sneezing and sore throat.   Respiratory: Negative.  Negative for cough and shortness of breath.   Cardiovascular: Negative.   Gastrointestinal: Negative.   Genitourinary: Negative.   Musculoskeletal: Negative.   Neurological: Positive for headaches.  Hematological: Negative.   All other systems reviewed and are negative.      Objective:   Physical Exam  Vitals reviewed. Constitutional: She is oriented to person, place, and time. She appears well-developed and well-nourished. No distress.  HENT:  Head: Normocephalic and atraumatic.  Right Ear: External ear normal.  Left Ear: External ear normal.  Nose: Right sinus exhibits maxillary sinus tenderness and frontal sinus tenderness. Left sinus exhibits maxillary sinus tenderness and frontal sinus tenderness.  Mouth/Throat: Oropharynx is clear and moist.  Nasal passage mildly erythemas with swelling  Eyes: Pupils are equal, round, and reactive to light.  Neck: Normal range of motion. Neck supple. No thyromegaly present.  Cardiovascular: Normal rate, regular rhythm, normal heart sounds and intact distal pulses.   No murmur heard. Pulmonary/Chest: Effort normal and breath sounds normal. No respiratory distress. She has no wheezes.  Abdominal: Soft. Bowel sounds are  normal. She exhibits no distension. There is no tenderness.  Musculoskeletal: Normal range of motion. She exhibits no edema and no tenderness.  Neurological: She is alert and oriented to person, place, and time. She has normal reflexes. No cranial nerve deficit.  Skin: Skin is warm and dry.  Psychiatric: She has a normal mood and affect. Her behavior is normal. Judgment and thought content normal.      BP 131/88  Pulse 105  Temp(Src) 98.1 F (36.7 C) (Oral)  Ht $R'4\' 11"'qC$  (1.499 m)  Wt 107 lb 12.8 oz (48.898 kg)  BMI 21.76 kg/m2     Assessment & Plan:  1. Unspecified vitamin D deficiency - CMP14+EGFR - NMR, lipoprofile  2. Other and unspecified hyperlipidemia - Vit D  25 hydroxy (rtn osteoporosis monitoring)  3. Sinusitis, acute -- Take meds as prescribed - Use a cool mist humidifier  -Use saline nose sprays frequently -Saline irrigations of the nose can be very helpful if done frequently.  * 4X daily for 1 week*  * Use of a nettie pot can be helpful with this. Follow directions with this* -Force fluids -For any cough or congestion  Use plain Mucinex- regular strength or max strength is fine   * Children- consult with Pharmacist for dosing -For fever or aces or pains- take tylenol or ibuprofen appropriate for age and weight.  * for fevers greater than 101 orally you may alternate ibuprofen and tylenol every  3 hours. -Throat lozenges if help - amoxicillin-clavulanate (AUGMENTIN) 875-125 MG per tablet; Take 1 tablet by mouth 2 (two) times daily.  Dispense: 20 tablet; Refill: 0  Evelina Dun, FNP

## 2013-06-19 LAB — CMP14+EGFR
ALT: 22 IU/L (ref 0–32)
AST: 20 IU/L (ref 0–40)
Albumin/Globulin Ratio: 2.3 (ref 1.1–2.5)
Albumin: 4.5 g/dL (ref 3.6–4.8)
Alkaline Phosphatase: 83 IU/L (ref 39–117)
BUN/Creatinine Ratio: 18 (ref 11–26)
BUN: 13 mg/dL (ref 8–27)
CO2: 27 mmol/L (ref 18–29)
Calcium: 10.1 mg/dL (ref 8.7–10.3)
Chloride: 100 mmol/L (ref 97–108)
Creatinine, Ser: 0.71 mg/dL (ref 0.57–1.00)
GFR calc Af Amer: 104 mL/min/{1.73_m2} (ref >59)
GFR calc non Af Amer: 90 mL/min/{1.73_m2} (ref >59)
Globulin, Total: 2 g/dL (ref 1.5–4.5)
Glucose: 96 mg/dL (ref 65–99)
Potassium: 3.9 mmol/L (ref 3.5–5.2)
Sodium: 141 mmol/L (ref 134–144)
Total Bilirubin: 0.4 mg/dL (ref 0.0–1.2)
Total Protein: 6.5 g/dL (ref 6.0–8.5)

## 2013-06-19 LAB — NMR, LIPOPROFILE
Cholesterol: 149 mg/dL (ref 100–199)
HDL Cholesterol by NMR: 54 mg/dL (ref >39)
HDL Particle Number: 38 umol/L (ref >=30.5)
LDL Particle Number: 859 nmol/L (ref <1000)
LDL Size: 20.3 nm (ref >20.5)
LDLC SERPL CALC-MCNC: 73 mg/dL (ref 0–99)
LP-IR Score: 54 — ABNORMAL HIGH (ref <=45)
Small LDL Particle Number: 477 nmol/L (ref <=527)
Triglycerides by NMR: 111 mg/dL (ref 0–149)

## 2013-06-19 LAB — VITAMIN D 25 HYDROXY (VIT D DEFICIENCY, FRACTURES): Vit D, 25-Hydroxy: 35.4 ng/mL (ref 30.0–100.0)

## 2013-06-25 ENCOUNTER — Other Ambulatory Visit: Payer: Self-pay | Admitting: Gynecology

## 2013-06-25 ENCOUNTER — Ambulatory Visit: Payer: 59 | Admitting: Family Medicine

## 2013-06-25 DIAGNOSIS — Z1231 Encounter for screening mammogram for malignant neoplasm of breast: Secondary | ICD-10-CM

## 2013-06-26 ENCOUNTER — Ambulatory Visit (INDEPENDENT_AMBULATORY_CARE_PROVIDER_SITE_OTHER): Payer: 59 | Admitting: Family Medicine

## 2013-06-26 ENCOUNTER — Telehealth: Payer: Self-pay | Admitting: Family Medicine

## 2013-06-26 ENCOUNTER — Encounter: Payer: Self-pay | Admitting: Family Medicine

## 2013-06-26 VITALS — BP 153/78 | HR 93 | Temp 98.3°F | Ht 59.0 in | Wt 107.4 lb

## 2013-06-26 DIAGNOSIS — K2100 Gastro-esophageal reflux disease with esophagitis, without bleeding: Secondary | ICD-10-CM

## 2013-06-26 DIAGNOSIS — E785 Hyperlipidemia, unspecified: Secondary | ICD-10-CM

## 2013-06-26 MED ORDER — ATORVASTATIN CALCIUM 20 MG PO TABS: 20.0000 mg | ORAL_TABLET | Freq: Every day | ORAL | Status: DC

## 2013-06-26 MED ORDER — LANSOPRAZOLE 30 MG PO CPDR: 30.0000 mg | DELAYED_RELEASE_CAPSULE | Freq: Every day | ORAL | Status: DC

## 2013-06-26 NOTE — Telephone Encounter (Signed)
Message copied by Waverly Ferrari on Wed Jun 26, 2013  9:18 AM ------      Message from: Lysbeth Penner      Created: Tue Jun 25, 2013  5:44 PM       Labs look good ------

## 2013-06-26 NOTE — Progress Notes (Signed)
   Subjective:    Patient ID: MONQUE HAGGAR, female    DOB: 09-03-1950, 63 y.o.   MRN: [id number]  HPI  This 63 y.o. female presents for evaluation of routine follow up.  She has hx of hyperlipidemia and  GERD.  She is needing refills.  She has done labs prior to visit and they are normal.  She is up to  Date with BMD, pap, mammo, and colonoscopy.  Review of Systems    No chest pain, SOB, HA, dizziness, vision change, N/V, diarrhea, constipation, dysuria, urinary urgency or frequency, myalgias, arthralgias or rash.  Objective:   Physical Exam  Vital signs noted  Well developed well nourished female.  HEENT - Head atraumatic Normocephalic                Eyes - PERRLA, Conjuctiva - clear Sclera- Clear EOMI                Ears - EAC's Wnl TM's Wnl Gross Hearing WNL                Nose - Nares patent                 Throat - oropharanx wnl Respiratory - Lungs CTA bilateral Cardiac - RRR S1 and S2 without murmur GI - Abdomen soft Nontender and bowel sounds active x 4 Extremities - No edema. Neuro - Grossly intact.      Assessment & Plan:  .Other and unspecified hyperlipidemia - Plan: atorvastatin (LIPITOR) 20 MG tablet, lansoprazole (PREVACID) 30 MG capsule  Gastroesophageal reflux disease with esophagitis - Plan: atorvastatin (LIPITOR) 20 MG tablet, lansoprazole (PREVACID) 30 MG capsule  Follow up in 6 months  Lysbeth Penner FNP

## 2013-08-02 ENCOUNTER — Encounter: Payer: Self-pay | Admitting: Gynecology

## 2013-08-02 ENCOUNTER — Ambulatory Visit (HOSPITAL_COMMUNITY)
Admission: RE | Admit: 2013-08-02 | Discharge: 2013-08-02 | Disposition: A | Discharge status: Home / Self Care | Payer: 59 | Source: Ambulatory Visit | Attending: Gynecology | Admitting: Gynecology

## 2013-08-02 ENCOUNTER — Ambulatory Visit (INDEPENDENT_AMBULATORY_CARE_PROVIDER_SITE_OTHER): Payer: 59 | Admitting: Gynecology

## 2013-08-02 VITALS — BP 124/78 | Ht 59.0 in | Wt 106.0 lb

## 2013-08-02 DIAGNOSIS — M949 Disorder of cartilage, unspecified: Secondary | ICD-10-CM

## 2013-08-02 DIAGNOSIS — N952 Postmenopausal atrophic vaginitis: Secondary | ICD-10-CM

## 2013-08-02 DIAGNOSIS — Z1231 Encounter for screening mammogram for malignant neoplasm of breast: Secondary | ICD-10-CM | POA: Diagnosis not present

## 2013-08-02 DIAGNOSIS — Z01419 Encounter for gynecological examination (general) (routine) without abnormal findings: Secondary | ICD-10-CM

## 2013-08-02 DIAGNOSIS — M8580 Other specified disorders of bone density and structure, unspecified site: Secondary | ICD-10-CM

## 2013-08-02 DIAGNOSIS — M899 Disorder of bone, unspecified: Secondary | ICD-10-CM

## 2013-08-02 NOTE — Patient Instructions (Signed)
Followup for annual exam in one year, sooner if any issues.  You may obtain a copy of any labs that were done today by logging onto MyChart as outlined in the instructions provided with your AVS (after visit summary). The office will not call with normal lab results but certainly if there are any significant abnormalities then we will contact you.   Health Maintenance, Female A healthy lifestyle and preventative care can promote health and wellness.  Maintain regular health, dental, and eye exams.  Eat a healthy diet. Foods like vegetables, fruits, whole grains, low-fat dairy products, and lean protein foods contain the nutrients you need without too many calories. Decrease your intake of foods high in solid fats, added sugars, and salt. Get information about a proper diet from your caregiver, if necessary.  Regular physical exercise is one of the most important things you can do for your health. Most adults should get at least 150 minutes of moderate-intensity exercise (any activity that increases your heart rate and causes you to sweat) each week. In addition, most adults need muscle-strengthening exercises on 2 or more days a week.   Maintain a healthy weight. The body mass index (BMI) is a screening tool to identify possible weight problems. It provides an estimate of body fat based on height and weight. Your caregiver can help determine your BMI, and can help you achieve or maintain a healthy weight. For adults 20 years and older:  A BMI below 18.5 is considered underweight.  A BMI of 18.5 to 24.9 is normal.  A BMI of 25 to 29.9 is considered overweight.  A BMI of 30 and above is considered obese.  Maintain normal blood lipids and cholesterol by exercising and minimizing your intake of saturated fat. Eat a balanced diet with plenty of fruits and vegetables. Blood tests for lipids and cholesterol should begin at age 20 and be repeated every 5 years. If your lipid or cholesterol levels are  high, you are over 50, or you are a high risk for heart disease, you may need your cholesterol levels checked more frequently.Ongoing high lipid and cholesterol levels should be treated with medicines if diet and exercise are not effective.  If you smoke, find out from your caregiver how to quit. If you do not use tobacco, do not start.  Lung cancer screening is recommended for adults aged 55 80 years who are at high risk for developing lung cancer because of a history of smoking. Yearly low-dose computed tomography (CT) is recommended for people who have at least a 30-pack-year history of smoking and are a current smoker or have quit within the past 15 years. A pack year of smoking is smoking an average of 1 pack of cigarettes a day for 1 year (for example: 1 pack a day for 30 years or 2 packs a day for 15 years). Yearly screening should continue until the smoker has stopped smoking for at least 15 years. Yearly screening should also be stopped for people who develop a health problem that would prevent them from having lung cancer treatment.  If you are pregnant, do not drink alcohol. If you are breastfeeding, be very cautious about drinking alcohol. If you are not pregnant and choose to drink alcohol, do not exceed 1 drink per day. One drink is considered to be 12 ounces (355 mL) of beer, 5 ounces (148 mL) of wine, or 1.5 ounces (44 mL) of liquor.  Avoid use of street drugs. Do not share needles with   anyone. Ask for help if you need support or instructions about stopping the use of drugs.  High blood pressure causes heart disease and increases the risk of stroke. Blood pressure should be checked at least every 1 to 2 years. Ongoing high blood pressure should be treated with medicines, if weight loss and exercise are not effective.  If you are 55 to 64 years old, ask your caregiver if you should take aspirin to prevent strokes.  Diabetes screening involves taking a blood sample to check your fasting  blood sugar level. This should be done once every 3 years, after age 45, if you are within normal weight and without risk factors for diabetes. Testing should be considered at a younger age or be carried out more frequently if you are overweight and have at least 1 risk factor for diabetes.  Breast cancer screening is essential preventative care for women. You should practice "breast self-awareness." This means understanding the normal appearance and feel of your breasts and may include breast self-examination. Any changes detected, no matter how small, should be reported to a caregiver. Women in their 20s and 30s should have a clinical breast exam (CBE) by a caregiver as part of a regular health exam every 1 to 3 years. After age 40, women should have a CBE every year. Starting at age 40, women should consider having a mammogram (breast X-ray) every year. Women who have a family history of breast cancer should talk to their caregiver about genetic screening. Women at a high risk of breast cancer should talk to their caregiver about having an MRI and a mammogram every year.  Breast cancer gene (BRCA)-related cancer risk assessment is recommended for women who have family members with BRCA-related cancers. BRCA-related cancers include breast, ovarian, tubal, and peritoneal cancers. Having family members with these cancers may be associated with an increased risk for harmful changes (mutations) in the breast cancer genes BRCA1 and BRCA2. Results of the assessment will determine the need for genetic counseling and BRCA1 and BRCA2 testing.  The Pap test is a screening test for cervical cancer. Women should have a Pap test starting at age 21. Between ages 21 and 29, Pap tests should be repeated every 2 years. Beginning at age 30, you should have a Pap test every 3 years as long as the past 3 Pap tests have been normal. If you had a hysterectomy for a problem that was not cancer or a condition that could lead to  cancer, then you no longer need Pap tests. If you are between ages 65 and 70, and you have had normal Pap tests going back 10 years, you no longer need Pap tests. If you have had past treatment for cervical cancer or a condition that could lead to cancer, you need Pap tests and screening for cancer for at least 20 years after your treatment. If Pap tests have been discontinued, risk factors (such as a new sexual partner) need to be reassessed to determine if screening should be resumed. Some women have medical problems that increase the chance of getting cervical cancer. In these cases, your caregiver may recommend more frequent screening and Pap tests.  The human papillomavirus (HPV) test is an additional test that may be used for cervical cancer screening. The HPV test looks for the virus that can cause the cell changes on the cervix. The cells collected during the Pap test can be tested for HPV. The HPV test could be used to screen women aged 30   years and older, and should be used in women of any age who have unclear Pap test results. After the age of 30, women should have HPV testing at the same frequency as a Pap test.  Colorectal cancer can be detected and often prevented. Most routine colorectal cancer screening begins at the age of 50 and continues through age 75. However, your caregiver may recommend screening at an earlier age if you have risk factors for colon cancer. On a yearly basis, your caregiver may provide home test kits to check for hidden blood in the stool. Use of a small camera at the end of a tube, to directly examine the colon (sigmoidoscopy or colonoscopy), can detect the earliest forms of colorectal cancer. Talk to your caregiver about this at age 50, when routine screening begins. Direct examination of the colon should be repeated every 5 to 10 years through age 75, unless early forms of pre-cancerous polyps or small growths are found.  Hepatitis C blood testing is recommended for  all people born from 1945 through 1965 and any individual with known risks for hepatitis C.  Practice safe sex. Use condoms and avoid high-risk sexual practices to reduce the spread of sexually transmitted infections (STIs). Sexually active women aged 25 and younger should be checked for Chlamydia, which is a common sexually transmitted infection. Older women with new or multiple partners should also be tested for Chlamydia. Testing for other STIs is recommended if you are sexually active and at increased risk.  Osteoporosis is a disease in which the bones lose minerals and strength with aging. This can result in serious bone fractures. The risk of osteoporosis can be identified using a bone density scan. Women ages 65 and over and women at risk for fractures or osteoporosis should discuss screening with their caregivers. Ask your caregiver whether you should be taking a calcium supplement or vitamin D to reduce the rate of osteoporosis.  Menopause can be associated with physical symptoms and risks. Hormone replacement therapy is available to decrease symptoms and risks. You should talk to your caregiver about whether hormone replacement therapy is right for you.  Use sunscreen. Apply sunscreen liberally and repeatedly throughout the day. You should seek shade when your shadow is shorter than you. Protect yourself by wearing long sleeves, pants, a wide-brimmed hat, and sunglasses year round, whenever you are outdoors.  Notify your caregiver of new moles or changes in moles, especially if there is a change in shape or color. Also notify your caregiver if a mole is larger than the size of a pencil eraser.  Stay current with your immunizations. Document Released: 07/05/2010 Document Revised: 04/16/2012 Document Reviewed: 07/05/2010 [employer] Patient Information 2014 [employer].   

## 2013-08-02 NOTE — Progress Notes (Signed)
[name] 21-Mar-1950 [id number]        63 y.o.  G1P1001 for annual exam.  Several issues noted below.  Past medical history,surgical history, problem list, medications, allergies, family history and social history were all reviewed and documented as reviewed in the EPIC chart.  ROS:  12 system ROS performed with pertinent positives and negatives included in the history, assessment and plan.   Additional significant findings :  None   Exam: Kim Counsellor Vitals:   08/02/13 1443  BP: 124/78  Height: 4\' 11"  (1.499 m)  Weight: 106 lb (48.081 kg)   General appearance:  Normal affect, orientation and appearance. Skin: Grossly normal HEENT: Without gross lesions.  No cervical or supraclavicular adenopathy. Thyroid normal.  Lungs:  Clear without wheezing, rales or rhonchi Cardiac: RR, without RMG Abdominal:  Soft, nontender, without masses, guarding, rebound, organomegaly or hernia Breasts:  Examined lying and sitting without masses, retractions, discharge or axillary adenopathy. Pelvic:  Ext/BUS/vagina with generalized atrophic changes  Cervix with atrophic changes  Uterus anteverted, normal size, shape and contour, midline and mobile nontender   Adnexa  Without masses or tenderness    Anus and perineum  Normal   Rectovaginal  Normal sphincter tone without palpated masses or tenderness.    Assessment/Plan:  63 y.o. G27P1001 female for annual exam.   1. Postmenopausal/atrophic genital changes. Patient without significant symptoms of hot flushes, night sweats, vaginal dryness or dyspareunia. No vaginal bleeding. Continue to monitor. Report any vaginal bleeding. 2. Osteopenia.  DEXA 09/2012 T score -1.9 FRAX 9.4%/1.3%. Slight decline at right hip stable at left hip and spine from prior DEXA 2012. Repeat DEXA next year at two-year interval. Increase calcium vitamin D reviewed. 3. Pap smear 2013. No Pap smear done today. History of cryosurgery over 30 years ago with normal Pap smears  since then. Plan repeat Pap smear next year a 3 year interval. 4. Mammography today. Continue with annual mammography. SBE monthly reviewed. 5. Colonoscopy 2011. Repeat at their recommended interval. 6. Health maintenance. No blood work done as she reports this done at her primary physician's office. Followup one year, sooner as needed.   Note: This document was prepared with digital dictation and possible smart phrase technology. Any transcriptional errors that result from this process are unintentional.   Anastasio Auerbach MD, 3:08 PM 08/02/2013

## 2013-08-03 LAB — URINALYSIS W MICROSCOPIC + REFLEX CULTURE
Bacteria, UA: NONE SEEN
Bilirubin Urine: NEGATIVE
Casts: NONE SEEN
Crystals: NONE SEEN
Glucose, UA: NEGATIVE mg/dL
Hgb urine dipstick: NEGATIVE
Ketones, ur: NEGATIVE mg/dL
NITRITE: NEGATIVE
PH: 5.5 (ref 5.0–8.0)
PROTEIN: 30 mg/dL — AB
Specific Gravity, Urine: 1.024 (ref 1.005–1.030)
Squamous Epithelial / LPF: NONE SEEN
Urobilinogen, UA: 0.2 mg/dL (ref 0.0–1.0)

## 2013-08-04 LAB — URINE CULTURE
COLONY COUNT: NO GROWTH
ORGANISM ID, BACTERIA: NO GROWTH

## 2013-10-23 ENCOUNTER — Encounter: Payer: Self-pay | Admitting: Family Medicine

## 2013-10-23 ENCOUNTER — Ambulatory Visit (INDEPENDENT_AMBULATORY_CARE_PROVIDER_SITE_OTHER): Payer: 59 | Admitting: Family Medicine

## 2013-10-23 VITALS — BP 138/86 | HR 80 | Temp 97.4°F | Ht 59.0 in | Wt 107.0 lb

## 2013-10-23 DIAGNOSIS — J301 Allergic rhinitis due to pollen: Secondary | ICD-10-CM

## 2013-10-23 DIAGNOSIS — J069 Acute upper respiratory infection, unspecified: Secondary | ICD-10-CM

## 2013-10-23 DIAGNOSIS — J209 Acute bronchitis, unspecified: Secondary | ICD-10-CM

## 2013-10-23 DIAGNOSIS — J029 Acute pharyngitis, unspecified: Secondary | ICD-10-CM

## 2013-10-23 LAB — POCT RAPID STREP A (OFFICE): Rapid Strep A Screen: NEGATIVE

## 2013-10-23 MED ORDER — AZITHROMYCIN 250 MG PO TABS: ORAL_TABLET | ORAL | Status: DC

## 2013-10-23 NOTE — Progress Notes (Signed)
Subjective:    Patient ID: [name], female    DOB: August 30, 1950, 63 y.o.   MRN: [id number]  HPI Patient here today for swollen lymph nodes, sore throat and bilateral ear pain.         Patient Active Problem List   Diagnosis Date Noted  . Hyperlipidemia   . Blood in urine 09/20/2012  . Hematuria 09/20/2012  . Lumbar radicular pain 09/20/2012  . Rash and nonspecific skin eruption 09/20/2012  . Shingles outbreak 09/20/2012  . Sciatica 06/12/2012  . GERD (gastroesophageal reflux disease) 06/12/2012  . Unspecified vitamin D deficiency 06/12/2012  . Elevated BP 06/12/2012  . Cervical dysplasia   . Osteopenia   . INFECTIOUS DIARRHEA 08/05/2009  . DIARRHEA-PRESUMED INFECTIOUS 08/05/2009  . HYPERLIPIDEMIA 08/05/2009  . DYSPHAGIA 02/20/2009  . G E R D 01/31/2007  . 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 BENIGN NEOPLASM [place] SITE DIGESTIVE SYSTEM 01/27/2006  . ESOPHAGEAL STRICTURE 01/27/2006  . HIATAL HERNIA 01/27/2006  . HEMORRHOIDS, EXTERNAL 09/28/2003   Outpatient Encounter Prescriptions as of 10/23/2013  Medication Sig  . ALPRAZolam (XANAX) 0.25 MG tablet Take 0.25 mg by mouth at bedtime as needed.    Marland Kitchen atorvastatin (LIPITOR) 20 MG tablet Take 1 tablet (20 mg total) by mouth daily at 6 PM.  . BIOTIN PO Take by mouth.  Marland Kitchen CALCIUM-MAGNESIUM PO Take by mouth.    . Cranberry 400 MG CAPS Take by mouth.  . Cyanocobalamin (VITAMIN B-12 PO) Take by mouth.    . lansoprazole (PREVACID) 30 MG capsule Take 1 capsule (30 mg total) by mouth daily.  Marland Kitchen loratadine (CLARITIN) 10 MG tablet Take 10 mg by mouth daily.    . Pyridoxine HCl (VITAMIN B6 PO) Take by mouth.      Review of Systems  Constitutional: Negative.  Negative for fever.  HENT: Positive for ear pain (left > right), facial swelling (and neck area ), sinus pressure, sneezing and sore throat.   Eyes: Negative.   Respiratory: Negative.  Negative for cough.   Cardiovascular: Negative.   Gastrointestinal:  Negative.   Endocrine: Negative.   Genitourinary: Negative.   Musculoskeletal: Negative.   Skin: Negative.   Allergic/Immunologic: Negative.   Neurological: Positive for headaches.  Hematological: Negative.   Psychiatric/Behavioral: Negative.        Objective:   Physical Exam  Nursing note and vitals reviewed. Constitutional: She is oriented to person, place, and time. She appears well-developed and well-nourished. No distress.  HENT:  Head: Normocephalic and atraumatic.  Right Ear: External ear normal.  Left Ear: External ear normal.  Mouth/Throat: Oropharynx is clear and moist.  Nasal congestion bilaterally and anterior cervical tenderness left greater than right. TMs are normal bilaterally.  Eyes: Conjunctivae and EOM are normal. Pupils are equal, round, and reactive to light. Right eye exhibits no discharge. Left eye exhibits no discharge. No scleral icterus.  Neck: Normal range of motion. Neck supple. No thyromegaly present.  Anterior cervical tenderness left greater than right  Cardiovascular: Normal rate, regular rhythm and normal heart sounds.   Pulmonary/Chest: Effort normal and breath sounds normal. No respiratory distress. She has no wheezes. She has no rales. She exhibits no tenderness.  Upper airway congestion with cough  Abdominal: Bowel sounds are normal. There is no guarding.  Musculoskeletal: Normal range of motion.  Lymphadenopathy:    She has no cervical adenopathy.  Neurological: She is alert and oriented to person, place, and time.  Skin: Skin is warm and dry. No rash  noted.  Psychiatric: She has a normal mood and affect. Her behavior is normal. Judgment and thought content normal.    BP 138/86  Pulse 80  Temp(Src) 97.4 F (36.3 C) (Oral)  Ht 4\' 11"  (1.499 m)  Wt 107 lb (48.535 kg)  BMI 21.60 kg/m2       Assessment & Plan:  1. Sore throat - POCT rapid strep A - Strep A culture, throat  2. Acute URI  3. Allergic rhinitis due to pollen  4.  Acute bronchitis, unspecified organism  Meds ordered this encounter  Medications  . Cranberry 400 MG CAPS    Sig: Take by mouth.  Marland Kitchen azithromycin (ZITHROMAX) 250 MG tablet    Sig: 2 pills stat then one daily for infection until complete    Dispense:  6 tablet    Refill:  0   Patient Instructions  Drink plenty of fluids Take Tylenol for aches pains and fever Take Mucinex maximum strength, blue and white in color, over-the-counter, one twice daily with a large glass of water. This will help the cough and keep congestion loose Use saline nose spray, ayr, frequently during the day as this will help keep head congestion loose. Take antibiotic as directed    Arrie Senate MD

## 2013-10-23 NOTE — Patient Instructions (Signed)
Drink plenty of fluids Take Tylenol for aches pains and fever Take Mucinex maximum strength, blue and white in color, over-the-counter, one twice daily with a large glass of water. This will help the cough and keep congestion loose Use saline nose spray, ayr, frequently during the day as this will help keep head congestion loose. Take antibiotic as directed

## 2013-10-25 LAB — STREP A CULTURE, THROAT: STREP A CULTURE: NEGATIVE

## 2013-11-04 ENCOUNTER — Encounter: Payer: Self-pay | Admitting: Family Medicine

## 2013-11-06 ENCOUNTER — Ambulatory Visit (INDEPENDENT_AMBULATORY_CARE_PROVIDER_SITE_OTHER): Payer: 59 | Admitting: Nurse Practitioner

## 2013-11-06 ENCOUNTER — Encounter: Payer: Self-pay | Admitting: Nurse Practitioner

## 2013-11-06 VITALS — BP 132/85 | HR 119 | Temp 98.2°F | Ht 59.0 in | Wt 106.6 lb

## 2013-11-06 DIAGNOSIS — J0101 Acute recurrent maxillary sinusitis: Secondary | ICD-10-CM

## 2013-11-06 MED ORDER — AMOXICILLIN 875 MG PO TABS: 875.0000 mg | ORAL_TABLET | Freq: Two times a day (BID) | ORAL | Status: DC

## 2013-11-06 NOTE — Progress Notes (Signed)
   Subjective:    Patient ID: [name], female    DOB: 11-20-1950, 63 y.o.   MRN: [id number]  HPI Patient in today c/o congestion and facial pressure- slight sore throat- started Monday and has gotten a little worse.    Review of Systems  Constitutional: Negative for fever and chills.  HENT: Positive for congestion, postnasal drip, rhinorrhea and sore throat.   Respiratory: Positive for cough (productive- greenish).   Cardiovascular: Negative.   Genitourinary: Negative.   Neurological: Negative.   Psychiatric/Behavioral: Negative.        Objective:   Physical Exam  Constitutional: She is oriented to person, place, and time. She appears well-developed and well-nourished. No distress.  HENT:  Right Ear: Hearing, tympanic membrane, external ear and ear canal normal.  Left Ear: Hearing, tympanic membrane, external ear and ear canal normal.  Nose: Mucosal edema and rhinorrhea present. Right sinus exhibits maxillary sinus tenderness. Right sinus exhibits no frontal sinus tenderness. Left sinus exhibits maxillary sinus tenderness. Left sinus exhibits no frontal sinus tenderness.  Mouth/Throat: Uvula is midline, oropharynx is clear and moist and mucous membranes are normal.  Eyes: Pupils are equal, round, and reactive to light.  Neck: Normal range of motion. Neck supple.  Cardiovascular: Normal rate, regular rhythm and normal heart sounds.   Pulmonary/Chest: Effort normal and breath sounds normal.  Abdominal: Soft. Bowel sounds are normal.  Lymphadenopathy:    She has no cervical adenopathy.  Neurological: She is alert and oriented to person, place, and time.  Skin: Skin is warm and dry.  Psychiatric: She has a normal mood and affect. Her behavior is normal. Judgment and thought content normal.    BP 132/85 mmHg  Pulse 119  Temp(Src) 98.2 F (36.8 C) (Oral)  Ht 4\' 11"  (1.499 m)  Wt 106 lb 9.6 oz (48.353 kg)  BMI 21.52 kg/m2       Assessment & Plan:   1. Acute recurrent  maxillary sinusitis    Meds ordered this encounter  Medications  . clotrimazole-betamethasone (LOTRISONE) cream    Sig:     Refill:  2  . amoxicillin (AMOXIL) 875 MG tablet    Sig: Take 1 tablet (875 mg total) by mouth 2 (two) times daily.    Dispense:  20 tablet    Refill:  0    Order Specific Question:  Supervising Provider    Answer:  Chipper Herb [[id number]]   1. Take meds as prescribed 2. Use a cool mist humidifier especially during the winter months and when heat has been humid. 3. Use saline nose sprays frequently 4. Saline irrigations of the nose can be very helpful if done frequently.  * 4X daily for 1 week*  * Use of a nettie pot can be helpful with this. Follow directions with this* 5. Drink plenty of fluids 6. Keep thermostat turn down low 7.For any cough or congestion  Use plain Mucinex- regular strength or max strength is fine   * Children- consult with Pharmacist for dosing 8. For fever or aces or pains- take tylenol or ibuprofen appropriate for age and weight.  * for fevers greater than 101 orally you may alternate ibuprofen and tylenol every  3 hours.   Mary-Margaret Hassell Done, FNP

## 2013-11-06 NOTE — Patient Instructions (Signed)

## 2013-12-30 ENCOUNTER — Ambulatory Visit: Payer: 59 | Admitting: Family Medicine

## 2014-01-08 ENCOUNTER — Ambulatory Visit (INDEPENDENT_AMBULATORY_CARE_PROVIDER_SITE_OTHER): Payer: 59 | Admitting: Nurse Practitioner

## 2014-01-08 ENCOUNTER — Encounter: Payer: Self-pay | Admitting: Nurse Practitioner

## 2014-01-08 VITALS — BP 134/80 | HR 83 | Temp 97.2°F | Ht 59.0 in | Wt 109.0 lb

## 2014-01-08 DIAGNOSIS — K219 Gastro-esophageal reflux disease without esophagitis: Secondary | ICD-10-CM

## 2014-01-08 DIAGNOSIS — E785 Hyperlipidemia, unspecified: Secondary | ICD-10-CM

## 2014-01-08 NOTE — Progress Notes (Signed)
   Subjective:    Patient ID: [name], female    DOB: 1950-08-14, 64 y.o.   MRN: [id number]   Patient here today for follow up of chronic medical problems.   Hyperlipidemia This is a chronic problem. The current episode started more than 1 year ago. The problem is controlled. Recent lipid tests were reviewed and are normal. She has no history of diabetes, hypothyroidism or obesity. Current antihyperlipidemic treatment includes statins. The current treatment provides moderate improvement of lipids. Compliance problems include adherence to diet and adherence to exercise.  Risk factors for coronary artery disease include dyslipidemia and post-menopausal.  GERD Prevacid OTC working well to keep under control    Review of Systems  Constitutional: Negative.   HENT: Negative.   Respiratory: Negative.   Cardiovascular: Negative.   Gastrointestinal: Negative.   Genitourinary: Negative.   Neurological: Negative.   Psychiatric/Behavioral: Negative.   All other systems reviewed and are negative.      Objective:   Physical Exam  Constitutional: She is oriented to person, place, and time. She appears well-developed and well-nourished.  HENT:  Nose: Nose normal.  Mouth/Throat: Oropharynx is clear and moist.  Eyes: EOM are normal.  Neck: Trachea normal, normal range of motion and full passive range of motion without pain. Neck supple. No JVD present. Carotid bruit is not present. No thyromegaly present.  Cardiovascular: Normal rate, regular rhythm, normal heart sounds and intact distal pulses.  Exam reveals no gallop and no friction rub.   No murmur heard. Pulmonary/Chest: Effort normal and breath sounds normal.  Abdominal: Soft. Bowel sounds are normal. She exhibits no distension and no mass. There is no tenderness.  Musculoskeletal: Normal range of motion.  Lymphadenopathy:    She has no cervical adenopathy.  Neurological: She is alert and oriented to person, place, and time. She has  normal reflexes.  Skin: Skin is warm and dry.  Psychiatric: She has a normal mood and affect. Her behavior is normal. Judgment and thought content normal.    BP 134/80 mmHg  Pulse 83  Temp(Src) 97.2 F (36.2 C) (Oral)  Ht $R'4\' 11"'sL$  (1.499 m)  Wt 109 lb (49.442 kg)  BMI 22.00 kg/m2       Assessment & Plan:  1. Gastroesophageal reflux disease without esophagitis Avoid spicy foods  2. Hyperlipidemia with target LDL less than 100 Watch fat in diet - CMP14+EGFR - NMR, lipoprofile   hemoccult cards given to patient with directions Labs pending Health maintenance reviewed Diet and exercise encouraged Continue all meds Follow up  In 6 month    [place], FNP

## 2014-01-08 NOTE — Patient Instructions (Signed)

## 2014-01-09 ENCOUNTER — Telehealth: Payer: Self-pay | Admitting: Nurse Practitioner

## 2014-01-09 LAB — CMP14+EGFR
ALT: 35 IU/L — ABNORMAL HIGH (ref 0–32)
AST: 28 IU/L (ref 0–40)
Albumin/Globulin Ratio: 2.2 (ref 1.1–2.5)
Albumin: 4.3 g/dL (ref 3.6–4.8)
Alkaline Phosphatase: 83 IU/L (ref 39–117)
BUN / CREAT RATIO: 18 (ref 11–26)
BUN: 12 mg/dL (ref 8–27)
CO2: 27 mmol/L (ref 18–29)
CREATININE: 0.65 mg/dL (ref 0.57–1.00)
Calcium: 9.6 mg/dL (ref 8.7–10.3)
Chloride: 100 mmol/L (ref 97–108)
GFR calc Af Amer: 109 mL/min/{1.73_m2} (ref >59)
GFR calc non Af Amer: 94 mL/min/{1.73_m2} (ref >59)
Globulin, Total: 2 g/dL (ref 1.5–4.5)
Glucose: 86 mg/dL (ref 65–99)
POTASSIUM: 3.6 mmol/L (ref 3.5–5.2)
SODIUM: 142 mmol/L (ref 134–144)
TOTAL PROTEIN: 6.3 g/dL (ref 6.0–8.5)
Total Bilirubin: <0.2 mg/dL (ref 0.0–1.2)

## 2014-01-09 LAB — NMR, LIPOPROFILE
Cholesterol: 163 mg/dL (ref 100–199)
HDL Cholesterol by NMR: 51 mg/dL (ref >39)
HDL Particle Number: 38.7 umol/L (ref >=30.5)
LDL Particle Number: 943 nmol/L (ref <1000)
LDL Size: 20.5 nm (ref >20.5)
LDL-C: 74 mg/dL (ref 0–99)
LP-IR SCORE: 56 — AB (ref <=45)
SMALL LDL PARTICLE NUMBER: 466 nmol/L (ref <=527)
TRIGLYCERIDES BY NMR: 189 mg/dL — AB (ref 0–149)

## 2014-01-09 NOTE — Telephone Encounter (Signed)
-----   Message from [facility] sent at 01/09/2014 11:25 AM EST ----- Kidney and liver function stable Cholesterol looks great Continue current meds- low fat diet and exercise and recheck in 3 months

## 2014-01-10 ENCOUNTER — Encounter: Payer: Self-pay | Admitting: Nurse Practitioner

## 2014-01-10 NOTE — Telephone Encounter (Signed)
Patient aware.

## 2014-01-13 ENCOUNTER — Other Ambulatory Visit: Payer: 59

## 2014-01-13 DIAGNOSIS — Z1212 Encounter for screening for malignant neoplasm of rectum: Secondary | ICD-10-CM

## 2014-01-13 NOTE — Progress Notes (Signed)
Lab only 

## 2014-01-15 LAB — FECAL OCCULT BLOOD, IMMUNOCHEMICAL: Fecal Occult Bld: NEGATIVE

## 2014-04-02 ENCOUNTER — Ambulatory Visit (INDEPENDENT_AMBULATORY_CARE_PROVIDER_SITE_OTHER): Payer: 59 | Admitting: Family Medicine

## 2014-04-02 ENCOUNTER — Encounter: Payer: Self-pay | Admitting: Family Medicine

## 2014-04-02 VITALS — BP 130/81 | HR 102 | Temp 97.9°F | Ht 59.0 in | Wt 109.0 lb

## 2014-04-02 DIAGNOSIS — J0101 Acute recurrent maxillary sinusitis: Secondary | ICD-10-CM | POA: Diagnosis not present

## 2014-04-02 MED ORDER — FEXOFENADINE-PSEUDOEPHED ER 180-240 MG PO TB24: 1.0000 | ORAL_TABLET | Freq: Every evening | ORAL | Status: DC

## 2014-04-02 MED ORDER — AMOXICILLIN-POT CLAVULANATE 875-125 MG PO TABS: 1.0000 | ORAL_TABLET | Freq: Two times a day (BID) | ORAL | Status: DC

## 2014-04-02 NOTE — Patient Instructions (Signed)
Take all of the antibiotic.

## 2014-04-02 NOTE — Progress Notes (Addendum)
Subjective:  Patient ID: [name], female    DOB: Jul 04, 1950  Age: 64 y.o. MRN: [id number]  CC: Sinusitis   HPI VALEEN BORYS presents for congested. Teeth hurt Symptoms include congestion, facial pain, nasal congestion, no  fever, non productive cough, post nasal drip and sinus pressure with no fever, chills, night sweats or weight loss. Onset of symptoms was a few days ago, gradually worsening since that time. Pt.is drinking moderate amounts of fluids.     History Merci has a past medical history of PUD (peptic ulcer disease); Allergic rhinitis; GERD (gastroesophageal reflux disease); Chronic constipation; Diverticulosis; Vitamin D deficiency; Cerumen impaction; Nasal congestion; Sinusitis; Palpitation; Chest discomfort; Osteopenia (09/2012); Scoliosis; Hyperlipidemia; and Kidney stones.   She has past surgical history that includes Laparoscopic cholecystectomy; Tubal ligation (1991); Cardiac catheterization; excision of mole (2007); Gynecologic cryosurgery; and Colposcopy.   Her family history includes Cancer in her father; Diabetes in her sister; Heart disease in her mother; Hyperlipidemia in her father, mother, and sister; Hypertension in her father, mother, and sister.She reports that she has never smoked. She does not have any smokeless tobacco history on file. She reports that she drinks alcohol. She reports that she does not use illicit drugs.  Current Outpatient Prescriptions on File Prior to Visit  Medication Sig Dispense Refill  . ALPRAZolam (XANAX) 0.25 MG tablet Take 0.25 mg by mouth at bedtime as needed.      Marland Kitchen atorvastatin (LIPITOR) 20 MG tablet Take 1 tablet (20 mg total) by mouth daily at 6 PM. 90 tablet 3  . BIOTIN PO Take by mouth.    Marland Kitchen CALCIUM-MAGNESIUM PO Take by mouth.      . Cranberry 400 MG CAPS Take by mouth.    . Cyanocobalamin (VITAMIN B-12 PO) Take by mouth.      . lansoprazole (PREVACID) 30 MG capsule Take 1 capsule (30 mg total) by mouth daily. 90 capsule  3  . Pyridoxine HCl (VITAMIN B6 PO) Take by mouth.       No current facility-administered medications on file prior to visit.    ROS Review of Systems  Constitutional: Negative for fever, chills, activity change and appetite change.  HENT: Positive for congestion, postnasal drip, rhinorrhea and sinus pressure. Negative for ear discharge, ear pain, hearing loss, nosebleeds, sneezing and trouble swallowing.   Respiratory: Negative for chest tightness and shortness of breath.   Cardiovascular: Negative for chest pain and palpitations.  Skin: Negative for rash.    Objective:  BP 130/81 mmHg  Pulse 102  Temp(Src) 97.9 F (36.6 C) (Oral)  Ht 4\' 11"  (1.499 m)  Wt 109 lb (49.442 kg)  BMI 22.00 kg/m2  BP Readings from Last 3 Encounters:  04/02/14 130/81  01/08/14 134/80  11/06/13 132/85    Wt Readings from Last 3 Encounters:  04/02/14 109 lb (49.442 kg)  01/08/14 109 lb (49.442 kg)  11/06/13 106 lb 9.6 oz (48.353 kg)     Physical Exam  Constitutional: She appears well-developed and well-nourished.  HENT:  Head: Normocephalic and atraumatic.  Right Ear: Tympanic membrane and external ear normal. No decreased hearing is noted.  Left Ear: Tympanic membrane and external ear normal. No decreased hearing is noted.  Nose: Mucosal edema present. Right sinus exhibits no frontal sinus tenderness. Left sinus exhibits no frontal sinus tenderness.  Mouth/Throat: No oropharyngeal exudate or posterior oropharyngeal erythema.  Neck: No Brudzinski's sign noted.  Pulmonary/Chest: Breath sounds normal. No respiratory distress.  Lymphadenopathy:  Head (right side): No preauricular adenopathy present.       Head (left side): No preauricular adenopathy present.       Right cervical: No superficial cervical adenopathy present.      Left cervical: No superficial cervical adenopathy present.    No results found for: HGBA1C  Lab Results  Component Value Date   WBC 9.0 09/14/2012   HGB  14.3 09/14/2012   HCT 42.2 09/14/2012   PLT 292.0 08/05/2009   GLUCOSE 86 01/08/2014   CHOL 163 01/08/2014   TRIG 189* 01/08/2014   HDL 51 01/08/2014   LDLCALC 73 06/18/2013   ALT 35* 01/08/2014   AST 28 01/08/2014   NA 142 01/08/2014   K 3.6 01/08/2014   CL 100 01/08/2014   CREATININE 0.65 01/08/2014   BUN 12 01/08/2014   CO2 27 01/08/2014    Mm Screening Breast Tomo Bilateral  08/05/2013   CLINICAL DATA:  Screening.  EXAM: DIGITAL SCREENING BILATERAL MAMMOGRAM WITH 3D TOMO WITH CAD  COMPARISON:  Previous exam(s).  ACR Breast Density Category c: The breast tissue is heterogeneously dense, which may obscure small masses.  FINDINGS: There are no findings suspicious for malignancy. Images were processed with CAD.  IMPRESSION: No mammographic evidence of malignancy. A result letter of this screening mammogram will be mailed directly to the patient.  RECOMMENDATION: Screening mammogram in one year. (Code:[id number]-01Y)  BI-RADS CATEGORY  1: Negative.   Electronically Signed   By: Lajean Manes M.D.   On: 08/05/2013 12:34    Assessment & Plan:   Amada was seen today for sinusitis.  Diagnoses and all orders for this visit:  Acute recurrent maxillary sinusitis  Other orders -     fexofenadine-pseudoephedrine (ALLEGRA-D ALLERGY & CONGESTION) 180-240 MG per 24 hr tablet; Take 1 tablet by mouth every evening. -     amoxicillin-clavulanate (AUGMENTIN) 875-125 MG per tablet; Take 1 tablet by mouth 2 (two) times daily.   I have discontinued Ms. Nanna's loratadine and clotrimazole-betamethasone. I am also having her start on fexofenadine-pseudoephedrine and amoxicillin-clavulanate. Additionally, I am having her maintain her ALPRAZolam, CALCIUM-MAGNESIUM PO, Pyridoxine HCl (VITAMIN B6 PO), Cyanocobalamin (VITAMIN B-12 PO), BIOTIN PO, atorvastatin, lansoprazole, and Cranberry.  Meds ordered this encounter  Medications  . fexofenadine-pseudoephedrine (ALLEGRA-D ALLERGY & CONGESTION) 180-240 MG per 24  hr tablet    Sig: Take 1 tablet by mouth every evening.    Dispense:  30 tablet    Refill:  5  . amoxicillin-clavulanate (AUGMENTIN) 875-125 MG per tablet    Sig: Take 1 tablet by mouth 2 (two) times daily.    Dispense:  20 tablet    Refill:  0     Follow-up: Return if symptoms worsen or fail to improve.  Claretta Fraise, M.D.

## 2014-07-08 ENCOUNTER — Other Ambulatory Visit: Payer: Self-pay

## 2014-07-08 DIAGNOSIS — K219 Gastro-esophageal reflux disease without esophagitis: Secondary | ICD-10-CM

## 2014-07-08 DIAGNOSIS — K2100 Gastro-esophageal reflux disease with esophagitis, without bleeding: Secondary | ICD-10-CM

## 2014-07-08 MED ORDER — LANSOPRAZOLE 30 MG PO CPDR: 30.0000 mg | DELAYED_RELEASE_CAPSULE | Freq: Every day | ORAL | Status: DC

## 2014-07-11 ENCOUNTER — Encounter (INDEPENDENT_AMBULATORY_CARE_PROVIDER_SITE_OTHER): Payer: Self-pay

## 2014-07-11 ENCOUNTER — Encounter: Payer: Self-pay | Admitting: Nurse Practitioner

## 2014-07-11 ENCOUNTER — Ambulatory Visit (INDEPENDENT_AMBULATORY_CARE_PROVIDER_SITE_OTHER): Payer: 59 | Admitting: Nurse Practitioner

## 2014-07-11 VITALS — BP 132/82 | HR 81 | Temp 97.4°F | Ht 59.0 in | Wt 109.0 lb

## 2014-07-11 DIAGNOSIS — M8580 Other specified disorders of bone density and structure, unspecified site: Secondary | ICD-10-CM

## 2014-07-11 DIAGNOSIS — Z23 Encounter for immunization: Secondary | ICD-10-CM

## 2014-07-11 DIAGNOSIS — E785 Hyperlipidemia, unspecified: Secondary | ICD-10-CM

## 2014-07-11 DIAGNOSIS — E559 Vitamin D deficiency, unspecified: Secondary | ICD-10-CM | POA: Diagnosis not present

## 2014-07-11 DIAGNOSIS — K219 Gastro-esophageal reflux disease without esophagitis: Secondary | ICD-10-CM | POA: Diagnosis not present

## 2014-07-11 MED ORDER — LANSOPRAZOLE 30 MG PO CPDR: 30.0000 mg | DELAYED_RELEASE_CAPSULE | Freq: Every day | ORAL | Status: DC

## 2014-07-11 MED ORDER — ATORVASTATIN CALCIUM 20 MG PO TABS: 20.0000 mg | ORAL_TABLET | Freq: Every day | ORAL | Status: DC

## 2014-07-11 NOTE — Progress Notes (Signed)
   Subjective:    Patient ID: [name], female    DOB: [birth year], 64 y.o.   MRN: [id number]   Patient here today for follow up of chronic medical problems.   Hyperlipidemia This is a chronic problem. The current episode started more than 1 year ago. The problem is controlled. Recent lipid tests were reviewed and are normal. She has no history of diabetes, hypothyroidism or obesity. Current antihyperlipidemic treatment includes statins. The current treatment provides moderate improvement of lipids. Compliance problems include adherence to diet and adherence to exercise.  Risk factors for coronary artery disease include dyslipidemia and post-menopausal.  GERD Prevacid OTC working well to keep under control Vitamin d def Currently not taking anything osteopenia Weight bearing exercises 2-3 x a week    Review of Systems  Constitutional: Negative.   HENT: Negative.   Respiratory: Negative.   Cardiovascular: Negative.   Gastrointestinal: Negative.   Genitourinary: Negative.   Neurological: Negative.   Psychiatric/Behavioral: Negative.   All other systems reviewed and are negative.      Objective:   Physical Exam  Constitutional: She is oriented to person, place, and time. She appears well-developed and well-nourished.  HENT:  Nose: Nose normal.  Mouth/Throat: Oropharynx is clear and moist.  Eyes: EOM are normal.  Neck: Trachea normal, normal range of motion and full passive range of motion without pain. Neck supple. No JVD present. Carotid bruit is not present. No thyromegaly present.  Cardiovascular: Normal rate, regular rhythm, normal heart sounds and intact distal pulses.  Exam reveals no gallop and no friction rub.   No murmur heard. Pulmonary/Chest: Effort normal and breath sounds normal.  Abdominal: Soft. Bowel sounds are normal. She exhibits no distension and no mass. There is no tenderness.  Musculoskeletal: Normal range of motion.  Lymphadenopathy:    She has no  cervical adenopathy.  Neurological: She is alert and oriented to person, place, and time. She has normal reflexes.  Skin: Skin is warm and dry.  Psychiatric: She has a normal mood and affect. Her behavior is normal. Judgment and thought content normal.    BP 132/82 mmHg  Pulse 81  Temp(Src) 97.4 F (36.3 C) (Oral)  Ht $R'4\' 11"'nX$  (1.499 m)  Wt 109 lb (49.442 kg)  BMI 22.00 kg/m2        Assessment & Plan:  1. Gastroesophageal reflux disease without esophagitis Avoid spicy foods Do not eat 2 hours prior to bedtime - lansoprazole (PREVACID) 30 MG capsule; Take 1 capsule (30 mg total) by mouth daily.  Dispense: 90 capsule; Refill: 0  2. Osteopenia Continue weight bearing exericses  3. Vitamin D deficiency - Vit D  25 hydroxy (rtn osteoporosis monitoring)  4. Hyperlipidemia with target LDL less than 100 Low fat diet - CMP14+EGFR - Lipid panel - atorvastatin (LIPITOR) 20 MG tablet; Take 1 tablet (20 mg total) by mouth daily at 6 PM.  Dispense: 90 tablet; Refill: 3   prevnar 13 today Labs pending Health maintenance reviewed Diet and exercise encouraged Continue all meds Follow up  In 6 months   [place], FNP

## 2014-07-11 NOTE — Addendum Note (Signed)
Addended by: Rolena Infante on: 07/11/2014 11:05 AM   Modules accepted: Orders

## 2014-07-11 NOTE — Patient Instructions (Signed)
Exercise to Stay Healthy Exercise helps you become and stay healthy. EXERCISE IDEAS AND TIPS Choose exercises that:  You enjoy.  Fit into your day. You do not need to exercise really hard to be healthy. You can do exercises at a slow or medium level and stay healthy. You can:  Stretch before and after working out.  Try yoga, Pilates, or tai chi.  Lift weights.  Walk fast, swim, jog, run, climb stairs, bicycle, dance, or rollerskate.  Take aerobic classes. Exercises that burn about 150 calories:  Running 1  miles in 15 minutes.  Playing volleyball for 45 to 60 minutes.  Washing and waxing a car for 45 to 60 minutes.  Playing touch football for 45 minutes.  Walking 1  miles in 35 minutes.  Pushing a stroller 1  miles in 30 minutes.  Playing basketball for 30 minutes.  Raking leaves for 30 minutes.  Bicycling 5 miles in 30 minutes.  Walking 2 miles in 30 minutes.  Dancing for 30 minutes.  Shoveling snow for 15 minutes.  Swimming laps for 20 minutes.  Walking up stairs for 15 minutes.  Bicycling 4 miles in 15 minutes.  Gardening for 30 to 45 minutes.  Jumping rope for 15 minutes.  Washing windows or floors for 45 to 60 minutes. Document Released: 01/22/2010 Document Revised: 03/14/2011 Document Reviewed: 01/22/2010 [facility] Patient Information 2015 [place], [place]. This information is not intended to replace advice given to you by your health care provider. Make sure you discuss any questions you have with your health care provider.

## 2014-07-12 LAB — VITAMIN D 25 HYDROXY (VIT D DEFICIENCY, FRACTURES): Vit D, 25-Hydroxy: 34.7 ng/mL (ref 30.0–100.0)

## 2014-07-12 LAB — LIPID PANEL
CHOLESTEROL TOTAL: 163 mg/dL (ref 100–199)
Chol/HDL Ratio: 2.9 ratio units (ref 0.0–4.4)
HDL: 57 mg/dL (ref >39)
LDL CALC: 79 mg/dL (ref 0–99)
Triglycerides: 136 mg/dL (ref 0–149)
VLDL CHOLESTEROL CAL: 27 mg/dL (ref 5–40)

## 2014-07-12 LAB — CMP14+EGFR
ALBUMIN: 4.6 g/dL (ref 3.6–4.8)
ALT: 26 IU/L (ref 0–32)
AST: 28 IU/L (ref 0–40)
Albumin/Globulin Ratio: 1.9 (ref 1.1–2.5)
Alkaline Phosphatase: 82 IU/L (ref 39–117)
BUN/Creatinine Ratio: 13 (ref 11–26)
BUN: 11 mg/dL (ref 8–27)
Bilirubin Total: 0.3 mg/dL (ref 0.0–1.2)
CALCIUM: 10.2 mg/dL (ref 8.7–10.3)
CO2: 27 mmol/L (ref 18–29)
CREATININE: 0.82 mg/dL (ref 0.57–1.00)
Chloride: 100 mmol/L (ref 97–108)
GFR calc Af Amer: 87 mL/min/{1.73_m2} (ref >59)
GFR, EST NON AFRICAN AMERICAN: 75 mL/min/{1.73_m2} (ref >59)
GLOBULIN, TOTAL: 2.4 g/dL (ref 1.5–4.5)
GLUCOSE: 94 mg/dL (ref 65–99)
Potassium: 4.4 mmol/L (ref 3.5–5.2)
Sodium: 142 mmol/L (ref 134–144)
Total Protein: 7 g/dL (ref 6.0–8.5)

## 2014-07-15 ENCOUNTER — Encounter: Payer: Self-pay | Admitting: *Deleted

## 2014-07-22 ENCOUNTER — Encounter: Payer: Self-pay | Admitting: Gastroenterology

## 2014-07-23 ENCOUNTER — Other Ambulatory Visit: Payer: Self-pay | Admitting: Gynecology

## 2014-07-23 DIAGNOSIS — Z1231 Encounter for screening mammogram for malignant neoplasm of breast: Secondary | ICD-10-CM

## 2014-08-26 ENCOUNTER — Other Ambulatory Visit: Payer: Self-pay | Admitting: Family Medicine

## 2014-09-11 ENCOUNTER — Ambulatory Visit (HOSPITAL_COMMUNITY)
Admission: RE | Admit: 2014-09-11 | Discharge: 2014-09-11 | Disposition: A | Discharge status: Home / Self Care | Payer: 59 | Source: Ambulatory Visit | Attending: Gynecology | Admitting: Gynecology

## 2014-09-11 ENCOUNTER — Encounter: Payer: 59 | Admitting: Gynecology

## 2014-09-11 DIAGNOSIS — Z1231 Encounter for screening mammogram for malignant neoplasm of breast: Secondary | ICD-10-CM

## 2014-10-06 ENCOUNTER — Other Ambulatory Visit: Payer: Self-pay | Admitting: Family Medicine

## 2014-10-27 ENCOUNTER — Ambulatory Visit (INDEPENDENT_AMBULATORY_CARE_PROVIDER_SITE_OTHER): Payer: 59 | Admitting: Pediatrics

## 2014-10-27 ENCOUNTER — Encounter: Payer: Self-pay | Admitting: Pediatrics

## 2014-10-27 VITALS — BP 148/90 | HR 99 | Temp 98.3°F | Ht 59.0 in | Wt 114.2 lb

## 2014-10-27 DIAGNOSIS — R05 Cough: Secondary | ICD-10-CM | POA: Diagnosis not present

## 2014-10-27 DIAGNOSIS — R059 Cough, unspecified: Secondary | ICD-10-CM

## 2014-10-27 DIAGNOSIS — J069 Acute upper respiratory infection, unspecified: Secondary | ICD-10-CM

## 2014-10-27 MED ORDER — HYDROCODONE-HOMATROPINE 5-1.5 MG/5ML PO SYRP
5.0000 mL | ORAL_SOLUTION | Freq: Four times a day (QID) | ORAL | Status: DC | PRN
Start: 2014-10-27 — End: 2014-11-07

## 2014-10-27 NOTE — Patient Instructions (Signed)
Netipot twice a day Continue allergy medicine daily Use flonase after the netipot Let me know if not getting better by next Monday

## 2014-10-27 NOTE — Progress Notes (Signed)
Subjective:    Patient ID: [name], female    DOB: May 24, 1950, 64 y.o.   MRN: [id number]  CC: URI symptoms  HPI: [name] is a 64 y.o. female presenting on 10/27/2014 for Sinus pressure  Takes allergy medicines regularly Two days ago started having more sneezing, coughing up green/yellow sputum from lungs No fevers Appetite been not great today Drinking plenty of fluids    Relevant past medical, surgical, family and social history reviewed and updated as indicated. Interim medical history since our last visit reviewed. Allergies and medications reviewed and updated.   ROS: Per HPI unless specifically indicated above  Past Medical History Patient Active Problem List   Diagnosis Date Noted  . Lumbar radicular pain 09/20/2012  . Shingles outbreak 09/20/2012  . Sciatica 06/12/2012  . GERD (gastroesophageal reflux disease) 06/12/2012  . Vitamin D deficiency 06/12/2012  . Osteopenia   . Hyperlipidemia with target LDL less than 100 08/05/2009  . 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 ESOPHAGEAL STRICTURE 01/27/2006  . HIATAL HERNIA 01/27/2006  . HEMORRHOIDS, EXTERNAL 09/28/2003    Current Outpatient Prescriptions  Medication Sig Dispense Refill  . ALPRAZolam (XANAX) 0.25 MG tablet Take 0.25 mg by mouth at bedtime as needed.      Marland Kitchen aspirin 81 MG chewable tablet Chew by mouth daily.    Marland Kitchen atorvastatin (LIPITOR) 20 MG tablet Take 1 tablet (20 mg total) by mouth daily at 6 PM. 90 tablet 3  . BIOTIN PO Take by mouth.    Marland Kitchen CALCIUM-MAGNESIUM PO Take by mouth.      . Cranberry 400 MG CAPS Take by mouth.    . Cyanocobalamin (VITAMIN B-12 PO) Take by mouth.      . fexofenadine-pseudoephedrine (ALLEGRA-D ALLERGY & CONGESTION) 180-240 MG per 24 hr tablet Take 1 tablet by mouth every evening. 30 tablet 5  . HYDROcodone-homatropine (HYCODAN) 5-1.5 MG/5ML syrup Take 5 mLs by mouth every 6 (six) hours as needed for cough. 120 mL 0  . lansoprazole (PREVACID) 30 MG capsule  Take 1 capsule (30 mg total) by mouth daily. 90 capsule 0  . lansoprazole (PREVACID) 30 MG capsule TAKE 1 CAPSULE (30 MG TOTAL) BY MOUTH DAILY. 90 capsule 0  . Pyridoxine HCl (VITAMIN B6 PO) Take by mouth.       No current facility-administered medications for this visit.       Objective:    BP 148/90 mmHg  Pulse 99  Temp(Src) 98.3 F (36.8 C) (Oral)  Ht 4\' 11"  (1.499 m)  Wt 114 lb 3.2 oz (51.801 kg)  BMI 23.05 kg/m2  Wt Readings from Last 3 Encounters:  10/27/14 114 lb 3.2 oz (51.801 kg)  07/11/14 109 lb (49.442 kg)  04/02/14 109 lb (49.442 kg)    Gen: NAD, alert, cooperative with exam, NCAT EYES: EOMI, no scleral injection or icterus ENT:  TMs pearly gray b/l, OP with mild erythema LYMPH: no cervical LAD CV: NRRR, normal S1/S2, no murmur, distal pulses 2+ b/l Resp: CTABL, no wheezes, normal WOB Abd: +BS, soft, NTND. no guarding or organomegaly Neuro: Alert and oriented     Assessment & Plan:    [name] was seen today for acute viral URI. Has allergies at baseline. Discussed symptomatic care, see pt instructions. RTC if not improving in 5-7 days.  Diagnoses and all orders for this visit:  Acute URI  Allergic rhinitis  Cough -     HYDROcodone-homatropine (HYCODAN) 5-1.5 MG/5ML syrup; Take 5 mLs by mouth every  6 (six) hours as needed for cough, use before bedtime.    Follow up plan: As needed  Assunta Found, MD [facility] 10/27/2014, 1:13 PM

## 2014-11-07 ENCOUNTER — Other Ambulatory Visit (HOSPITAL_COMMUNITY)
Admission: RE | Admit: 2014-11-07 | Discharge: 2014-11-07 | Disposition: A | Discharge status: Home / Self Care | Payer: 59 | Source: Ambulatory Visit | Attending: Gynecology | Admitting: Gynecology

## 2014-11-07 ENCOUNTER — Ambulatory Visit (INDEPENDENT_AMBULATORY_CARE_PROVIDER_SITE_OTHER): Payer: 59 | Admitting: Gynecology

## 2014-11-07 ENCOUNTER — Encounter: Payer: Self-pay | Admitting: Gynecology

## 2014-11-07 VITALS — BP 124/84 | Ht 59.0 in | Wt 114.0 lb

## 2014-11-07 DIAGNOSIS — M8580 Other specified disorders of bone density and structure, unspecified site: Secondary | ICD-10-CM

## 2014-11-07 DIAGNOSIS — Z124 Encounter for screening for malignant neoplasm of cervix: Secondary | ICD-10-CM | POA: Diagnosis not present

## 2014-11-07 DIAGNOSIS — Z01419 Encounter for gynecological examination (general) (routine) without abnormal findings: Secondary | ICD-10-CM

## 2014-11-07 DIAGNOSIS — N952 Postmenopausal atrophic vaginitis: Secondary | ICD-10-CM | POA: Diagnosis not present

## 2014-11-07 NOTE — Patient Instructions (Signed)

## 2014-11-07 NOTE — Addendum Note (Signed)
Addended by: Nelva Nay on: 11/07/2014 10:18 AM   Modules accepted: Orders

## 2014-11-07 NOTE — Progress Notes (Signed)
[name] 1950/05/22 [id number]        64 y.o.  G1P1001  No LMP recorded. Patient is postmenopausal. for annual exam.  Several issues noted below.  Past medical history,surgical history, problem list, medications, allergies, family history and social history were all reviewed and documented as reviewed in the EPIC chart.  ROS:  Performed with pertinent positives and negatives included in the history, assessment and plan.   Additional significant findings :  none   Exam: Kim Counsellor Vitals:   11/07/14 0950  BP: 124/84  Height: 4\' 11"  (1.499 m)  Weight: 114 lb (51.71 kg)   General appearance:  Normal affect, orientation and appearance. Skin: Grossly normal HEENT: Without gross lesions.  No cervical or supraclavicular adenopathy. Thyroid normal.  Lungs:  Clear without wheezing, rales or rhonchi Cardiac: RR, without RMG Abdominal:  Soft, nontender, without masses, guarding, rebound, organomegaly or hernia Breasts:  Examined lying and sitting without masses, retractions, discharge or axillary adenopathy. Pelvic:  Ext/BUS/vagina with atrophic changes  Cervix with atrophic changes. Pap smear done  Uterus anteverted, normal size, shape and contour, midline and mobile nontender   Adnexa  Without masses or tenderness    Anus and perineum  Normal   Rectovaginal  Normal sphincter tone without palpated masses or tenderness.    Assessment/Plan:  64 y.o. G33P1001 female for annual exam.   1. Postmenopausal/atrophic genital changes. Without significant hot flushes, night sweats, vaginal dryness or any vaginal bleeding. Continue to monitor and report any issues or bleeding. 2. Osteopenia.  DEXA 2014 T score -1.9. FRAX 9%/1.3%. Recommended repeat DEXA now. Patient prefers to wait until she is on Medicare which apparently will then pay for this as her regular insurance will not. Issues of progression and treatment reviewed. Patients can await next year to repeat her DEXA in her  choice. 3. Pap smear 2013. Pap smear done today. History of cryosurgery done years ago with normal Pap smears since then. 4. Colonoscopy 2011. Repeat at their recommended interval. 5. Mammography 09/2014. Continue with annual mammography. SBE monthly reviewed. 6. Health maintenance. No routine lab work done as patient reports this done at her primary physician's office. Follow up 1 year, sooner as needed.   Anastasio Auerbach MD, 10:14 AM 11/07/2014

## 2014-11-11 LAB — CYTOLOGY - PAP

## 2014-11-25 ENCOUNTER — Telehealth: Payer: Self-pay | Admitting: Family Medicine

## 2015-01-05 ENCOUNTER — Other Ambulatory Visit: Payer: Self-pay | Admitting: Nurse Practitioner

## 2015-01-13 ENCOUNTER — Ambulatory Visit: Payer: 59 | Admitting: Nurse Practitioner

## 2015-01-20 ENCOUNTER — Ambulatory Visit (INDEPENDENT_AMBULATORY_CARE_PROVIDER_SITE_OTHER): Payer: 59

## 2015-01-20 ENCOUNTER — Ambulatory Visit (INDEPENDENT_AMBULATORY_CARE_PROVIDER_SITE_OTHER): Payer: 59 | Admitting: Nurse Practitioner

## 2015-01-20 ENCOUNTER — Encounter: Payer: Self-pay | Admitting: Nurse Practitioner

## 2015-01-20 VITALS — BP 136/84 | HR 80 | Temp 97.0°F | Ht 59.0 in | Wt 114.0 lb

## 2015-01-20 DIAGNOSIS — K219 Gastro-esophageal reflux disease without esophagitis: Secondary | ICD-10-CM | POA: Diagnosis not present

## 2015-01-20 DIAGNOSIS — E785 Hyperlipidemia, unspecified: Secondary | ICD-10-CM

## 2015-01-20 DIAGNOSIS — Z1159 Encounter for screening for other viral diseases: Secondary | ICD-10-CM | POA: Diagnosis not present

## 2015-01-20 DIAGNOSIS — Z1212 Encounter for screening for malignant neoplasm of rectum: Secondary | ICD-10-CM | POA: Diagnosis not present

## 2015-01-20 DIAGNOSIS — E559 Vitamin D deficiency, unspecified: Secondary | ICD-10-CM

## 2015-01-20 NOTE — Patient Instructions (Signed)
Fat and Cholesterol Restricted Diet High levels of fat and cholesterol in your blood may lead to various health problems, such as diseases of the heart, blood vessels, gallbladder, liver, and pancreas. Fats are concentrated sources of energy that come in various forms. Certain types of fat, including saturated fat, may be harmful in excess. Cholesterol is a substance needed by your body in small amounts. Your body makes all the cholesterol it needs. Excess cholesterol comes from the food you eat. When you have high levels of cholesterol and saturated fat in your blood, health problems can develop because the excess fat and cholesterol will gather along the walls of your blood vessels, causing them to narrow. Choosing the right foods will help you control your intake of fat and cholesterol. This will help keep the levels of these substances in your blood within normal limits and reduce your risk of disease. WHAT IS MY PLAN? Your health care provider recommends that you:  Get no more than ______30____ % of the total calories in your daily diet from fat.  Limit your intake of saturated fat to less than __20____% of your total calories each day.  Limit the amount of cholesterol in your diet to less than ______80___mg per day. WHAT TYPES OF FAT SHOULD I CHOOSE?  Choose healthy fats more often. Choose monounsaturated and polyunsaturated fats, such as olive and canola oil, flaxseeds, walnuts, almonds, and seeds.  Eat more omega-3 fats. Good choices include salmon, mackerel, sardines, tuna, flaxseed oil, and ground flaxseeds. Aim to eat fish at least two times a week.  Limit saturated fats. Saturated fats are primarily found in animal products, such as meats, butter, and cream. Plant sources of saturated fats include palm oil, palm kernel oil, and coconut oil.  Avoid foods with partially hydrogenated oils in them. These contain trans fats. Examples of foods that contain trans fats are stick margarine,  some tub margarines, cookies, crackers, and other baked goods. WHAT GENERAL GUIDELINES DO I NEED TO FOLLOW? These guidelines for healthy eating will help you control your intake of fat and cholesterol:  Check food labels carefully to identify foods with trans fats or high amounts of saturated fat.  Fill one half of your plate with vegetables and green salads.  Fill one fourth of your plate with whole grains. Look for the word "whole" as the first word in the ingredient list.  Fill one fourth of your plate with lean protein foods.  Limit fruit to two servings a day. Choose fruit instead of juice.  Eat more foods that contain soluble fiber. Examples of foods that contain this type of fiber are apples, broccoli, carrots, beans, peas, and barley. Aim to get 20-30 g of fiber per day.  Eat more home-cooked food and less restaurant, buffet, and fast food.  Limit or avoid alcohol.  Limit foods high in starch and sugar.  Limit fried foods.  Cook foods using methods other than frying. Baking, boiling, grilling, and broiling are all great options.  Lose weight if you are overweight. Losing just 5-10% of your initial body weight can help your overall health and prevent diseases such as diabetes and heart disease. WHAT FOODS CAN I EAT? Grains Whole grains, such as whole wheat or whole grain breads, crackers, cereals, and pasta. Unsweetened oatmeal, bulgur, barley, quinoa, or brown rice. Corn or whole wheat flour tortillas. Vegetables Fresh or frozen vegetables (raw, steamed, roasted, or grilled). Green salads. Fruits All fresh, canned (in natural juice), or frozen fruits. Meat and  Other Protein Products Ground beef (85% or leaner), grass-fed beef, or beef trimmed of fat. Skinless chicken or turkey. Ground chicken or turkey. Pork trimmed of fat. All fish and seafood. Eggs. Dried beans, peas, or lentils. Unsalted nuts or seeds. Unsalted canned or dry beans. Dairy Low-fat dairy products, such as  skim or 1% milk, 2% or reduced-fat cheeses, low-fat ricotta or cottage cheese, or plain low-fat yogurt. Fats and Oils Tub margarines without trans fats. Light or reduced-fat mayonnaise and salad dressings. Avocado. Olive, canola, sesame, or safflower oils. Natural peanut or almond butter (choose ones without added sugar and oil). The items listed above may not be a complete list of recommended foods or beverages. Contact your dietitian for more options. WHAT FOODS ARE NOT RECOMMENDED? Grains White bread. White pasta. White rice. Cornbread. Bagels, pastries, and croissants. Crackers that contain trans fat. Vegetables White potatoes. Corn. Creamed or fried vegetables. Vegetables in a cheese sauce. Fruits Dried fruits. Canned fruit in light or heavy syrup. Fruit juice. Meat and Other Protein Products Fatty cuts of meat. Ribs, chicken wings, bacon, sausage, bologna, salami, chitterlings, fatback, hot dogs, bratwurst, and packaged luncheon meats. Liver and organ meats. Dairy Whole or 2% milk, cream, half-and-half, and cream cheese. Whole milk cheeses. Whole-fat or sweetened yogurt. Full-fat cheeses. Nondairy creamers and whipped toppings. Processed cheese, cheese spreads, or cheese curds. Sweets and Desserts Corn syrup, sugars, honey, and molasses. Candy. Jam and jelly. Syrup. Sweetened cereals. Cookies, pies, cakes, donuts, muffins, and ice cream. Fats and Oils Butter, stick margarine, lard, shortening, ghee, or bacon fat. Coconut, palm kernel, or palm oils. Beverages Alcohol. Sweetened drinks (such as sodas, lemonade, and fruit drinks or punches). The items listed above may not be a complete list of foods and beverages to avoid. Contact your dietitian for more information.   This information is not intended to replace advice given to you by your health care provider. Make sure you discuss any questions you have with your health care provider.   Document Released: 12/20/2004 Document Revised:  01/10/2014 Document Reviewed: 03/20/2013 [employer] Interactive Patient Education 2016 [employer]  

## 2015-01-20 NOTE — Progress Notes (Signed)
Subjective:    Patient ID: [name], female    DOB: [birth year], 65 y.o.   MRN: [id number]   Patient here today for follow up of chronic medical problems.  Outpatient Encounter Prescriptions as of 01/20/2015  Medication Sig  . ALPRAZolam (XANAX) 0.25 MG tablet Take 0.25 mg by mouth at bedtime as needed.    Marland Kitchen aspirin 81 MG chewable tablet Chew by mouth daily.  Marland Kitchen atorvastatin (LIPITOR) 20 MG tablet Take 1 tablet (20 mg total) by mouth daily at 6 PM.  . BIOTIN PO Take by mouth.  Marland Kitchen CALCIUM-MAGNESIUM PO Take by mouth.    . Cranberry 400 MG CAPS Take by mouth.  . Cyanocobalamin (VITAMIN B-12 PO) Take by mouth.    . fexofenadine-pseudoephedrine (ALLEGRA-D ALLERGY & CONGESTION) 180-240 MG per 24 hr tablet Take 1 tablet by mouth every evening.  . lansoprazole (PREVACID) 30 MG capsule TAKE 1 CAPSULE (30 MG TOTAL) BY MOUTH DAILY.  Marland Kitchen Pyridoxine HCl (VITAMIN B6 PO) Take by mouth.     No facility-administered encounter medications on file as of 01/20/2015.     Hyperlipidemia This is a chronic problem. The current episode started more than 1 year ago. The problem is controlled. Recent lipid tests were reviewed and are normal. She has no history of diabetes, hypothyroidism or obesity. Current antihyperlipidemic treatment includes statins. The current treatment provides moderate improvement of lipids. Compliance problems include adherence to diet and adherence to exercise.  Risk factors for coronary artery disease include dyslipidemia and post-menopausal.  GERD Prevacid OTC working well to keep under control Vitamin d def Currently not taking anything osteopenia Weight bearing exercises 2-3 x a week    Review of Systems  Constitutional: Negative.   HENT: Negative.   Respiratory: Negative.   Cardiovascular: Negative.   Gastrointestinal: Negative.   Genitourinary: Negative.   Neurological: Negative.   Psychiatric/Behavioral: Negative.   All other systems reviewed and are negative.        Objective:   Physical Exam  Constitutional: She is oriented to person, place, and time. She appears well-developed and well-nourished.  HENT:  Nose: Nose normal.  Mouth/Throat: Oropharynx is clear and moist.  Eyes: EOM are normal.  Neck: Trachea normal, normal range of motion and full passive range of motion without pain. Neck supple. No JVD present. Carotid bruit is not present. No thyromegaly present.  Cardiovascular: Normal rate, regular rhythm, normal heart sounds and intact distal pulses.  Exam reveals no gallop and no friction rub.   No murmur heard. Pulmonary/Chest: Effort normal and breath sounds normal.  Abdominal: Soft. Bowel sounds are normal. She exhibits no distension and no mass. There is no tenderness.  Musculoskeletal: Normal range of motion.  Lymphadenopathy:    She has no cervical adenopathy.  Neurological: She is alert and oriented to person, place, and time. She has normal reflexes.  Skin: Skin is warm and dry.  Psychiatric: She has a normal mood and affect. Her behavior is normal. Judgment and thought content normal.   BP 136/84 mmHg  Pulse 80  Temp(Src) 97 F (36.1 C) (Oral)  Ht 4\' 11"  (1.499 m)  Wt 114 lb (51.71 kg)  BMI 23.01 kg/m2  Chest x ray- no active cardiopulmonary disease-Preliminary reading by Ronnald Collum, FNP  [facility]   EKG- Kerry Hough, FNP           Assessment & Plan:  1. Hyperlipidemia with target LDL less than 100 Low fat diet - DG Chest 2 View; Future - EKG  12-Lead - Lipid panel  2. Vitamin D deficiency Continue OTC vitamin d  3. Gastroesophageal reflux disease without esophagitis Avoid spicy foods Do not eat 2 hours prior to bedtime  4. Screening for malignant neoplasm of the rectum - Fecal occult blood, imunochemical; Future  5. Need for hepatitis C screening test - Hepatitis C antibody    Labs pending Health maintenance reviewed Diet and exercise encouraged Continue all meds Follow up  In 6 months    [place], FNP

## 2015-01-21 LAB — LIPID PANEL
CHOLESTEROL TOTAL: 154 mg/dL (ref 100–199)
Chol/HDL Ratio: 2.9 ratio units (ref 0.0–4.4)
HDL: 53 mg/dL (ref >39)
LDL CALC: 75 mg/dL (ref 0–99)
TRIGLYCERIDES: 128 mg/dL (ref 0–149)
VLDL CHOLESTEROL CAL: 26 mg/dL (ref 5–40)

## 2015-01-21 LAB — HEPATITIS C ANTIBODY: Hep C Virus Ab: <0.1 s/co ratio (ref 0.0–0.9)

## 2015-01-26 ENCOUNTER — Other Ambulatory Visit: Payer: 59

## 2015-01-26 DIAGNOSIS — Z1212 Encounter for screening for malignant neoplasm of rectum: Secondary | ICD-10-CM

## 2015-01-27 LAB — FECAL OCCULT BLOOD, IMMUNOCHEMICAL: Fecal Occult Bld: NEGATIVE

## 2015-01-29 NOTE — Progress Notes (Signed)
Patient aware.

## 2015-03-05 ENCOUNTER — Telehealth: Payer: Self-pay | Admitting: Family Medicine

## 2015-03-05 MED ORDER — OSELTAMIVIR PHOSPHATE 75 MG PO CAPS: 75.0000 mg | ORAL_CAPSULE | Freq: Two times a day (BID) | ORAL | Status: DC

## 2015-03-05 NOTE — Telephone Encounter (Signed)
tamiflu I sent the Rx to the pharmacy.

## 2015-04-12 ENCOUNTER — Other Ambulatory Visit: Payer: Self-pay | Admitting: Family Medicine

## 2015-07-01 ENCOUNTER — Ambulatory Visit (INDEPENDENT_AMBULATORY_CARE_PROVIDER_SITE_OTHER): Payer: 59 | Admitting: Physician Assistant

## 2015-07-01 ENCOUNTER — Encounter: Payer: Self-pay | Admitting: Physician Assistant

## 2015-07-01 VITALS — BP 157/104 | HR 95 | Temp 100.6°F | Ht 59.0 in | Wt 113.2 lb

## 2015-07-01 DIAGNOSIS — J0100 Acute maxillary sinusitis, unspecified: Secondary | ICD-10-CM | POA: Diagnosis not present

## 2015-07-01 MED ORDER — AMOXICILLIN-POT CLAVULANATE 875-125 MG PO TABS: 1.0000 | ORAL_TABLET | Freq: Two times a day (BID) | ORAL | Status: DC

## 2015-07-01 NOTE — Patient Instructions (Signed)

## 2015-07-01 NOTE — Progress Notes (Signed)
Subjective:     Patient ID: [name], female   DOB: Feb 13, 1950, 65 y.o.   MRN: [id number]  HPI Pt with facial pressure, fever and S/T for 4 days  Review of Systems  Constitutional: Positive for fever, appetite change and fatigue.  HENT: Positive for congestion, postnasal drip, sinus pressure and sore throat. Negative for ear discharge, ear pain, nosebleeds, rhinorrhea and sneezing.   Respiratory: Positive for cough. Negative for shortness of breath and wheezing.   Cardiovascular: Negative.        Objective:   Physical Exam  Constitutional: She appears well-developed and well-nourished.  HENT:  Right Ear: External ear normal.  Left Ear: External ear normal.  Mouth/Throat: Oropharynx is clear and moist. No oropharyngeal exudate.  + maxillary sinus TTP  Neck: Neck supple.  Cardiovascular: Normal rate, regular rhythm and normal heart sounds.   Pulmonary/Chest: Effort normal and breath sounds normal. No respiratory distress. She has no wheezes. She has no rales. She exhibits no tenderness.  Lymphadenopathy:    She has no cervical adenopathy.  Nursing note and vitals reviewed.      Assessment:     1. Acute maxillary sinusitis, recurrence not specified        Plan:     Fluids Rest Augmentin 875mg  bid x 10 days OTC meds for sx Prev BP reading good so would just like for her to get occasional readings F/U prn

## 2015-07-08 ENCOUNTER — Other Ambulatory Visit: Payer: Self-pay | Admitting: *Deleted

## 2015-07-08 MED ORDER — LANSOPRAZOLE 30 MG PO CPDR: DELAYED_RELEASE_CAPSULE | ORAL | Status: DC

## 2015-07-21 ENCOUNTER — Encounter: Payer: Self-pay | Admitting: Nurse Practitioner

## 2015-07-21 ENCOUNTER — Ambulatory Visit (INDEPENDENT_AMBULATORY_CARE_PROVIDER_SITE_OTHER): Payer: 59 | Admitting: Nurse Practitioner

## 2015-07-21 VITALS — BP 142/84 | HR 71 | Temp 97.1°F | Ht 59.0 in | Wt 113.0 lb

## 2015-07-21 DIAGNOSIS — M8580 Other specified disorders of bone density and structure, unspecified site: Secondary | ICD-10-CM

## 2015-07-21 DIAGNOSIS — K449 Diaphragmatic hernia without obstruction or gangrene: Secondary | ICD-10-CM | POA: Diagnosis not present

## 2015-07-21 DIAGNOSIS — E785 Hyperlipidemia, unspecified: Secondary | ICD-10-CM | POA: Diagnosis not present

## 2015-07-21 DIAGNOSIS — E559 Vitamin D deficiency, unspecified: Secondary | ICD-10-CM | POA: Diagnosis not present

## 2015-07-21 DIAGNOSIS — K219 Gastro-esophageal reflux disease without esophagitis: Secondary | ICD-10-CM | POA: Diagnosis not present

## 2015-07-21 DIAGNOSIS — Z23 Encounter for immunization: Secondary | ICD-10-CM

## 2015-07-21 LAB — CMP14+EGFR
ALBUMIN: 4.2 g/dL (ref 3.6–4.8)
ALT: 27 IU/L (ref 0–32)
AST: 30 IU/L (ref 0–40)
Albumin/Globulin Ratio: 1.8 (ref 1.2–2.2)
Alkaline Phosphatase: 80 IU/L (ref 39–117)
BILIRUBIN TOTAL: 0.3 mg/dL (ref 0.0–1.2)
BUN / CREAT RATIO: 22 (ref 12–28)
BUN: 15 mg/dL (ref 8–27)
CHLORIDE: 101 mmol/L (ref 96–106)
CO2: 24 mmol/L (ref 18–29)
CREATININE: 0.69 mg/dL (ref 0.57–1.00)
Calcium: 9.9 mg/dL (ref 8.7–10.3)
GFR calc non Af Amer: 91 mL/min/{1.73_m2} (ref >59)
GFR, EST AFRICAN AMERICAN: 105 mL/min/{1.73_m2} (ref >59)
GLUCOSE: 94 mg/dL (ref 65–99)
Globulin, Total: 2.4 g/dL (ref 1.5–4.5)
Potassium: 4 mmol/L (ref 3.5–5.2)
Sodium: 143 mmol/L (ref 134–144)
TOTAL PROTEIN: 6.6 g/dL (ref 6.0–8.5)

## 2015-07-21 LAB — LIPID PANEL
CHOLESTEROL TOTAL: 152 mg/dL (ref 100–199)
Chol/HDL Ratio: 2.7 ratio units (ref 0.0–4.4)
HDL: 56 mg/dL (ref >39)
LDL CALC: 76 mg/dL (ref 0–99)
Triglycerides: 102 mg/dL (ref 0–149)
VLDL CHOLESTEROL CAL: 20 mg/dL (ref 5–40)

## 2015-07-21 MED ORDER — LANSOPRAZOLE 30 MG PO CPDR: DELAYED_RELEASE_CAPSULE | ORAL | Status: DC

## 2015-07-21 MED ORDER — ATORVASTATIN CALCIUM 20 MG PO TABS: 20.0000 mg | ORAL_TABLET | Freq: Every day | ORAL | Status: DC

## 2015-07-21 NOTE — Progress Notes (Signed)
Subjective:    Patient ID: [name], female    DOB: [birth year], 65 y.o.   MRN: [id number]   Patient here today for follow up of chronic medical problems.  Outpatient Encounter Prescriptions as of 07/21/2015  Medication Sig  . ALPRAZolam (XANAX) 0.25 MG tablet Take 0.25 mg by mouth at bedtime as needed.    Marland Kitchen aspirin 81 MG chewable tablet Chew by mouth daily.  Marland Kitchen atorvastatin (LIPITOR) 20 MG tablet Take 1 tablet (20 mg total) by mouth daily at 6 PM.  . BIOTIN PO Take by mouth.  Marland Kitchen CALCIUM-MAGNESIUM PO Take by mouth.    . Cranberry 400 MG CAPS Take by mouth.  . Cyanocobalamin (VITAMIN B-12 PO) Take by mouth.    . lansoprazole (PREVACID) 30 MG capsule TAKE 1 CAPSULE (30 MG TOTAL) BY MOUTH DAILY.  Marland Kitchen loratadine-pseudoephedrine (CLARITIN-D 24-HOUR) 10-240 MG 24 hr tablet Take 1 tablet by mouth daily.  . Pyridoxine HCl (VITAMIN B6 PO) Take by mouth.     Hyperlipidemia This is a chronic problem. The current episode started more than 1 year ago. The problem is controlled. Recent lipid tests were reviewed and are normal. She has no history of diabetes, hypothyroidism or obesity. Current antihyperlipidemic treatment includes statins. The current treatment provides moderate improvement of lipids. Compliance problems include adherence to diet and adherence to exercise.  Risk factors for coronary artery disease include dyslipidemia and post-menopausal.  GERD/hiatal hernia Prevacid OTC working well to keep under control Vitamin d def Currently not taking anything osteopenia Weight bearing exercises 2-3 x a week IBS with constipation Mainly controlled with diet and stool softners  Review of Systems  Constitutional: Negative.   HENT: Negative.   Respiratory: Negative.   Cardiovascular: Negative.   Gastrointestinal: Negative.   Genitourinary: Negative.   Neurological: Negative.   Psychiatric/Behavioral: Negative.   All other systems reviewed and are negative.      Objective:   Physical  Exam  Constitutional: She is oriented to person, place, and time. She appears well-developed and well-nourished.  HENT:  Nose: Nose normal.  Mouth/Throat: Oropharynx is clear and moist.  Eyes: EOM are normal.  Neck: Trachea normal, normal range of motion and full passive range of motion without pain. Neck supple. No JVD present. Carotid bruit is not present. No thyromegaly present.  Cardiovascular: Normal rate, regular rhythm, normal heart sounds and intact distal pulses.  Exam reveals no gallop and no friction rub.   No murmur heard. Pulmonary/Chest: Effort normal and breath sounds normal.  Abdominal: Soft. Bowel sounds are normal. She exhibits no distension and no mass. There is no tenderness.  Musculoskeletal: Normal range of motion.  Lymphadenopathy:    She has no cervical adenopathy.  Neurological: She is alert and oriented to person, place, and time. She has normal reflexes.  Skin: Skin is warm and dry.  Psychiatric: She has a normal mood and affect. Her behavior is normal. Judgment and thought content normal.    BP 142/84 mmHg  Pulse 71  Temp(Src) 97.1 F (36.2 C) (Oral)  Ht _0  (1.499 m)  Wt 113 lb (51.256 kg)  BMI 22.81 kg/m2      Assessment & Plan:   1. 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 Gastroesophageal reflux disease without esophagitis Avoid spicy foods Do not eat 2 hours prior to bedtime - lansoprazole (PREVACID) 30 MG capsule; TAKE 1 CAPSULE (30 MG TOTAL) BY MOUTH DAILY.  Dispense: 90 capsule; Refill: 0  3.  Hyperlipidemia with target LDL less than 100 Low fat diet - atorvastatin (LIPITOR) 20 MG tablet; Take 1 tablet (20 mg total) by mouth daily at 6 PM.  Dispense: 90 tablet; Refill: 3 - CMP14+EGFR - Lipid panel  4. Vitamin D deficiency Daily vitamin d suplements  5. Osteopenia Weight bearing exercises    Labs pending Health maintenance reviewed Diet and exercise encouraged Continue all  meds Follow up  In 6 month   [place], FNP

## 2015-07-21 NOTE — Addendum Note (Signed)
Addended by: Rolena Infante on: 07/21/2015 09:24 AM   Modules accepted: Orders

## 2015-07-21 NOTE — Patient Instructions (Signed)
Health Maintenance, Female Adopting a healthy lifestyle and getting preventive care can go a long way to promote health and wellness. Talk with your health care provider about what schedule of regular examinations is right for you. This is a good chance for you to check in with your provider about disease prevention and staying healthy. In between checkups, there are plenty of things you can do on your own. Experts have done a lot of research about which lifestyle changes and preventive measures are most likely to keep you healthy. Ask your health care provider for more information. WEIGHT AND DIET  Eat a healthy diet  Be sure to include plenty of vegetables, fruits, low-fat dairy products, and lean protein.  Do not eat a lot of foods high in solid fats, added sugars, or salt.  Get regular exercise. This is one of the most important things you can do for your health.  Most adults should exercise for at least 150 minutes each week. The exercise should increase your heart rate and make you sweat (moderate-intensity exercise).  Most adults should also do strengthening exercises at least twice a week. This is in addition to the moderate-intensity exercise.  Maintain a healthy weight  Body mass index (BMI) is a measurement that can be used to identify possible weight problems. It estimates body fat based on height and weight. Your health care provider can help determine your BMI and help you achieve or maintain a healthy weight.  For females 20 years of age and older:   A BMI below 18.5 is considered underweight.  A BMI of 18.5 to 24.9 is normal.  A BMI of 25 to 29.9 is considered overweight.  A BMI of 30 and above is considered obese.  Watch levels of cholesterol and blood lipids  You should start having your blood tested for lipids and cholesterol at 66 years of age, then have this test every 5 years.  You may need to have your cholesterol levels checked more often if:  Your lipid  or cholesterol levels are high.  You are older than 66 years of age.  You are at high risk for heart disease.  CANCER SCREENING   Lung Cancer  Lung cancer screening is recommended for adults 55-80 years old who are at high risk for lung cancer because of a history of smoking.  A yearly low-dose CT scan of the lungs is recommended for people who:  Currently smoke.  Have quit within the past 15 years.  Have at least a 30-pack-year history of smoking. A pack year is smoking an average of one pack of cigarettes a day for 1 year.  Yearly screening should continue until it has been 15 years since you quit.  Yearly screening should stop if you develop a health problem that would prevent you from having lung cancer treatment.  Breast Cancer  Practice breast self-awareness. This means understanding how your breasts normally appear and feel.  It also means doing regular breast self-exams. Let your health care provider know about any changes, no matter how small.  If you are in your 20s or 30s, you should have a clinical breast exam (CBE) by a health care provider every 1-3 years as part of a regular health exam.  If you are 40 or older, have a CBE every year. Also consider having a breast X-ray (mammogram) every year.  If you have a family history of breast cancer, talk to your health care provider about genetic screening.  If you   are at high risk for breast cancer, talk to your health care provider about having an MRI and a mammogram every year.  Breast cancer gene (BRCA) assessment is recommended for women who have family members with BRCA-related cancers. BRCA-related cancers include:  Breast.  Ovarian.  Tubal.  Peritoneal cancers.  Results of the assessment will determine the need for genetic counseling and BRCA1 and BRCA2 testing. Cervical Cancer Your health care provider may recommend that you be screened regularly for cancer of the pelvic organs (ovaries, uterus, and  vagina). This screening involves a pelvic examination, including checking for microscopic changes to the surface of your cervix (Pap test). You may be encouraged to have this screening done every 3 years, beginning at age 21.  For women ages 30-65, health care providers may recommend pelvic exams and Pap testing every 3 years, or they may recommend the Pap and pelvic exam, combined with testing for human papilloma virus (HPV), every 5 years. Some types of HPV increase your risk of cervical cancer. Testing for HPV may also be done on women of any age with unclear Pap test results.  Other health care providers may not recommend any screening for nonpregnant women who are considered low risk for pelvic cancer and who do not have symptoms. Ask your health care provider if a screening pelvic exam is right for you.  If you have had past treatment for cervical cancer or a condition that could lead to cancer, you need Pap tests and screening for cancer for at least 20 years after your treatment. If Pap tests have been discontinued, your risk factors (such as having a new sexual partner) need to be reassessed to determine if screening should resume. Some women have medical problems that increase the chance of getting cervical cancer. In these cases, your health care provider may recommend more frequent screening and Pap tests. Colorectal Cancer  This type of cancer can be detected and often prevented.  Routine colorectal cancer screening usually begins at 66 years of age and continues through 66 years of age.  Your health care provider may recommend screening at an earlier age if you have risk factors for colon cancer.  Your health care provider may also recommend using home test kits to check for hidden blood in the stool.  A small camera at the end of a tube can be used to examine your colon directly (sigmoidoscopy or colonoscopy). This is done to check for the earliest forms of colorectal  cancer.  Routine screening usually begins at age 50.  Direct examination of the colon should be repeated every 5-10 years through 66 years of age. However, you may need to be screened more often if early forms of precancerous polyps or small growths are found. Skin Cancer  Check your skin from head to toe regularly.  Tell your health care provider about any new moles or changes in moles, especially if there is a change in a mole's shape or color.  Also tell your health care provider if you have a mole that is larger than the size of a pencil eraser.  Always use sunscreen. Apply sunscreen liberally and repeatedly throughout the day.  Protect yourself by wearing long sleeves, pants, a wide-brimmed hat, and sunglasses whenever you are outside. HEART DISEASE, DIABETES, AND HIGH BLOOD PRESSURE   High blood pressure causes heart disease and increases the risk of stroke. High blood pressure is more likely to develop in:  People who have blood pressure in the high end   of the normal range (130-139/85-89 mm Hg).  People who are overweight or obese.  People who are African American.  If you are 38-23 years of age, have your blood pressure checked every 3-5 years. If you are 61 years of age or older, have your blood pressure checked every year. You should have your blood pressure measured twice--once when you are at a hospital or clinic, and once when you are not at a hospital or clinic. Record the average of the two measurements. To check your blood pressure when you are not at a hospital or clinic, you can use:  An automated blood pressure machine at a pharmacy.  A home blood pressure monitor.  If you are between 45 years and 39 years old, ask your health care provider if you should take aspirin to prevent strokes.  Have regular diabetes screenings. This involves taking a blood sample to check your fasting blood sugar level.  If you are at a normal weight and have a low risk for diabetes,  have this test once every three years after 66 years of age.  If you are overweight and have a high risk for diabetes, consider being tested at a younger age or more often. PREVENTING INFECTION  Hepatitis B  If you have a higher risk for hepatitis B, you should be screened for this virus. You are considered at high risk for hepatitis B if:  You were born in a country where hepatitis B is common. Ask your health care provider which countries are considered high risk.  Your parents were born in a high-risk country, and you have not been immunized against hepatitis B (hepatitis B vaccine).  You have HIV or AIDS.  You use needles to inject street drugs.  You live with someone who has hepatitis B.  You have had sex with someone who has hepatitis B.  You get hemodialysis treatment.  You take certain medicines for conditions, including cancer, organ transplantation, and autoimmune conditions. Hepatitis C  Blood testing is recommended for:  Everyone born from [birth year] through 1966.  Anyone with known risk factors for hepatitis C. Sexually transmitted infections (STIs)  You should be screened for sexually transmitted infections (STIs) including gonorrhea and chlamydia if:  You are sexually active and are younger than 66 years of age.  You are older than 66 years of age and your health care provider tells you that you are at risk for this type of infection.  Your sexual activity has changed since you were last screened and you are at an increased risk for chlamydia or gonorrhea. Ask your health care provider if you are at risk.  If you do not have HIV, but are at risk, it may be recommended that you take a prescription medicine daily to prevent HIV infection. This is called pre-exposure prophylaxis (PrEP). You are considered at risk if:  You are sexually active and do not regularly use condoms or know the HIV status of your partner(s).  You take drugs by injection.  You are sexually  active with a partner who has HIV. Talk with your health care provider about whether you are at high risk of being infected with HIV. If you choose to begin PrEP, you should first be tested for HIV. You should then be tested every 3 months for as long as you are taking PrEP.  PREGNANCY   If you are premenopausal and you may become pregnant, ask your health care provider about preconception counseling.  If you may  become pregnant, take 400 to 800 micrograms (mcg) of folic acid every day.  If you want to prevent pregnancy, talk to your health care provider about birth control (contraception). OSTEOPOROSIS AND MENOPAUSE   Osteoporosis is a disease in which the bones lose minerals and strength with aging. This can result in serious bone fractures. Your risk for osteoporosis can be identified using a bone density scan.  If you are 61 years of age or older, or if you are at risk for osteoporosis and fractures, ask your health care provider if you should be screened.  Ask your health care provider whether you should take a calcium or vitamin D supplement to lower your risk for osteoporosis.  Menopause may have certain physical symptoms and risks.  Hormone replacement therapy may reduce some of these symptoms and risks. Talk to your health care provider about whether hormone replacement therapy is right for you.  HOME CARE INSTRUCTIONS   Schedule regular health, dental, and eye exams.  Stay current with your immunizations.   Do not use any tobacco products including cigarettes, chewing tobacco, or electronic cigarettes.  If you are pregnant, do not drink alcohol.  If you are breastfeeding, limit how much and how often you drink alcohol.  Limit alcohol intake to no more than 1 drink per day for nonpregnant women. One drink equals 12 ounces of beer, 5 ounces of wine, or 1 ounces of hard liquor.  Do not use street drugs.  Do not share needles.  Ask your health care provider for help if  you need support or information about quitting drugs.  Tell your health care provider if you often feel depressed.  Tell your health care provider if you have ever been abused or do not feel safe at home.   This information is not intended to replace advice given to you by your health care provider. Make sure you discuss any questions you have with your health care provider.   Document Released: 07/05/2010 Document Revised: 01/10/2014 Document Reviewed: 11/21/2012 Elsevier Interactive Patient Education [employer].

## 2015-07-22 ENCOUNTER — Telehealth: Payer: Self-pay | Admitting: Family Medicine

## 2015-07-23 NOTE — Telephone Encounter (Signed)
Reviewed test results

## 2015-08-27 ENCOUNTER — Other Ambulatory Visit: Payer: Self-pay | Admitting: Gynecology

## 2015-08-27 DIAGNOSIS — Z1231 Encounter for screening mammogram for malignant neoplasm of breast: Secondary | ICD-10-CM

## 2015-09-16 DIAGNOSIS — H2513 Age-related nuclear cataract, bilateral: Secondary | ICD-10-CM | POA: Diagnosis not present

## 2015-09-16 DIAGNOSIS — Z83511 Family history of glaucoma: Secondary | ICD-10-CM | POA: Diagnosis not present

## 2015-09-16 DIAGNOSIS — H40013 Open angle with borderline findings, low risk, bilateral: Secondary | ICD-10-CM | POA: Diagnosis not present

## 2015-10-07 DIAGNOSIS — L728 Other follicular cysts of the skin and subcutaneous tissue: Secondary | ICD-10-CM | POA: Diagnosis not present

## 2015-10-07 DIAGNOSIS — D239 Other benign neoplasm of skin, unspecified: Secondary | ICD-10-CM | POA: Diagnosis not present

## 2015-10-14 ENCOUNTER — Ambulatory Visit (INDEPENDENT_AMBULATORY_CARE_PROVIDER_SITE_OTHER): Payer: PPO

## 2015-10-14 DIAGNOSIS — Z23 Encounter for immunization: Secondary | ICD-10-CM | POA: Diagnosis not present

## 2015-11-09 ENCOUNTER — Encounter: Payer: Self-pay | Admitting: Gynecology

## 2015-11-09 ENCOUNTER — Ambulatory Visit
Admission: RE | Admit: 2015-11-09 | Discharge: 2015-11-09 | Disposition: A | Discharge status: Home / Self Care | Payer: PPO | Source: Ambulatory Visit | Attending: Gynecology | Admitting: Gynecology

## 2015-11-09 ENCOUNTER — Ambulatory Visit (INDEPENDENT_AMBULATORY_CARE_PROVIDER_SITE_OTHER): Payer: PPO | Admitting: Gynecology

## 2015-11-09 VITALS — BP 120/84 | Ht 59.0 in | Wt 111.0 lb

## 2015-11-09 DIAGNOSIS — Z01419 Encounter for gynecological examination (general) (routine) without abnormal findings: Secondary | ICD-10-CM

## 2015-11-09 DIAGNOSIS — N952 Postmenopausal atrophic vaginitis: Secondary | ICD-10-CM | POA: Diagnosis not present

## 2015-11-09 DIAGNOSIS — M8580 Other specified disorders of bone density and structure, unspecified site: Secondary | ICD-10-CM

## 2015-11-09 DIAGNOSIS — F411 Generalized anxiety disorder: Secondary | ICD-10-CM | POA: Diagnosis not present

## 2015-11-09 DIAGNOSIS — Z1231 Encounter for screening mammogram for malignant neoplasm of breast: Secondary | ICD-10-CM | POA: Diagnosis not present

## 2015-11-09 MED ORDER — ALPRAZOLAM 0.25 MG PO TABS: 0.2500 mg | ORAL_TABLET | Freq: Every evening | ORAL | 2 refills | Status: DC | PRN

## 2015-11-09 NOTE — Progress Notes (Signed)
    [name] 1950-06-14 [id number]        65 y.o.  G1P1001  for breast and pelvic exam.  Past medical history,surgical history, problem list, medications, allergies, family history and social history were all reviewed and documented as reviewed in the EPIC chart.  ROS:  Performed with pertinent positives and negatives included in the history, assessment and plan.   Additional significant findings :  None   Exam: [name] assistant Vitals:   11/09/15 1031  BP: 120/84  Weight: 111 lb (50.3 kg)  Height: 4\' 11"  (1.499 m)   Body mass index is 22.42 kg/m.  General appearance:  Normal affect, orientation and appearance. Skin: Grossly normal HEENT: Without gross lesions.  No cervical or supraclavicular adenopathy. Thyroid normal.  Lungs:  Clear without wheezing, rales or rhonchi Cardiac: RR, without RMG Abdominal:  Soft, nontender, without masses, guarding, rebound, organomegaly or hernia Breasts:  Examined lying and sitting without masses, retractions, discharge or axillary adenopathy. Pelvic:  Ext, BUS, Vagina with atrophic changes  Cervix with atrophic changes  Uterus anteverted, normal size, shape and contour, midline and mobile nontender   Adnexa without masses or tenderness    Anus and perineum normal   Rectovaginal normal sphincter tone without palpated masses or tenderness.    Assessment/Plan:  65 y.o. G17P1001 female for breast and pelvic exam.   1. Postmenopausal/atrophic genital changes. No significant hot flushes, night sweats, vaginal dryness or any vaginal bleeding. Continue to monitor any issues or bleeding. 2. Osteopenia. DEXA 2014 T score -1.9 FRAX 9%/1.3%. Increased fracture risk associated with diagnosis discussed. Schedule DEXA now at 3 year interval patient agrees to follow up for this. 3. Anxiety.  Patient recently retired and now helping to care for her grandchildren. Having some stress/anxiety. Had used Xanax in the past per Dr. Cherylann Banas asked if I  could feel this for her. Does not appear to be a primary disorder but more situational related.  Does not feel that she is going to use a large amount but would like to have a supply just in case. Has used before without significant side effects. Xanax 0.25 mg #30 with 2 refills provided. 4. Pap smear 2016. No Pap smear done today. History of cryosurgery a number of years ago with normal Pap smears since then. 5. Colonoscopy 2011. Repeat at their recommended interval. 6. Mammography 11/2015. Continue with annual mammography when due. SBE monthly reviewed. 7. Health maintenance. No routine lab work done as patient reports this done elsewhere. Follow up 1 year, sooner as needed.  Additional time in excess of her breast and pelvic exam was spent in direct face to face counseling and coordination of care in regards to her osteopenia, increased fracture risk and need to repeat her bone density as well as her anxiety with education provided.Anastasio Auerbach MD, 10:54 AM 11/09/2015

## 2015-12-10 ENCOUNTER — Other Ambulatory Visit: Payer: Self-pay | Admitting: Gynecology

## 2015-12-10 ENCOUNTER — Encounter: Payer: Self-pay | Admitting: Gynecology

## 2015-12-10 ENCOUNTER — Ambulatory Visit (INDEPENDENT_AMBULATORY_CARE_PROVIDER_SITE_OTHER): Payer: PPO

## 2015-12-10 DIAGNOSIS — M899 Disorder of bone, unspecified: Secondary | ICD-10-CM

## 2015-12-10 DIAGNOSIS — M8580 Other specified disorders of bone density and structure, unspecified site: Secondary | ICD-10-CM

## 2015-12-10 DIAGNOSIS — M8589 Other specified disorders of bone density and structure, multiple sites: Secondary | ICD-10-CM

## 2016-01-02 ENCOUNTER — Other Ambulatory Visit: Payer: Self-pay | Admitting: Nurse Practitioner

## 2016-01-02 DIAGNOSIS — K219 Gastro-esophageal reflux disease without esophagitis: Secondary | ICD-10-CM

## 2016-01-11 DIAGNOSIS — H2513 Age-related nuclear cataract, bilateral: Secondary | ICD-10-CM | POA: Diagnosis not present

## 2016-01-11 DIAGNOSIS — H40013 Open angle with borderline findings, low risk, bilateral: Secondary | ICD-10-CM | POA: Diagnosis not present

## 2016-01-11 DIAGNOSIS — Z83511 Family history of glaucoma: Secondary | ICD-10-CM | POA: Diagnosis not present

## 2016-01-22 ENCOUNTER — Ambulatory Visit: Payer: 59 | Admitting: Nurse Practitioner

## 2016-01-22 ENCOUNTER — Encounter: Payer: Self-pay | Admitting: Nurse Practitioner

## 2016-01-22 ENCOUNTER — Ambulatory Visit (INDEPENDENT_AMBULATORY_CARE_PROVIDER_SITE_OTHER): Payer: PPO | Admitting: Nurse Practitioner

## 2016-01-22 VITALS — BP 138/85 | HR 84 | Temp 98.1°F | Ht 59.0 in | Wt 113.0 lb

## 2016-01-22 DIAGNOSIS — M8580 Other specified disorders of bone density and structure, unspecified site: Secondary | ICD-10-CM | POA: Diagnosis not present

## 2016-01-22 DIAGNOSIS — E559 Vitamin D deficiency, unspecified: Secondary | ICD-10-CM | POA: Diagnosis not present

## 2016-01-22 DIAGNOSIS — E785 Hyperlipidemia, unspecified: Secondary | ICD-10-CM | POA: Diagnosis not present

## 2016-01-22 DIAGNOSIS — K219 Gastro-esophageal reflux disease without esophagitis: Secondary | ICD-10-CM | POA: Diagnosis not present

## 2016-01-22 MED ORDER — LANSOPRAZOLE 30 MG PO CPDR: DELAYED_RELEASE_CAPSULE | ORAL | 1 refills | Status: DC

## 2016-01-22 NOTE — Progress Notes (Signed)
Subjective:    Patient ID: [name], female    DOB: 19-Jul-1950, 66 y.o.   MRN: [id number]   Patient here today for follow up of chronic medical problems. Patient doing well has not been seen in a year for follow up. She has no complaints today.  Outpatient Encounter Prescriptions as of 01/22/2016  Medication Sig  . ALPRAZolam (XANAX) 0.25 MG tablet Take 1 tablet (0.25 mg total) by mouth at bedtime as needed.  Marland Kitchen aspirin 81 MG chewable tablet Chew by mouth daily.  Marland Kitchen atorvastatin (LIPITOR) 20 MG tablet Take 1 tablet (20 mg total) by mouth daily at 6 PM.  . BIOTIN PO Take by mouth.  Marland Kitchen CALCIUM-MAGNESIUM PO Take by mouth.    . Cranberry 400 MG CAPS Take by mouth.  . Cyanocobalamin (VITAMIN B-12 PO) Take by mouth.    . lansoprazole (PREVACID) 30 MG capsule TAKE 1 CAPSULE (30 MG TOTAL) BY MOUTH DAILY.  Marland Kitchen loratadine-pseudoephedrine (CLARITIN-D 24-HOUR) 10-240 MG 24 hr tablet Take 1 tablet by mouth daily.  Marland Kitchen LUMIGAN 0.01 % SOLN   . Pyridoxine HCl (VITAMIN B6 PO) Take by mouth.     No facility-administered encounter medications on file as of 01/22/2016.    Hyperlipidemia  This is a chronic problem. The current episode started more than 1 year ago. The problem is controlled. Recent lipid tests were reviewed and are normal. She has no history of diabetes, hypothyroidism or obesity. Current antihyperlipidemic treatment includes statins. The current treatment provides moderate improvement of lipids. Compliance problems include adherence to diet and adherence to exercise.  Risk factors for coronary artery disease include dyslipidemia and post-menopausal.  GERD/hiatal hernia Prevacid OTC working well to keep under control Vitamin d def Currently not taking anything osteopenia Weight bearing exercises 2-3 x a week IBS with constipation Patient takes miralax as needed, which helps. Started a probiotic and that has helped as well.  Review of Systems  Constitutional: Negative.   HENT: Negative.     Respiratory: Negative.   Cardiovascular: Negative.   Gastrointestinal: Negative.   Genitourinary: Negative.   Neurological: Negative.   Psychiatric/Behavioral: Negative.   All other systems reviewed and are negative.      Objective:   Physical Exam  Constitutional: She is oriented to person, place, and time. She appears well-developed and well-nourished.  HENT:  Nose: Nose normal.  Mouth/Throat: Oropharynx is clear and moist.  Eyes: EOM are normal.  Neck: Trachea normal, normal range of motion and full passive range of motion without pain. Neck supple. No JVD present. Carotid bruit is not present. No thyromegaly present.  Cardiovascular: Normal rate, regular rhythm, normal heart sounds and intact distal pulses.  Exam reveals no gallop and no friction rub.   No murmur heard. Pulmonary/Chest: Effort normal and breath sounds normal.  Abdominal: Soft. Bowel sounds are normal. She exhibits no distension and no mass. There is no tenderness.  Musculoskeletal: Normal range of motion.  Lymphadenopathy:    She has no cervical adenopathy.  Neurological: She is alert and oriented to person, place, and time. She has normal reflexes.  Skin: Skin is warm and dry.  Psychiatric: She has a normal mood and affect. Her behavior is normal. Judgment and thought content normal.   BP 138/85   Pulse 84   Temp 98.1 F (36.7 C) (Oral)   Ht 4' 11"  (1.499 m)   Wt 113 lb (51.3 kg)   BMI 22.82 kg/m       Assessment & Plan:  1. Osteopenia, unspecified location Weight bearing exercises  2. Hyperlipidemia with target LDL less than 100 Low fat diet - CMP14+EGFR - Lipid panel  3. Vitamin D deficiency  4. Gastroesophageal reflux disease without esophagitis Avoid spicy foods Do not eat 2 hours prior to bedtime - lansoprazole (PREVACID) 30 MG capsule; TAKE 1 CAPSULE (30 MG TOTAL) BY MOUTH DAILY.  Dispense: 90 capsule; Refill: 1    Labs pending Health maintenance reviewed Diet and exercise  encouraged Continue all meds Follow up  In 1 year   [place], FNP

## 2016-01-22 NOTE — Patient Instructions (Signed)

## 2016-01-23 LAB — CMP14+EGFR
A/G RATIO: 1.8 (ref 1.2–2.2)
ALT: 28 IU/L (ref 0–32)
AST: 27 IU/L (ref 0–40)
Albumin: 4.2 g/dL (ref 3.6–4.8)
Alkaline Phosphatase: 76 IU/L (ref 39–117)
BILIRUBIN TOTAL: 0.4 mg/dL (ref 0.0–1.2)
BUN/Creatinine Ratio: 19 (ref 12–28)
BUN: 12 mg/dL (ref 8–27)
CALCIUM: 9.6 mg/dL (ref 8.7–10.3)
CHLORIDE: 103 mmol/L (ref 96–106)
CO2: 24 mmol/L (ref 18–29)
Creatinine, Ser: 0.62 mg/dL (ref 0.57–1.00)
GFR calc Af Amer: 109 mL/min/{1.73_m2} (ref >59)
GFR, EST NON AFRICAN AMERICAN: 94 mL/min/{1.73_m2} (ref >59)
GLOBULIN, TOTAL: 2.4 g/dL (ref 1.5–4.5)
Glucose: 89 mg/dL (ref 65–99)
POTASSIUM: 4.2 mmol/L (ref 3.5–5.2)
SODIUM: 142 mmol/L (ref 134–144)
Total Protein: 6.6 g/dL (ref 6.0–8.5)

## 2016-01-23 LAB — LIPID PANEL
CHOL/HDL RATIO: 3.1 ratio (ref 0.0–4.4)
Cholesterol, Total: 154 mg/dL (ref 100–199)
HDL: 49 mg/dL (ref >39)
LDL Calculated: 81 mg/dL (ref 0–99)
Triglycerides: 119 mg/dL (ref 0–149)
VLDL Cholesterol Cal: 24 mg/dL (ref 5–40)

## 2016-02-29 ENCOUNTER — Ambulatory Visit (INDEPENDENT_AMBULATORY_CARE_PROVIDER_SITE_OTHER): Payer: PPO | Admitting: Pediatrics

## 2016-02-29 ENCOUNTER — Encounter: Payer: Self-pay | Admitting: Pediatrics

## 2016-02-29 VITALS — BP 154/91 | HR 113 | Temp 100.5°F | Ht 59.0 in | Wt 114.2 lb

## 2016-02-29 DIAGNOSIS — R6889 Other general symptoms and signs: Secondary | ICD-10-CM | POA: Diagnosis not present

## 2016-02-29 DIAGNOSIS — J111 Influenza due to unidentified influenza virus with other respiratory manifestations: Secondary | ICD-10-CM | POA: Diagnosis not present

## 2016-02-29 LAB — VERITOR FLU A/B WAIVED
INFLUENZA A: NEGATIVE
INFLUENZA B: NEGATIVE

## 2016-02-29 MED ORDER — OSELTAMIVIR PHOSPHATE 75 MG PO CAPS: 75.0000 mg | ORAL_CAPSULE | Freq: Two times a day (BID) | ORAL | 0 refills | Status: AC

## 2016-02-29 NOTE — Patient Instructions (Signed)
Try flonase for congestion Can also take cetirizine or other antihistamine

## 2016-02-29 NOTE — Progress Notes (Signed)
  Subjective:   Patient ID: [name], female    DOB: 11-Mar-1950, 66 y.o.   MRN: [id number] CC: Sore Throat and Cough  HPI: [name] is a 66 y.o. female presenting for Sore Throat and Cough  Last week dizzy for about three days Then got better Last night and this morning started having sore throat, dry irritating cough, burning Some chills starting this morning Taking tylenol at home  Relevant past medical, surgical, family and social history reviewed. Allergies and medications reviewed and updated. History  Smoking Status  . Never Smoker  Smokeless Tobacco  . Never Used   ROS: Per HPI   Objective:    BP (!) 154/91   Pulse (!) 113   Temp (!) 100.5 F (38.1 C) (Oral)   Ht 4\' 11"  (1.499 m)   Wt 114 lb 3.2 oz (51.8 kg)   BMI 23.07 kg/m   Wt Readings from Last 3 Encounters:  02/29/16 114 lb 3.2 oz (51.8 kg)  01/22/16 113 lb (51.3 kg)  11/09/15 111 lb (50.3 kg)    Gen: NAD, alert, cooperative with exam, NCAT EYES: EOMI, no conjunctival injection, or no icterus ENT:  L TM pearly gray, R TM obscured by cerumen, OP without erythema LYMPH: no cervical LAD CV: NRRR, normal S1/S2, no murmur, distal pulses 2+ b/l Resp: CTABL, no wheezes, normal WOB  Abd: +BS, soft, NTND. no guarding or organomegaly Ext: No edema, warm Neuro: Alert and oriented  Assessment & Plan:  [name] was seen today for sore throat and cough.  Diagnoses and all orders for this visit:  Influenza -     oseltamivir (TAMIFLU) 75 MG capsule; Take 1 capsule (75 mg total) by mouth 2 (two) times daily.  Flu-like symptoms -     Veritor Flu A/B Waived  Follow up plan: Return if symptoms worsen or fail to improve. Assunta Found, MD [place]

## 2016-03-11 DIAGNOSIS — H2513 Age-related nuclear cataract, bilateral: Secondary | ICD-10-CM | POA: Diagnosis not present

## 2016-03-11 DIAGNOSIS — Z83511 Family history of glaucoma: Secondary | ICD-10-CM | POA: Diagnosis not present

## 2016-03-11 DIAGNOSIS — H40013 Open angle with borderline findings, low risk, bilateral: Secondary | ICD-10-CM | POA: Diagnosis not present

## 2016-03-11 DIAGNOSIS — H40053 Ocular hypertension, bilateral: Secondary | ICD-10-CM | POA: Diagnosis not present

## 2016-07-07 ENCOUNTER — Ambulatory Visit (INDEPENDENT_AMBULATORY_CARE_PROVIDER_SITE_OTHER): Payer: PPO | Admitting: Nurse Practitioner

## 2016-07-07 ENCOUNTER — Encounter: Payer: Self-pay | Admitting: Nurse Practitioner

## 2016-07-07 VITALS — BP 146/87 | HR 85 | Temp 97.4°F | Ht 59.0 in | Wt 112.0 lb

## 2016-07-07 DIAGNOSIS — J0100 Acute maxillary sinusitis, unspecified: Secondary | ICD-10-CM

## 2016-07-07 DIAGNOSIS — H6123 Impacted cerumen, bilateral: Secondary | ICD-10-CM | POA: Diagnosis not present

## 2016-07-07 MED ORDER — DOXYCYCLINE HYCLATE 100 MG PO TABS: 100.0000 mg | ORAL_TABLET | Freq: Two times a day (BID) | ORAL | 0 refills | Status: DC

## 2016-07-07 NOTE — Patient Instructions (Signed)
Earwax Buildup, Adult The ears produce a substance called earwax that helps keep bacteria out of the ear and protects the skin in the ear canal. Occasionally, earwax can build up in the ear and cause discomfort or hearing loss. What increases the risk? This condition is more likely to develop in people who:  Are female.  Are elderly.  Naturally produce more earwax.  Clean their ears often with cotton swabs.  Use earplugs often.  Use in-ear headphones often.  Wear hearing aids.  Have narrow ear canals.  Have earwax that is overly thick or sticky.  Have eczema.  Are dehydrated.  Have excess hair in the ear canal.  What are the signs or symptoms? Symptoms of this condition include:  Reduced or muffled hearing.  A feeling of fullness in the ear or feeling that the ear is plugged.  Fluid coming from the ear.  Ear pain.  Ear itch.  Ringing in the ear.  Coughing.  An obvious piece of earwax that can be seen inside the ear canal.  How is this diagnosed? This condition may be diagnosed based on:  Your symptoms.  Your medical history.  An ear exam. During the exam, your health care provider will look into your ear with an instrument called an otoscope.  You may have tests, including a hearing test. How is this treated? This condition may be treated by:  Using ear drops to soften the earwax.  Having the earwax removed by a health care provider. The health care provider may: ? Flush the ear with water. ? Use an instrument that has a loop on the end (curette). ? Use a suction device.  Surgery to remove the wax buildup. This may be done in severe cases.  Follow these instructions at home:  Take over-the-counter and prescription medicines only as told by your health care provider.  Do not put any objects, including cotton swabs, into your ear. You can clean the opening of your ear canal with a washcloth or facial tissue.  Follow instructions from your health  care provider about cleaning your ears. Do not over-clean your ears.  Drink enough fluid to keep your urine clear or pale yellow. This will help to thin the earwax.  Keep all follow-up visits as told by your health care provider. If earwax builds up in your ears often or if you use hearing aids, consider seeing your health care provider for routine, preventive ear cleanings. Ask your health care provider how often you should schedule your cleanings.  If you have hearing aids, clean them according to instructions from the manufacturer and your health care provider. Contact a health care provider if:  You have ear pain.  You develop a fever.  You have blood, pus, or other fluid coming from your ear.  You have hearing loss.  You have ringing in your ears that does not go away.  Your symptoms do not improve with treatment.  You feel like the room is spinning (vertigo). Summary  Earwax can build up in the ear and cause discomfort or hearing loss.  The most common symptoms of this condition include reduced or muffled hearing and a feeling of fullness in the ear or feeling that the ear is plugged.  This condition may be diagnosed based on your symptoms, your medical history, and an ear exam.  This condition may be treated by using ear drops to soften the earwax or by having the earwax removed by a health care provider.  Do   not put any objects, including cotton swabs, into your ear. You can clean the opening of your ear canal with a washcloth or facial tissue. This information is not intended to replace advice given to you by your health care provider. Make sure you discuss any questions you have with your health care provider. Document Released: 01/28/2004 Document Revised: 03/02/2016 Document Reviewed: 03/02/2016 Elsevier Interactive Patient Education  2018 Else[employer] Inc.  

## 2016-07-07 NOTE — Progress Notes (Signed)
   Subjective:    Patient ID: [name], female    DOB: Nov 26, 1950, 66 y.o.   MRN: [id number]  HPI Patient comes in today c/o maxillary pain and headache for > the 1 week. SHe denies any fever but has been taking tylenol.    Review of Systems  Constitutional: Negative for activity change and appetite change.  HENT: Positive for congestion, sinus pain and sinus pressure. Negative for ear pain, sore throat and trouble swallowing.   Respiratory: Negative for cough.   Cardiovascular: Negative.   Gastrointestinal: Negative.   Genitourinary: Negative.   Neurological: Negative.   Psychiatric/Behavioral: Negative.        Objective:   Physical Exam  Constitutional: She is oriented to person, place, and time. She appears well-developed and well-nourished. No distress.  HENT:  Right Ear: Hearing and external ear normal. A foreign body (cerumen impaction) is present.  Left Ear: A foreign body (cerumen impaction) is present.  Nose: Mucosal edema and rhinorrhea present. Right sinus exhibits maxillary sinus tenderness. Left sinus exhibits maxillary sinus tenderness (edema on left).  Mouth/Throat: Uvula is midline, oropharynx is clear and moist and mucous membranes are normal.  Neck: Normal range of motion. Neck supple.  Cardiovascular: Normal rate and regular rhythm.   Pulmonary/Chest: Effort normal and breath sounds normal.  Lymphadenopathy:    She has no cervical adenopathy.  Neurological: She is alert and oriented to person, place, and time.  Skin: Skin is warm.  Psychiatric: She has a normal mood and affect. Her behavior is normal. Judgment and thought content normal.   BP (!) 146/87   Pulse 85   Temp (!) 97.4 F (36.3 C) (Oral)   Ht 4\' 11"  (1.499 m)   Wt 112 lb (50.8 kg)   BMI 22.62 kg/m         Assessment & Plan:  1. Acute non-recurrent maxillary sinusitis 1. Take meds as prescribed 2. Use a cool mist humidifier especially during the winter months and when heat has been  humid. 3. Use saline nose sprays frequently 4. Saline irrigations of the nose can be very helpful if done frequently.  * 4X daily for 1 week*  * Use of a nettie pot can be helpful with this. Follow directions with this* 5. Drink plenty of fluids 6. Keep thermostat turn down low 7.For any cough or congestion  Use plain Mucinex- regular strength or max strength is fine   * Children- consult with Pharmacist for dosing 8. For fever or aces or pains- take tylenol or ibuprofen appropriate for age and weight.  * for fevers greater than 101 orally you may alternate ibuprofen and tylenol every  3 hours.    - doxycycline (VIBRA-TABS) 100 MG tablet; Take 1 tablet (100 mg total) by mouth 2 (two) times daily. 1 po bid  Dispense: 20 tablet; Refill: 0  2. Bilateral impacted cerumen Debrox OTC 2-3 x a week   Mary-Margaret Hassell Done, FNP

## 2016-07-21 ENCOUNTER — Ambulatory Visit (INDEPENDENT_AMBULATORY_CARE_PROVIDER_SITE_OTHER): Payer: PPO | Admitting: Nurse Practitioner

## 2016-07-21 ENCOUNTER — Encounter: Payer: Self-pay | Admitting: Nurse Practitioner

## 2016-07-21 VITALS — BP 149/90 | HR 73 | Temp 97.5°F | Ht 59.0 in | Wt 113.0 lb

## 2016-07-21 DIAGNOSIS — K581 Irritable bowel syndrome with constipation: Secondary | ICD-10-CM

## 2016-07-21 DIAGNOSIS — M8580 Other specified disorders of bone density and structure, unspecified site: Secondary | ICD-10-CM

## 2016-07-21 DIAGNOSIS — F411 Generalized anxiety disorder: Secondary | ICD-10-CM | POA: Diagnosis not present

## 2016-07-21 DIAGNOSIS — K219 Gastro-esophageal reflux disease without esophagitis: Secondary | ICD-10-CM

## 2016-07-21 DIAGNOSIS — E559 Vitamin D deficiency, unspecified: Secondary | ICD-10-CM

## 2016-07-21 DIAGNOSIS — E785 Hyperlipidemia, unspecified: Secondary | ICD-10-CM | POA: Diagnosis not present

## 2016-07-21 MED ORDER — LANSOPRAZOLE 30 MG PO CPDR: DELAYED_RELEASE_CAPSULE | ORAL | 1 refills | Status: DC

## 2016-07-21 MED ORDER — ATORVASTATIN CALCIUM 20 MG PO TABS: 20.0000 mg | ORAL_TABLET | Freq: Every day | ORAL | 3 refills | Status: DC

## 2016-07-21 MED ORDER — ALPRAZOLAM 0.25 MG PO TABS: 0.2500 mg | ORAL_TABLET | Freq: Every evening | ORAL | 2 refills | Status: DC | PRN

## 2016-07-21 NOTE — Patient Instructions (Signed)
DASH Eating Plan DASH stands for "Dietary Approaches to Stop Hypertension." The DASH eating plan is a healthy eating plan that has been shown to reduce high blood pressure (hypertension). It may also reduce your risk for type 2 diabetes, heart disease, and stroke. The DASH eating plan may also help with weight loss. What are tips for following this plan? General guidelines  Avoid eating more than 2,300 mg (milligrams) of salt (sodium) a day. If you have hypertension, you may need to reduce your sodium intake to 1,500 mg a day.  Limit alcohol intake to no more than 1 drink a day for nonpregnant women and 2 drinks a day for men. One drink equals 12 oz of beer, 5 oz of wine, or 1 oz of hard liquor.  Work with your health care provider to maintain a healthy body weight or to lose weight. Ask what an ideal weight is for you.  Get at least 30 minutes of exercise that causes your heart to beat faster (aerobic exercise) most days of the week. Activities may include walking, swimming, or biking.  Work with your health care provider or diet and nutrition specialist (dietitian) to adjust your eating plan to your individual calorie needs. Reading food labels  Check food labels for the amount of sodium per serving. Choose foods with less than 5 percent of the Daily Value of sodium. Generally, foods with less than 300 mg of sodium per serving fit into this eating plan.  To find whole grains, look for the word "whole" as the first word in the ingredient list. Shopping  Buy products labeled as "low-sodium" or "no salt added."  Buy fresh foods. Avoid canned foods and premade or frozen meals. Cooking  Avoid adding salt when cooking. Use salt-free seasonings or herbs instead of table salt or sea salt. Check with your health care provider or pharmacist before using salt substitutes.  Do not fry foods. Cook foods using healthy methods such as baking, boiling, grilling, and broiling instead.  Cook with  heart-healthy oils, such as olive, canola, soybean, or sunflower oil. Meal planning   Eat a balanced diet that includes: ? 5 or more servings of fruits and vegetables each day. At each meal, try to fill half of your plate with fruits and vegetables. ? Up to 6-8 servings of whole grains each day. ? Less than 6 oz of lean meat, poultry, or fish each day. A 3-oz serving of meat is about the same size as a deck of cards. One egg equals 1 oz. ? 2 servings of low-fat dairy each day. ? A serving of nuts, seeds, or beans 5 times each week. ? Heart-healthy fats. Healthy fats called Omega-3 fatty acids are found in foods such as flaxseeds and coldwater fish, like sardines, salmon, and mackerel.  Limit how much you eat of the following: ? Canned or prepackaged foods. ? Food that is high in trans fat, such as fried foods. ? Food that is high in saturated fat, such as fatty meat. ? Sweets, desserts, sugary drinks, and other foods with added sugar. ? Full-fat dairy products.  Do not salt foods before eating.  Try to eat at least 2 vegetarian meals each week.  Eat more home-cooked food and less restaurant, buffet, and fast food.  When eating at a restaurant, ask that your food be prepared with less salt or no salt, if possible. What foods are recommended? The items listed may not be a complete list. Talk with your dietitian about what   dietary choices are best for you. Grains Whole-grain or whole-wheat bread. Whole-grain or whole-wheat pasta. Brown rice. Oatmeal. Quinoa. Bulgur. Whole-grain and low-sodium cereals. Pita bread. Low-fat, low-sodium crackers. Whole-wheat flour tortillas. Vegetables Fresh or frozen vegetables (raw, steamed, roasted, or grilled). Low-sodium or reduced-sodium tomato and vegetable juice. Low-sodium or reduced-sodium tomato sauce and tomato paste. Low-sodium or reduced-sodium canned vegetables. Fruits All fresh, dried, or frozen fruit. Canned fruit in natural juice (without  added sugar). Meat and other protein foods Skinless chicken or turkey. Ground chicken or turkey. Pork with fat trimmed off. Fish and seafood. Egg whites. Dried beans, peas, or lentils. Unsalted nuts, nut butters, and seeds. Unsalted canned beans. Lean cuts of beef with fat trimmed off. Low-sodium, lean deli meat. Dairy Low-fat (1%) or fat-free (skim) milk. Fat-free, low-fat, or reduced-fat cheeses. Nonfat, low-sodium ricotta or cottage cheese. Low-fat or nonfat yogurt. Low-fat, low-sodium cheese. Fats and oils Soft margarine without trans fats. Vegetable oil. Low-fat, reduced-fat, or light mayonnaise and salad dressings (reduced-sodium). Canola, safflower, olive, soybean, and sunflower oils. Avocado. Seasoning and other foods Herbs. Spices. Seasoning mixes without salt. Unsalted popcorn and pretzels. Fat-free sweets. What foods are not recommended? The items listed may not be a complete list. Talk with your dietitian about what dietary choices are best for you. Grains Baked goods made with fat, such as croissants, muffins, or some breads. Dry pasta or rice meal packs. Vegetables Creamed or fried vegetables. Vegetables in a cheese sauce. Regular canned vegetables (not low-sodium or reduced-sodium). Regular canned tomato sauce and paste (not low-sodium or reduced-sodium). Regular tomato and vegetable juice (not low-sodium or reduced-sodium). Pickles. Olives. Fruits Canned fruit in a light or heavy syrup. Fried fruit. Fruit in cream or butter sauce. Meat and other protein foods Fatty cuts of meat. Ribs. Fried meat. Bacon. Sausage. Bologna and other processed lunch meats. Salami. Fatback. Hotdogs. Bratwurst. Salted nuts and seeds. Canned beans with added salt. Canned or smoked fish. Whole eggs or egg yolks. Chicken or turkey with skin. Dairy Whole or 2% milk, cream, and half-and-half. Whole or full-fat cream cheese. Whole-fat or sweetened yogurt. Full-fat cheese. Nondairy creamers. Whipped toppings.  Processed cheese and cheese spreads. Fats and oils Butter. Stick margarine. Lard. Shortening. Ghee. Bacon fat. Tropical oils, such as coconut, palm kernel, or palm oil. Seasoning and other foods Salted popcorn and pretzels. Onion salt, garlic salt, seasoned salt, table salt, and sea salt. Worcestershire sauce. Tartar sauce. Barbecue sauce. Teriyaki sauce. Soy sauce, including reduced-sodium. Steak sauce. Canned and packaged gravies. Fish sauce. Oyster sauce. Cocktail sauce. Horseradish that you find on the shelf. Ketchup. Mustard. Meat flavorings and tenderizers. Bouillon cubes. Hot sauce and Tabasco sauce. Premade or packaged marinades. Premade or packaged taco seasonings. Relishes. Regular salad dressings. Where to find more information:  National Heart, Lung, and Blood Institute: www.nhlbi.nih.gov  American Heart Association: www.heart.org Summary  The DASH eating plan is a healthy eating plan that has been shown to reduce high blood pressure (hypertension). It may also reduce your risk for type 2 diabetes, heart disease, and stroke.  With the DASH eating plan, you should limit salt (sodium) intake to 2,300 mg a day. If you have hypertension, you may need to reduce your sodium intake to 1,500 mg a day.  When on the DASH eating plan, aim to eat more fresh fruits and vegetables, whole grains, lean proteins, low-fat dairy, and heart-healthy fats.  Work with your health care provider or diet and nutrition specialist (dietitian) to adjust your eating plan to your individual   calorie needs. This information is not intended to replace advice given to you by your health care provider. Make sure you discuss any questions you have with your health care provider. Document Released: 12/09/2010 Document Revised: 12/14/2015 Document Reviewed: 12/14/2015 Elsevier Interactive Patient Education  2017 [employer].  

## 2016-07-21 NOTE — Progress Notes (Signed)
Subjective:    Patient ID: [name], female    DOB: 1950-03-07, 66 y.o.   MRN: [id number]  HPI [name] is here today for follow up of chronic medical problem.  Outpatient Encounter Prescriptions as of 07/21/2016  Medication Sig  . ALPRAZolam (XANAX) 0.25 MG tablet Take 1 tablet (0.25 mg total) by mouth at bedtime as needed.  Marland Kitchen aspirin 81 MG chewable tablet Chew by mouth daily.  Marland Kitchen atorvastatin (LIPITOR) 20 MG tablet Take 1 tablet (20 mg total) by mouth daily at 6 PM.  . BIOTIN PO Take by mouth.  Marland Kitchen CALCIUM-MAGNESIUM PO Take by mouth.    . Cranberry 400 MG CAPS Take by mouth.  . Cyanocobalamin (VITAMIN B-12 PO) Take by mouth.    . doxycycline (VIBRA-TABS) 100 MG tablet Take 1 tablet (100 mg total) by mouth 2 (two) times daily. 1 po bid  . lansoprazole (PREVACID) 30 MG capsule TAKE 1 CAPSULE (30 MG TOTAL) BY MOUTH DAILY.  Marland Kitchen loratadine-pseudoephedrine (CLARITIN-D 24-HOUR) 10-240 MG 24 hr tablet Take 1 tablet by mouth daily.  Marland Kitchen LUMIGAN 0.01 % SOLN   . Pyridoxine HCl (VITAMIN B6 PO) Take by mouth.     No facility-administered encounter medications on file as of 07/21/2016.     1. Osteopenia, unspecified location  Patient takes a calcium supplement and participates in weight-bearing exercise.  2. Hyperlipidemia with target LDL less than 100  Managed with atorvastatin.  3. Vitamin D deficiency  Patient not currently taking anything for this.  4. Gastroesophageal reflux disease without esophagitis  Symptoms controlled with daily lansoprazole.  5. Irritable bowel syndrome with constipation Patient takes Miralax as needed for constipation.  Patient has started taking probiotic and this seems to be helping keep her regular.    New complaints: None today.  Social history: No recent changes- has 2 grandchildren one 98 and other 49.   Review of Systems  Constitutional: Negative for activity change, appetite change and fatigue.  Respiratory: Negative for cough, shortness of  breath and wheezing.   Cardiovascular: Negative for chest pain and palpitations.  Gastrointestinal: Negative for abdominal distention and abdominal pain.  Neurological: Negative for dizziness, light-headedness and headaches.  All other systems reviewed and are negative.      Objective:   Physical Exam  Constitutional: She is oriented to person, place, and time. She appears well-developed and well-nourished. No distress.  HENT:  Head: Normocephalic.  Right Ear: External ear normal.  Left Ear: External ear normal.  Mouth/Throat: Oropharynx is clear and moist.  Eyes: Pupils are equal, round, and reactive to light.  Neck: Normal range of motion. Neck supple. No JVD present. No thyromegaly present.  Cardiovascular: Normal rate, regular rhythm, normal heart sounds and intact distal pulses.   No murmur heard. Pulmonary/Chest: Effort normal and breath sounds normal. No respiratory distress. She has no wheezes.  Abdominal: Soft. Bowel sounds are normal. She exhibits no distension. There is no tenderness.  Musculoskeletal: Normal range of motion. She exhibits no edema.  Lymphadenopathy:    She has no cervical adenopathy.  Neurological: She is alert and oriented to person, place, and time.  Skin: Skin is warm and dry.  Psychiatric: She has a normal mood and affect. Her behavior is normal. Judgment and thought content normal.   BP (!) 149/90   Pulse 73   Temp (!) 97.5 F (36.4 C) (Oral)   Ht 4' 11"  (1.499 m)   Wt 113 lb (51.3 kg)   BMI 22.82 kg/m  Assessment & Plan:  1. Osteopenia, unspecified location Weight bearing exercises  2. Hyperlipidemia with target LDL less than 100 Low fat diet - atorvastatin (LIPITOR) 20 MG tablet; Take 1 tablet (20 mg total) by mouth daily at 6 PM.  Dispense: 90 tablet; Refill: 3 - CMP14+EGFR - Lipid panel  3. Vitamin D deficiency  4. Gastroesophageal reflux disease without esophagitis Avoid spicy foods Do not eat 2 hours prior to bedtime -  lansoprazole (PREVACID) 30 MG capsule; TAKE 1 CAPSULE (30 MG TOTAL) BY MOUTH DAILY.  Dispense: 90 capsule; Refill: 1  5. Irritable bowel syndrome with constipation Watch diet Imodium OTC as needed  6. GAD (generalized anxiety disorder) Stress management - ALPRAZolam (XANAX) 0.25 MG tablet; Take 1 tablet (0.25 mg total) by mouth at bedtime as needed.  Dispense: 30 tablet; Refill: 2   Blood pressure slightly elevated at appointment- she was told to check blood pressure at home randomly daily. If remains above 076 systolic consistently will start her on HCTZ. Labs pending Health maintenance reviewed Diet and exercise encouraged Continue all meds Follow up  In 6 months   [place], FNP

## 2016-07-22 LAB — CMP14+EGFR
ALK PHOS: 72 IU/L (ref 39–117)
ALT: 31 IU/L (ref 0–32)
AST: 29 IU/L (ref 0–40)
Albumin/Globulin Ratio: 2 (ref 1.2–2.2)
Albumin: 4.5 g/dL (ref 3.6–4.8)
BILIRUBIN TOTAL: 0.3 mg/dL (ref 0.0–1.2)
BUN/Creatinine Ratio: 17 (ref 12–28)
BUN: 11 mg/dL (ref 8–27)
CHLORIDE: 102 mmol/L (ref 96–106)
CO2: 25 mmol/L (ref 20–29)
Calcium: 10.1 mg/dL (ref 8.7–10.3)
Creatinine, Ser: 0.66 mg/dL (ref 0.57–1.00)
GFR calc Af Amer: 106 mL/min/{1.73_m2} (ref >59)
GFR calc non Af Amer: 92 mL/min/{1.73_m2} (ref >59)
GLUCOSE: 81 mg/dL (ref 65–99)
Globulin, Total: 2.2 g/dL (ref 1.5–4.5)
POTASSIUM: 4.4 mmol/L (ref 3.5–5.2)
Sodium: 143 mmol/L (ref 134–144)
Total Protein: 6.7 g/dL (ref 6.0–8.5)

## 2016-07-22 LAB — LIPID PANEL
CHOLESTEROL TOTAL: 155 mg/dL (ref 100–199)
Chol/HDL Ratio: 2.9 ratio (ref 0.0–4.4)
HDL: 54 mg/dL (ref >39)
LDL Calculated: 69 mg/dL (ref 0–99)
Triglycerides: 158 mg/dL — ABNORMAL HIGH (ref 0–149)
VLDL CHOLESTEROL CAL: 32 mg/dL (ref 5–40)

## 2016-08-05 ENCOUNTER — Telehealth: Payer: Self-pay | Admitting: Nurse Practitioner

## 2016-08-05 NOTE — Telephone Encounter (Signed)
  BP Readings taken around noon Concerned with fluctuating BP  7/20  141/81 7/22  151/88 7/23  139/90 7/24  146/86 7/25  119/76 7/26  129/75 7/27  150/81 7/29  136/84  7/30  119/74  7/31  120/74 8/2   126/84 8/3   140/91   134/80

## 2016-08-11 NOTE — Telephone Encounter (Signed)
Pt aware of recommendation °

## 2016-08-11 NOTE — Telephone Encounter (Signed)
Keep checking BPs. If high check again in 5 min. If regularly getting numbers over 140 let us know

## 2016-08-18 ENCOUNTER — Other Ambulatory Visit: Payer: Self-pay | Admitting: *Deleted

## 2016-08-18 DIAGNOSIS — E785 Hyperlipidemia, unspecified: Secondary | ICD-10-CM

## 2016-08-18 MED ORDER — ATORVASTATIN CALCIUM 20 MG PO TABS: 20.0000 mg | ORAL_TABLET | Freq: Every day | ORAL | 1 refills | Status: DC

## 2016-08-19 ENCOUNTER — Other Ambulatory Visit: Payer: Self-pay | Admitting: Nurse Practitioner

## 2016-08-19 DIAGNOSIS — E785 Hyperlipidemia, unspecified: Secondary | ICD-10-CM

## 2016-09-27 DIAGNOSIS — H40053 Ocular hypertension, bilateral: Secondary | ICD-10-CM | POA: Diagnosis not present

## 2016-09-27 DIAGNOSIS — H40013 Open angle with borderline findings, low risk, bilateral: Secondary | ICD-10-CM | POA: Diagnosis not present

## 2016-09-27 DIAGNOSIS — Z83511 Family history of glaucoma: Secondary | ICD-10-CM | POA: Diagnosis not present

## 2016-09-27 DIAGNOSIS — H2513 Age-related nuclear cataract, bilateral: Secondary | ICD-10-CM | POA: Diagnosis not present

## 2016-10-18 ENCOUNTER — Other Ambulatory Visit: Payer: Self-pay | Admitting: Gynecology

## 2016-10-18 DIAGNOSIS — Z1231 Encounter for screening mammogram for malignant neoplasm of breast: Secondary | ICD-10-CM

## 2016-10-19 ENCOUNTER — Ambulatory Visit (INDEPENDENT_AMBULATORY_CARE_PROVIDER_SITE_OTHER): Payer: PPO | Admitting: *Deleted

## 2016-10-19 ENCOUNTER — Encounter: Payer: Self-pay | Admitting: *Deleted

## 2016-10-19 ENCOUNTER — Ambulatory Visit (INDEPENDENT_AMBULATORY_CARE_PROVIDER_SITE_OTHER): Payer: PPO

## 2016-10-19 VITALS — BP 152/96 | HR 80 | Ht 58.5 in | Wt 111.0 lb

## 2016-10-19 DIAGNOSIS — Z0000 Encounter for general adult medical examination without abnormal findings: Secondary | ICD-10-CM | POA: Diagnosis not present

## 2016-10-19 DIAGNOSIS — Z23 Encounter for immunization: Secondary | ICD-10-CM

## 2016-10-19 NOTE — Patient Instructions (Signed)
  Ms. [name] , Thank you for taking time to come for your Medicare Wellness Visit. I appreciate your ongoing commitment to your health goals. Please review the following plan we discussed and let me know if I can assist you in the future.   These are the goals we discussed: Goals    . Exercise 150 minutes per week (moderate activity)       This is a list of the screening recommended for you and due dates:  Health Maintenance  Topic Date Due  . Mammogram  11/08/2017  . Colon Cancer Screening  10/22/2019  . Tetanus Vaccine  05/13/2021  . Flu Shot  Completed  . DEXA scan (bone density measurement)  Completed  .  Hepatitis C: One time screening is recommended by Center for Disease Control  (CDC) for  adults born from [birth year] through 1966.   Completed  . Pneumonia vaccines  Completed

## 2016-10-19 NOTE — Progress Notes (Signed)
Subjective:   [name] is a 65 y.o. female who presents for an Initial Medicare Annual Wellness Visit. [name] lives at home with her husband. They have one adult daughter, one grandson, and one granddaughter. She is retired from [employer]. She is very active and goes to the recreation department to workout regularly. She also enjoys cycling and has been in several 20 and 40 mile races.   Review of Systems    Reports that her health is about the same a last year.   Cardiac Risk Factors include: advanced age (>15men, >10 women);dyslipidemia   Other systems negative today     Objective:    Today's Vitals   10/19/16 1252  BP: (!) 152/96  Pulse: 80  Weight: 111 lb (50.3 kg)  Height: 4' 10.5" (1.486 m)   Body mass index is 22.8 kg/m.   Current Medications (verified) Outpatient Encounter Prescriptions as of 10/19/2016  Medication Sig  . ALPRAZolam (XANAX) 0.25 MG tablet Take 1 tablet (0.25 mg total) by mouth at bedtime as needed.  Marland Kitchen aspirin 81 MG chewable tablet Chew by mouth daily.  Marland Kitchen atorvastatin (LIPITOR) 20 MG tablet TAKE 1 TABLET (20 MG TOTAL) BY MOUTH DAILY AT 6 PM.  . BIOTIN PO Take by mouth.  Marland Kitchen CALCIUM-MAGNESIUM PO Take by mouth.    . Cranberry 400 MG CAPS Take by mouth.  . Cyanocobalamin (VITAMIN B-12 PO) Take by mouth.    . lansoprazole (PREVACID) 30 MG capsule TAKE 1 CAPSULE (30 MG TOTAL) BY MOUTH DAILY.  Marland Kitchen loratadine-pseudoephedrine (CLARITIN-D 24-HOUR) 10-240 MG 24 hr tablet Take 1 tablet by mouth daily.  Marland Kitchen LUMIGAN 0.01 % SOLN   . Pyridoxine HCl (VITAMIN B6 PO) Take by mouth.    . [DISCONTINUED] atorvastatin (LIPITOR) 20 MG tablet Take 1 tablet (20 mg total) by mouth daily at 6 PM.  . [DISCONTINUED] doxycycline (VIBRA-TABS) 100 MG tablet Take 1 tablet (100 mg total) by mouth 2 (two) times daily. 1 po bid   No facility-administered encounter medications on file as of 10/19/2016.     Allergies (verified) Codeine and Sulfonamide derivatives    History: Past Medical History:  Diagnosis Date  . Allergic rhinitis   . Cerumen impaction    right   . Chest discomfort   . Chronic constipation   . Diverticulosis   . GERD (gastroesophageal reflux disease)   . Hyperlipidemia   . Kidney stones   . Nasal congestion   . Osteopenia 12/2015   T score -1.9 FRAX 9.9%/1.5% stable from prior DEXA  . Palpitation   . PUD (peptic ulcer disease)   . Scoliosis   . Sinusitis   . Vitamin D deficiency    Past Surgical History:  Procedure Laterality Date  . CARDIAC CATHETERIZATION    . COLPOSCOPY    . excision of mole  2007  . GYNECOLOGIC CRYOSURGERY    . LAPAROSCOPIC CHOLECYSTECTOMY    . TUBAL LIGATION  1991   Family History  Problem Relation Age of Onset  . Hypertension Mother   . Heart disease Mother   . Hyperlipidemia Mother   . Hypertension Father   . Cancer Father        LUNG  . Hyperlipidemia Father   . Diabetes Sister   . Hypertension Sister   . Hyperlipidemia Sister   . Fibromyalgia Daughter   . Asthma Daughter    Social History   Occupational History  . Not on file.   Social History Main Topics  .  Smoking status: Never Smoker  . Smokeless tobacco: Never Used  . Alcohol use 0.0 oz/week     Comment: rare  . Drug use: No  . Sexual activity: Yes    Birth control/ protection: Post-menopausal, Surgical     Comment: Tubal lig-1st intercourse 66 yo-Fewer than 5 partners    Tobacco Counseling No tobacco use  Activities of Daily Living In your present state of health, do you have any difficulty performing the following activities: 10/19/2016  Hearing? N  Vision? N  Comment Glaucoma  Difficulty concentrating or making decisions? N  Walking or climbing stairs? N  Dressing or bathing? N  Doing errands, shopping? N  Preparing Food and eating ? N  Using the Toilet? N  In the past six months, have you accidently leaked urine? N  Do you have problems with loss of bowel control? N  Managing your Medications? N   Managing your Finances? N  Housekeeping or managing your Housekeeping? N  Some recent data might be hidden    Immunizations and Health Maintenance Immunization History  Administered Date(s) Administered  . Influenza, High Dose Seasonal PF 10/19/2016  . Influenza,inj,Quad PF,6+ Mos 10/14/2015  . Influenza-Unspecified 11/18/2014  . Pneumococcal Conjugate-13 07/11/2014  . Pneumococcal Polysaccharide-23 07/21/2015  . Tdap 05/14/2011  . Zoster 04/04/2011   There are no preventive care reminders to display for this patient.  Patient Care Team: Chevis Pretty, FNP as PCP - General (Family Medicine)  Venetia Night, OD-optometry  No hospitalizations, ER visits, or surgeries this past year.      Assessment:   This is a routine wellness examination for [name].   Hearing/Vision screen No deficits noted. Eye exam up to date.    Dietary issues and exercise activities discussed: Current Exercise Habits: Structured exercise class (Recreation department), Type of exercise: treadmill;walking;Other - see comments (eliptical, and cycling (about 20 miles)), Time (Minutes): 60, Intensity: Moderate, Exercise limited by: None identified  Goals    . Exercise 150 minutes per week (moderate activity)      Depression Screen PHQ 2/9 Scores 10/19/2016 07/21/2016 07/07/2016 02/29/2016 01/22/2016 07/21/2015 07/01/2015  PHQ - 2 Score 0 0 0 0 0 0 0    Fall Risk Fall Risk  10/19/2016 07/21/2016 07/07/2016 02/29/2016 01/22/2016  Falls in the past year? Yes No No No No  Number falls in past yr: 1 - - - -  Injury with Fall? No - - - -  Follow up Falls prevention discussed - - - -    Cognitive Function: MMSE - Mini Mental State Exam 10/19/2016  Orientation to time 5  Orientation to Place 5  Registration 3  Attention/ Calculation 5  Recall 3  Language- name 2 objects 2  Language- repeat 1  Language- follow 3 step command 3  Language- read & follow direction 1  Write a sentence 1  Copy design 1   Total score 30  Normal exam      Screening Tests Health Maintenance  Topic Date Due  . MAMMOGRAM  11/08/2017  . COLONOSCOPY  10/22/2019  . TETANUS/TDAP  05/13/2021  . INFLUENZA VACCINE  Completed  . DEXA SCAN  Completed  . Hepatitis C Screening  Completed  . PNA vac Low Risk Adult  Completed      Plan:  Continue to stay active Move carefully to avoid falls FOBT given today Flu shot given today Mammogram scheduled for next month  I have personally reviewed and noted the following in the patient's chart:   .  Medical and social history . Use of alcohol, tobacco or illicit drugs  . Current medications and supplements . Functional ability and status . Nutritional status . Physical activity . Advanced directives . List of other physicians . Hospitalizations, surgeries, and ER visits in previous 12 months . Vitals . Screenings to include cognitive, depression, and falls . Referrals and appointments  In addition, I have reviewed and discussed with patient certain preventive protocols, quality metrics, and best practice recommendations. A written personalized care plan for preventive services as well as general preventive health recommendations were provided to patient.     Chong Sicilian, RN   10/19/2016   I have reviewed and agree with the above AWV documentation.   Mary-Margaret Hassell Done, FNP

## 2016-10-20 ENCOUNTER — Other Ambulatory Visit: Payer: PPO

## 2016-10-20 DIAGNOSIS — Z1211 Encounter for screening for malignant neoplasm of colon: Secondary | ICD-10-CM

## 2016-10-25 NOTE — Progress Notes (Signed)
Patient notified

## 2016-10-27 LAB — FECAL OCCULT BLOOD, IMMUNOCHEMICAL: Fecal Occult Bld: NEGATIVE

## 2016-11-15 ENCOUNTER — Ambulatory Visit (INDEPENDENT_AMBULATORY_CARE_PROVIDER_SITE_OTHER): Payer: PPO | Admitting: Gynecology

## 2016-11-15 ENCOUNTER — Ambulatory Visit
Admission: RE | Admit: 2016-11-15 | Discharge: 2016-11-15 | Disposition: A | Discharge status: Home / Self Care | Payer: PPO | Source: Ambulatory Visit | Attending: Gynecology | Admitting: Gynecology

## 2016-11-15 ENCOUNTER — Encounter: Payer: Self-pay | Admitting: Gynecology

## 2016-11-15 VITALS — BP 144/86 | Ht 59.0 in | Wt 113.0 lb

## 2016-11-15 DIAGNOSIS — Z01411 Encounter for gynecological examination (general) (routine) with abnormal findings: Secondary | ICD-10-CM

## 2016-11-15 DIAGNOSIS — M8580 Other specified disorders of bone density and structure, unspecified site: Secondary | ICD-10-CM

## 2016-11-15 DIAGNOSIS — Z1231 Encounter for screening mammogram for malignant neoplasm of breast: Secondary | ICD-10-CM

## 2016-11-15 DIAGNOSIS — N952 Postmenopausal atrophic vaginitis: Secondary | ICD-10-CM

## 2016-11-15 NOTE — Progress Notes (Signed)
    [name] [birth year] [id number]        66 y.o.  G1P1001 for breast and pelvic exam  Past medical history,surgical history, problem list, medications, allergies, family history and social history were all reviewed and documented as reviewed in the EPIC chart.  ROS:  Performed with pertinent positives and negatives included in the history, assessment and plan.   Additional significant findings : None   Exam: [name] assistant Vitals:   11/15/16 0759  BP: (!) 144/86  Weight: 113 lb (51.3 kg)  Height: 4\' 11"  (1.499 m)   Body mass index is 22.82 kg/m.  General appearance:  Normal affect, orientation and appearance. Skin: Grossly normal HEENT: Without gross lesions.  No cervical or supraclavicular adenopathy. Thyroid normal.  Lungs:  Clear without wheezing, rales or rhonchi Cardiac: RR, without RMG Abdominal:  Soft, nontender, without masses, guarding, rebound, organomegaly or hernia Breasts:  Examined lying and sitting without masses, retractions, discharge or axillary adenopathy. Pelvic:  Ext, BUS, Vagina: With atrophic changes  Cervix: With atrophic changes  Uterus: Anteverted, normal size, shape and contour, midline and mobile nontender   Adnexa: Without masses or tenderness    Anus and perineum: Normal   Rectovaginal: Normal sphincter tone without palpated masses or tenderness.    Assessment/Plan:  66 y.o. G12P1001 female for breast and pelvic exam.   1. Postmenopausal/atrophic genital changes.  No significant hot flushes, night sweats, vaginal dryness or any vaginal bleeding.  Continue to monitor and report any issues or bleeding. 2. Osteopenia.  DEXA 12/2015 T score -1.9.  FRAX 9.9% / 1.5%.  Stable from prior DEXA.  Plan repeat DEXA in several years. 3. Intermittent anxiety.  Uses Xanax 0.25 mg as needed good results.  Has supply at home but will call if needs more. 4. Pap smear 11/2014.  No Pap smear done today.  History of cryosurgery a number of years ago with  normal Pap smears since.  Plan repeat Pap smear next year at 3-year interval per current screening guidelines. 5. Mammography today.  Breast exam normal.  SBE monthly reviewed. 6. Colonoscopy 2011.  Repeat at their recommended interval. 7. Health maintenance.  Blood pressure 144/86.  Patient is following it at home for her primary physician.  She will continue to do so and follow-up with them in reference to management.  No routine lab work done as patient reports is done elsewhere.  Follow-up in 1 year, sooner as needed.   Anastasio Auerbach MD, 8:21 AM 11/15/2016

## 2016-11-15 NOTE — Patient Instructions (Addendum)
Continue to follow your blood pressure at home.  Record the values and follow-up with your primary physician for management.  Follow-up in 1 year, sooner if any issues.

## 2016-12-02 ENCOUNTER — Ambulatory Visit: Payer: PPO | Admitting: Pediatrics

## 2016-12-02 ENCOUNTER — Encounter: Payer: Self-pay | Admitting: Pediatrics

## 2016-12-02 VITALS — BP 139/81 | HR 84 | Temp 97.5°F | Ht 59.0 in | Wt 113.8 lb

## 2016-12-02 DIAGNOSIS — J069 Acute upper respiratory infection, unspecified: Secondary | ICD-10-CM

## 2016-12-02 DIAGNOSIS — H109 Unspecified conjunctivitis: Secondary | ICD-10-CM

## 2016-12-02 MED ORDER — FLUTICASONE PROPIONATE 50 MCG/ACT NA SUSP: 2.0000 | Freq: Every day | NASAL | 6 refills | Status: DC

## 2016-12-02 MED ORDER — POLYMYXIN B-TRIMETHOPRIM 10000-0.1 UNIT/ML-% OP SOLN: 1.0000 [drp] | Freq: Four times a day (QID) | OPHTHALMIC | 0 refills | Status: DC

## 2016-12-02 NOTE — Patient Instructions (Signed)
Nasal congestion: Netipot with distilled water 2-3 times a day to clear out sinuses Or Normal saline nasal spray Flonase steroid nasal spray  For sore throat can use: tylenol Throat lozenges chloroseptic spray  Stick with bland foods Drink lots of fluids

## 2016-12-02 NOTE — Progress Notes (Signed)
  Subjective:   Patient ID: [name], female    DOB: [birth year], 66 y.o.   MRN: [id number] CC: Nasal Congestion and Facial Pain  HPI: [name] is a 66 y.o. female presenting for Nasal Congestion and Facial Pain  Symptoms started 2 days ago Has had irritation in her nose Lots of sinus drainage Some sinus pressure on the right side more than left side There is a been popping off and on This morning her left eye has been red with some drainage throughout the day Felt slightly scratchy and irritated, no pain Appetite has been okay No fevers Breathing has been fine Minimal coughing Small amount of sore throat  Relevant past medical, surgical, family and social history reviewed. Allergies and medications reviewed and updated. Social History   Tobacco Use  Smoking Status Never Smoker  Smokeless Tobacco Never Used   ROS: Per HPI   Objective:    BP 139/81   Pulse 84   Temp (!) 97.5 F (36.4 C) (Oral)   Ht 4\' 11"  (1.499 m)   Wt 113 lb 12.8 oz (51.6 kg)   BMI 22.98 kg/m   Wt Readings from Last 3 Encounters:  12/02/16 113 lb 12.8 oz (51.6 kg)  11/15/16 113 lb (51.3 kg)  10/19/16 111 lb (50.3 kg)    Gen: NAD, alert, cooperative with exam, NCAT EYES: EOMI, LEFT eye with mild injection to the conjunctiva, clear to greenish discharge matted lower lid.  ENT: Left TM normal pearly gray, right TM obscured by cerumen, OP with mild erythema LYMPH: no cervical LAD CV: NRRR, normal S1/S2 Resp: CTABL, no wheezes, normal WOB Abd: +BS, soft, NTND. no guarding or organomegaly Ext: No edema, warm Neuro: Alert and oriented MSK: normal muscle bulk  Assessment & Plan:  [name] was seen today for nasal congestion and facial pain.  Diagnoses and all orders for this visit:  Bacterial conjunctivitis of left eye Lots of drainage today, will start below -     trimethoprim-polymyxin b (POLYTRIM) ophthalmic solution; Place 1 drop into the left eye 4 (four) times daily.  Acute  URI Symptoms started 2 days ago, no fevers, normal appetite Discussed symptomatic care, return precautions -     fluticasone (FLONASE) 50 MCG/ACT nasal spray; Place 2 sprays into both nostrils daily.   Follow up plan: Return if symptoms worsen or fail to improve. Assunta Found, MD [place]

## 2017-01-23 ENCOUNTER — Ambulatory Visit (INDEPENDENT_AMBULATORY_CARE_PROVIDER_SITE_OTHER): Payer: PPO | Admitting: Nurse Practitioner

## 2017-01-23 ENCOUNTER — Encounter: Payer: Self-pay | Admitting: Nurse Practitioner

## 2017-01-23 VITALS — BP 132/81 | HR 87 | Temp 97.4°F | Ht 59.0 in | Wt 112.0 lb

## 2017-01-23 DIAGNOSIS — M8580 Other specified disorders of bone density and structure, unspecified site: Secondary | ICD-10-CM | POA: Diagnosis not present

## 2017-01-23 DIAGNOSIS — E785 Hyperlipidemia, unspecified: Secondary | ICD-10-CM | POA: Diagnosis not present

## 2017-01-23 DIAGNOSIS — K219 Gastro-esophageal reflux disease without esophagitis: Secondary | ICD-10-CM | POA: Diagnosis not present

## 2017-01-23 DIAGNOSIS — K581 Irritable bowel syndrome with constipation: Secondary | ICD-10-CM

## 2017-01-23 DIAGNOSIS — E559 Vitamin D deficiency, unspecified: Secondary | ICD-10-CM | POA: Diagnosis not present

## 2017-01-23 MED ORDER — ATORVASTATIN CALCIUM 20 MG PO TABS: 20.0000 mg | ORAL_TABLET | Freq: Every day | ORAL | 1 refills | Status: DC

## 2017-01-23 MED ORDER — LANSOPRAZOLE 30 MG PO CPDR: DELAYED_RELEASE_CAPSULE | ORAL | 1 refills | Status: DC

## 2017-01-23 NOTE — Patient Instructions (Signed)
Bone Health Bones protect organs, store calcium, and anchor muscles. Good health habits, such as eating nutritious foods and exercising regularly, are important for maintaining healthy bones. They can also help to prevent a condition that causes bones to lose density and become weak and brittle (osteoporosis). Why is bone mass important? Bone mass refers to the amount of bone tissue that you have. The higher your bone mass, the stronger your bones. An important step toward having healthy bones throughout life is to have strong and dense bones during childhood. A young adult who has a high bone mass is more likely to have a high bone mass later in life. Bone mass at its greatest it is called peak bone mass. A large decline in bone mass occurs in older adults. In women, it occurs about the time of menopause. During this time, it is important to practice good health habits, because if more bone is lost than what is replaced, the bones will become less healthy and more likely to break (fracture). If you find that you have a low bone mass, you may be able to prevent osteoporosis or further bone loss by changing your diet and lifestyle. How can I find out if my bone mass is low? Bone mass can be measured with an X-ray test that is called a bone mineral density (BMD) test. This test is recommended for all women who are age 65 or older. It may also be recommended for men who are age 70 or older, or for people who are more likely to develop osteoporosis due to:  Having bones that break easily.  Having a long-term disease that weakens bones, such as kidney disease or rheumatoid arthritis.  Having menopause earlier than normal.  Taking medicine that weakens bones, such as steroids, thyroid hormones, or hormone treatment for breast cancer or prostate cancer.  Smoking.  Drinking three or more alcoholic drinks each day.  What are the nutritional recommendations for healthy bones? To have healthy bones, you  need to get enough of the right minerals and vitamins. Most nutrition experts recommend getting these nutrients from the foods that you eat. Nutritional recommendations vary from person to person. Ask your health care provider what is healthy for you. Here are some general guidelines. Calcium Recommendations Calcium is the most important (essential) mineral for bone health. Most people can get enough calcium from their diet, but supplements may be recommended for people who are at risk for osteoporosis. Good sources of calcium include:  Dairy products, such as low-fat or nonfat milk, cheese, and yogurt.  Dark green leafy vegetables, such as bok choy and broccoli.  Calcium-fortified foods, such as orange juice, cereal, bread, soy beverages, and tofu products.  Nuts, such as almonds.  Follow these recommended amounts for daily calcium intake:  Children, age 1?3: 700 mg.  Children, age 4?8: 1,000 mg.  Children, age 9?13: 1,300 mg.  Teens, age 14?18: 1,300 mg.  Adults, age 19?50: 1,000 mg.  Adults, age 51?70: ? Men: 1,000 mg. ? Women: 1,200 mg.  Adults, age 71 or older: 1,200 mg.  Pregnant and breastfeeding females: ? Teens: 1,300 mg. ? Adults: 1,000 mg.  Vitamin D Recommendations Vitamin D is the most essential vitamin for bone health. It helps the body to absorb calcium. Sunlight stimulates the skin to make vitamin D, so be sure to get enough sunlight. If you live in a cold climate or you do not get outside often, your health care provider may recommend that you take vitamin   D supplements. Good sources of vitamin D in your diet include:  Egg yolks.  Saltwater fish.  Milk and cereal fortified with vitamin D.  Follow these recommended amounts for daily vitamin D intake:  Children and teens, age 1?18: 600 international units.  Adults, age 50 or younger: 400-800 international units.  Adults, age 51 or older: 800-1,000 international units.  Other Nutrients Other nutrients  for bone health include:  Phosphorus. This mineral is found in meat, poultry, dairy foods, nuts, and legumes. The recommended daily intake for adult men and adult women is 700 mg.  Magnesium. This mineral is found in seeds, nuts, dark green vegetables, and legumes. The recommended daily intake for adult men is 400?420 mg. For adult women, it is 310?320 mg.  Vitamin K. This vitamin is found in green leafy vegetables. The recommended daily intake is 120 mg for adult men and 90 mg for adult women.  What type of physical activity is best for building and maintaining healthy bones? Weight-bearing and strength-building activities are important for building and maintaining peak bone mass. Weight-bearing activities cause muscles and bones to work against gravity. Strength-building activities increases muscle strength that supports bones. Weight-bearing and muscle-building activities include:  Walking and hiking.  Jogging and running.  Dancing.  Gym exercises.  Lifting weights.  Tennis and racquetball.  Climbing stairs.  Aerobics.  Adults should get at least 30 minutes of moderate physical activity on most days. Children should get at least 60 minutes of moderate physical activity on most days. Ask your health care provide what type of exercise is best for you. Where can I find more information? For more information, check out the following websites:  National Osteoporosis Foundation: http://nof.org/learn/basics  National Institutes of Health: http://www.niams.nih.gov/Health_Info/Bone/Bone_Health/bone_health_for_life.asp  This information is not intended to replace advice given to you by your health care provider. Make sure you discuss any questions you have with your health care provider. Document Released: 03/12/2003 Document Revised: 07/10/2015 Document Reviewed: 12/25/2013 Elsevier Interactive Patient Education  2018 Elsevier Inc.  

## 2017-01-23 NOTE — Progress Notes (Signed)
Subjective:    Patient ID: [name], female    DOB: [birth year], 67 y.o.   MRN: [id number]  HPI  ZAMORAH AILES is here today for follow up of chronic medical problem.  Outpatient Encounter Medications as of 01/23/2017  Medication Sig  . ALPRAZolam (XANAX) 0.25 MG tablet Take 1 tablet (0.25 mg total) by mouth at bedtime as needed.  Marland Kitchen aspirin 81 MG chewable tablet Chew by mouth daily.  Marland Kitchen atorvastatin (LIPITOR) 20 MG tablet TAKE 1 TABLET (20 MG TOTAL) BY MOUTH DAILY AT 6 PM.  . BIOTIN PO Take by mouth.  Marland Kitchen CALCIUM-MAGNESIUM PO Take by mouth.    . Cranberry 400 MG CAPS Take by mouth.  . Cyanocobalamin (VITAMIN B-12 PO) Take by mouth.    . fluticasone (FLONASE) 50 MCG/ACT nasal spray Place 2 sprays into both nostrils daily.  . lansoprazole (PREVACID) 30 MG capsule TAKE 1 CAPSULE (30 MG TOTAL) BY MOUTH DAILY.  Marland Kitchen loratadine-pseudoephedrine (CLARITIN-D 24-HOUR) 10-240 MG 24 hr tablet Take 1 tablet by mouth daily.  Marland Kitchen LUMIGAN 0.01 % SOLN   . Pyridoxine HCl (VITAMIN B6 PO) Take by mouth.    . trimethoprim-polymyxin b (POLYTRIM) ophthalmic solution Place 1 drop into the left eye 4 (four) times daily.     1. Gastroesophageal reflux disease without esophagitis  Takes prevacid as needed. Diet really helps with control of symptoms.  2. Irritable bowel syndrome with constipation  Actually is using probiotic daily and she is doing much better. Has daily bowel movemnet.  3. Osteopenia, unspecified location  No c/o back pain. OTC calcium. Works out at [employer] a week  4. Hyperlipidemia with target LDL less than 100  Watches diet  5. Vitamin D deficiency  Is currently not taking anything.    New complaints: None today  Social history: Lives with husband- is retired    Review of Systems  Constitutional: Negative for activity change and appetite change.  HENT: Negative.   Eyes: Negative for pain.  Respiratory: Negative for shortness of breath.   Cardiovascular: Negative for chest pain,  palpitations and leg swelling.  Gastrointestinal: Negative for abdominal pain.  Endocrine: Negative for polydipsia.  Genitourinary: Negative.   Skin: Negative for rash.  Neurological: Negative for dizziness, weakness and headaches.  Hematological: Does not bruise/bleed easily.  Psychiatric/Behavioral: Negative.   All other systems reviewed and are negative.      Objective:   Physical Exam  Constitutional: She is oriented to person, place, and time. She appears well-developed and well-nourished.  HENT:  Nose: Nose normal.  Mouth/Throat: Oropharynx is clear and moist.  Eyes: EOM are normal.  Neck: Trachea normal, normal range of motion and full passive range of motion without pain. Neck supple. No JVD present. Carotid bruit is not present. No thyromegaly present.  Cardiovascular: Normal rate, regular rhythm, normal heart sounds and intact distal pulses. Exam reveals no gallop and no friction rub.  No murmur heard. Pulmonary/Chest: Effort normal and breath sounds normal.  Abdominal: Soft. Bowel sounds are normal. She exhibits no distension and no mass. There is no tenderness.  Musculoskeletal: Normal range of motion.  Lymphadenopathy:    She has no cervical adenopathy.  Neurological: She is alert and oriented to person, place, and time. She has normal reflexes.  Skin: Skin is warm and dry.  Psychiatric: She has a normal mood and affect. Her behavior is normal. Judgment and thought content normal.   BP 132/81   Pulse 87   Temp (!) 97.4  F (36.3 C) (Oral)   Ht 4' 11"  (1.499 m)   Wt 112 lb (50.8 kg)   BMI 22.62 kg/m        Assessment & Plan:  1. Gastroesophageal reflux disease without esophagitis Avoid spicy foods Do not eat 2 hours prior to bedtime - lansoprazole (PREVACID) 30 MG capsule; TAKE 1 CAPSULE (30 MG TOTAL) BY MOUTH DAILY.  Dispense: 90 capsule; Refill: 1  2. Irritable bowel syndrome with constipation Continue probiotic  3. Osteopenia, unspecified  location Continue weight bearing exercises  4. Hyperlipidemia with target LDL less than 100 Low fat diet - atorvastatin (LIPITOR) 20 MG tablet; Take 1 tablet (20 mg total) by mouth daily at 6 PM.  Dispense: 90 tablet; Refill: 1 - CMP14+EGFR - Lipid panel  5. Vitamin D deficiency Labs panding    Labs pending Health maintenance reviewed Diet and exercise encouraged Continue all meds Follow up  In 6 months   [place]ar, FNP

## 2017-01-24 LAB — LIPID PANEL
Chol/HDL Ratio: 3.1 ratio (ref 0.0–4.4)
Cholesterol, Total: 158 mg/dL (ref 100–199)
HDL: 51 mg/dL (ref >39)
LDL Calculated: 74 mg/dL (ref 0–99)
Triglycerides: 165 mg/dL — ABNORMAL HIGH (ref 0–149)
VLDL CHOLESTEROL CAL: 33 mg/dL (ref 5–40)

## 2017-01-24 LAB — CMP14+EGFR
ALT: 31 IU/L (ref 0–32)
AST: 31 IU/L (ref 0–40)
Albumin/Globulin Ratio: 2.1 (ref 1.2–2.2)
Albumin: 4.5 g/dL (ref 3.6–4.8)
Alkaline Phosphatase: 76 IU/L (ref 39–117)
BILIRUBIN TOTAL: 0.3 mg/dL (ref 0.0–1.2)
BUN/Creatinine Ratio: 15 (ref 12–28)
BUN: 10 mg/dL (ref 8–27)
CHLORIDE: 103 mmol/L (ref 96–106)
CO2: 23 mmol/L (ref 20–29)
Calcium: 9.6 mg/dL (ref 8.7–10.3)
Creatinine, Ser: 0.68 mg/dL (ref 0.57–1.00)
GFR calc non Af Amer: 91 mL/min/{1.73_m2} (ref >59)
GFR, EST AFRICAN AMERICAN: 105 mL/min/{1.73_m2} (ref >59)
GLUCOSE: 83 mg/dL (ref 65–99)
Globulin, Total: 2.1 g/dL (ref 1.5–4.5)
Potassium: 4.1 mmol/L (ref 3.5–5.2)
Sodium: 143 mmol/L (ref 134–144)
TOTAL PROTEIN: 6.6 g/dL (ref 6.0–8.5)

## 2017-01-24 LAB — VITAMIN D 25 HYDROXY (VIT D DEFICIENCY, FRACTURES): VIT D 25 HYDROXY: 37 ng/mL (ref 30.0–100.0)

## 2017-03-11 ENCOUNTER — Ambulatory Visit (INDEPENDENT_AMBULATORY_CARE_PROVIDER_SITE_OTHER): Payer: PPO | Admitting: Nurse Practitioner

## 2017-03-11 VITALS — BP 179/89 | HR 93 | Temp 97.0°F | Ht 59.0 in | Wt 115.0 lb

## 2017-03-11 DIAGNOSIS — I10 Essential (primary) hypertension: Secondary | ICD-10-CM | POA: Diagnosis not present

## 2017-03-11 DIAGNOSIS — R002 Palpitations: Secondary | ICD-10-CM | POA: Diagnosis not present

## 2017-03-11 MED ORDER — METOPROLOL SUCCINATE ER 50 MG PO TB24: 50.0000 mg | ORAL_TABLET | Freq: Every day | ORAL | 3 refills | Status: DC

## 2017-03-11 NOTE — Patient Instructions (Signed)
DASH Eating Plan DASH stands for "Dietary Approaches to Stop Hypertension." The DASH eating plan is a healthy eating plan that has been shown to reduce high blood pressure (hypertension). It may also reduce your risk for type 2 diabetes, heart disease, and stroke. The DASH eating plan may also help with weight loss. What are tips for following this plan? General guidelines  Avoid eating more than 2,300 mg (milligrams) of salt (sodium) a day. If you have hypertension, you may need to reduce your sodium intake to 1,500 mg a day.  Limit alcohol intake to no more than 1 drink a day for nonpregnant women and 2 drinks a day for men. One drink equals 12 oz of beer, 5 oz of wine, or 1 oz of hard liquor.  Work with your health care provider to maintain a healthy body weight or to lose weight. Ask what an ideal weight is for you.  Get at least 30 minutes of exercise that causes your heart to beat faster (aerobic exercise) most days of the week. Activities may include walking, swimming, or biking.  Work with your health care provider or diet and nutrition specialist (dietitian) to adjust your eating plan to your individual calorie needs. Reading food labels  Check food labels for the amount of sodium per serving. Choose foods with less than 5 percent of the Daily Value of sodium. Generally, foods with less than 300 mg of sodium per serving fit into this eating plan.  To find whole grains, look for the word "whole" as the first word in the ingredient list. Shopping  Buy products labeled as "low-sodium" or "no salt added."  Buy fresh foods. Avoid canned foods and premade or frozen meals. Cooking  Avoid adding salt when cooking. Use salt-free seasonings or herbs instead of table salt or sea salt. Check with your health care provider or pharmacist before using salt substitutes.  Do not fry foods. Cook foods using healthy methods such as baking, boiling, grilling, and broiling instead.  Cook with  heart-healthy oils, such as olive, canola, soybean, or sunflower oil. Meal planning   Eat a balanced diet that includes: ? 5 or more servings of fruits and vegetables each day. At each meal, try to fill half of your plate with fruits and vegetables. ? Up to 6-8 servings of whole grains each day. ? Less than 6 oz of lean meat, poultry, or fish each day. A 3-oz serving of meat is about the same size as a deck of cards. One egg equals 1 oz. ? 2 servings of low-fat dairy each day. ? A serving of nuts, seeds, or beans 5 times each week. ? Heart-healthy fats. Healthy fats called Omega-3 fatty acids are found in foods such as flaxseeds and coldwater fish, like sardines, salmon, and mackerel.  Limit how much you eat of the following: ? Canned or prepackaged foods. ? Food that is high in trans fat, such as fried foods. ? Food that is high in saturated fat, such as fatty meat. ? Sweets, desserts, sugary drinks, and other foods with added sugar. ? Full-fat dairy products.  Do not salt foods before eating.  Try to eat at least 2 vegetarian meals each week.  Eat more home-cooked food and less restaurant, buffet, and fast food.  When eating at a restaurant, ask that your food be prepared with less salt or no salt, if possible. What foods are recommended? The items listed may not be a complete list. Talk with your dietitian about what   dietary choices are best for you. Grains Whole-grain or whole-wheat bread. Whole-grain or whole-wheat pasta. Brown rice. Oatmeal. Quinoa. Bulgur. Whole-grain and low-sodium cereals. Pita bread. Low-fat, low-sodium crackers. Whole-wheat flour tortillas. Vegetables Fresh or frozen vegetables (raw, steamed, roasted, or grilled). Low-sodium or reduced-sodium tomato and vegetable juice. Low-sodium or reduced-sodium tomato sauce and tomato paste. Low-sodium or reduced-sodium canned vegetables. Fruits All fresh, dried, or frozen fruit. Canned fruit in natural juice (without  added sugar). Meat and other protein foods Skinless chicken or turkey. Ground chicken or turkey. Pork with fat trimmed off. Fish and seafood. Egg whites. Dried beans, peas, or lentils. Unsalted nuts, nut butters, and seeds. Unsalted canned beans. Lean cuts of beef with fat trimmed off. Low-sodium, lean deli meat. Dairy Low-fat (1%) or fat-free (skim) milk. Fat-free, low-fat, or reduced-fat cheeses. Nonfat, low-sodium ricotta or cottage cheese. Low-fat or nonfat yogurt. Low-fat, low-sodium cheese. Fats and oils Soft margarine without trans fats. Vegetable oil. Low-fat, reduced-fat, or light mayonnaise and salad dressings (reduced-sodium). Canola, safflower, olive, soybean, and sunflower oils. Avocado. Seasoning and other foods Herbs. Spices. Seasoning mixes without salt. Unsalted popcorn and pretzels. Fat-free sweets. What foods are not recommended? The items listed may not be a complete list. Talk with your dietitian about what dietary choices are best for you. Grains Baked goods made with fat, such as croissants, muffins, or some breads. Dry pasta or rice meal packs. Vegetables Creamed or fried vegetables. Vegetables in a cheese sauce. Regular canned vegetables (not low-sodium or reduced-sodium). Regular canned tomato sauce and paste (not low-sodium or reduced-sodium). Regular tomato and vegetable juice (not low-sodium or reduced-sodium). Pickles. Olives. Fruits Canned fruit in a light or heavy syrup. Fried fruit. Fruit in cream or butter sauce. Meat and other protein foods Fatty cuts of meat. Ribs. Fried meat. Bacon. Sausage. Bologna and other processed lunch meats. Salami. Fatback. Hotdogs. Bratwurst. Salted nuts and seeds. Canned beans with added salt. Canned or smoked fish. Whole eggs or egg yolks. Chicken or turkey with skin. Dairy Whole or 2% milk, cream, and half-and-half. Whole or full-fat cream cheese. Whole-fat or sweetened yogurt. Full-fat cheese. Nondairy creamers. Whipped toppings.  Processed cheese and cheese spreads. Fats and oils Butter. Stick margarine. Lard. Shortening. Ghee. Bacon fat. Tropical oils, such as coconut, palm kernel, or palm oil. Seasoning and other foods Salted popcorn and pretzels. Onion salt, garlic salt, seasoned salt, table salt, and sea salt. Worcestershire sauce. Tartar sauce. Barbecue sauce. Teriyaki sauce. Soy sauce, including reduced-sodium. Steak sauce. Canned and packaged gravies. Fish sauce. Oyster sauce. Cocktail sauce. Horseradish that you find on the shelf. Ketchup. Mustard. Meat flavorings and tenderizers. Bouillon cubes. Hot sauce and Tabasco sauce. Premade or packaged marinades. Premade or packaged taco seasonings. Relishes. Regular salad dressings. Where to find more information:  National Heart, Lung, and Blood Institute: www.nhlbi.nih.gov  American Heart Association: www.heart.org Summary  The DASH eating plan is a healthy eating plan that has been shown to reduce high blood pressure (hypertension). It may also reduce your risk for type 2 diabetes, heart disease, and stroke.  With the DASH eating plan, you should limit salt (sodium) intake to 2,300 mg a day. If you have hypertension, you may need to reduce your sodium intake to 1,500 mg a day.  When on the DASH eating plan, aim to eat more fresh fruits and vegetables, whole grains, lean proteins, low-fat dairy, and heart-healthy fats.  Work with your health care provider or diet and nutrition specialist (dietitian) to adjust your eating plan to your individual   calorie needs. This information is not intended to replace advice given to you by your health care provider. Make sure you discuss any questions you have with your health care provider. Document Released: 12/09/2010 Document Revised: 12/14/2015 Document Reviewed: 12/14/2015 Elsevier Interactive Patient Education  2018 Elsevier Inc.  

## 2017-03-11 NOTE — Progress Notes (Signed)
   Subjective:    Patient ID: [name], female    DOB: 1950-01-30, 67 y.o.   MRN: [id number]  HPI Patient comes in today c/o elevated blood pressure at home. Running 829-562 systolic at home. Has also been having palpitations. Denies SOB. Denies taking any OTC decongestants  * sometimes at home it has been in 130 systolic.  Review of Systems  Constitutional: Negative.   Respiratory: Negative for shortness of breath.   Cardiovascular: Positive for palpitations. Negative for chest pain and leg swelling.  Gastrointestinal: Negative.   Genitourinary: Negative.   Neurological: Negative for dizziness and headaches.  Psychiatric/Behavioral: Negative.   All other systems reviewed and are negative.      Objective:   Physical Exam  Constitutional: She appears well-developed and well-nourished. No distress.  Cardiovascular: Normal rate.  Pulmonary/Chest: Effort normal.  Skin: Skin is warm.  Psychiatric: She has a normal mood and affect. Her behavior is normal. Judgment and thought content normal.   BP (!) 179/89   Pulse 93   Temp (!) 97 F (36.1 C) (Oral)   Ht 4\' 11"  (1.499 m)   Wt 115 lb (52.2 kg)   BMI 23.23 kg/m   EKG- NSR      Assessment & Plan:   1. Palpitations   2. Essential hypertension    Meds ordered this encounter  Medications  . DISCONTD: metoprolol succinate (TOPROL-XL) 50 MG 24 hr tablet    Sig: Take 1 tablet (50 mg total) by mouth daily. Take with or immediately following a meal.    Dispense:  90 tablet    Refill:  3    Order Specific Question:   Supervising Provider    Answer:   VINCENT, CAROL L [[id number]]  . metoprolol succinate (TOPROL-XL) 50 MG 24 hr tablet    Sig: Take 1 tablet (50 mg total) by mouth daily. Take with or immediately following a meal.    Dispense:  90 tablet    Refill:  3    Order Specific Question:   Supervising Provider    Answer:   Eustaquio Maize [[id number]]   Keep diary of blood pressure RTO in 2 weeks recheck Sooner if not doing  well  Mary-Margaret Hassell Done, FNP

## 2017-03-24 DIAGNOSIS — Z83511 Family history of glaucoma: Secondary | ICD-10-CM | POA: Diagnosis not present

## 2017-03-24 DIAGNOSIS — H2513 Age-related nuclear cataract, bilateral: Secondary | ICD-10-CM | POA: Diagnosis not present

## 2017-03-24 DIAGNOSIS — H40053 Ocular hypertension, bilateral: Secondary | ICD-10-CM | POA: Diagnosis not present

## 2017-03-24 DIAGNOSIS — Z961 Presence of intraocular lens: Secondary | ICD-10-CM | POA: Diagnosis not present

## 2017-03-24 DIAGNOSIS — H40013 Open angle with borderline findings, low risk, bilateral: Secondary | ICD-10-CM | POA: Diagnosis not present

## 2017-03-28 ENCOUNTER — Ambulatory Visit (INDEPENDENT_AMBULATORY_CARE_PROVIDER_SITE_OTHER): Payer: PPO | Admitting: Nurse Practitioner

## 2017-03-28 ENCOUNTER — Encounter: Payer: Self-pay | Admitting: Nurse Practitioner

## 2017-03-28 VITALS — BP 113/69 | HR 61 | Temp 98.2°F | Ht 59.0 in | Wt 114.8 lb

## 2017-03-28 DIAGNOSIS — I10 Essential (primary) hypertension: Secondary | ICD-10-CM | POA: Insufficient documentation

## 2017-03-28 NOTE — Patient Instructions (Signed)
DASH Eating Plan DASH stands for "Dietary Approaches to Stop Hypertension." The DASH eating plan is a healthy eating plan that has been shown to reduce high blood pressure (hypertension). It may also reduce your risk for type 2 diabetes, heart disease, and stroke. The DASH eating plan may also help with weight loss. What are tips for following this plan? General guidelines  Avoid eating more than 2,300 mg (milligrams) of salt (sodium) a day. If you have hypertension, you may need to reduce your sodium intake to 1,500 mg a day.  Limit alcohol intake to no more than 1 drink a day for nonpregnant women and 2 drinks a day for men. One drink equals 12 oz of beer, 5 oz of wine, or 1 oz of hard liquor.  Work with your health care provider to maintain a healthy body weight or to lose weight. Ask what an ideal weight is for you.  Get at least 30 minutes of exercise that causes your heart to beat faster (aerobic exercise) most days of the week. Activities may include walking, swimming, or biking.  Work with your health care provider or diet and nutrition specialist (dietitian) to adjust your eating plan to your individual calorie needs. Reading food labels  Check food labels for the amount of sodium per serving. Choose foods with less than 5 percent of the Daily Value of sodium. Generally, foods with less than 300 mg of sodium per serving fit into this eating plan.  To find whole grains, look for the word "whole" as the first word in the ingredient list. Shopping  Buy products labeled as "low-sodium" or "no salt added."  Buy fresh foods. Avoid canned foods and premade or frozen meals. Cooking  Avoid adding salt when cooking. Use salt-free seasonings or herbs instead of table salt or sea salt. Check with your health care provider or pharmacist before using salt substitutes.  Do not fry foods. Cook foods using healthy methods such as baking, boiling, grilling, and broiling instead.  Cook with  heart-healthy oils, such as olive, canola, soybean, or sunflower oil. Meal planning   Eat a balanced diet that includes: ? 5 or more servings of fruits and vegetables each day. At each meal, try to fill half of your plate with fruits and vegetables. ? Up to 6-8 servings of whole grains each day. ? Less than 6 oz of lean meat, poultry, or fish each day. A 3-oz serving of meat is about the same size as a deck of cards. One egg equals 1 oz. ? 2 servings of low-fat dairy each day. ? A serving of nuts, seeds, or beans 5 times each week. ? Heart-healthy fats. Healthy fats called Omega-3 fatty acids are found in foods such as flaxseeds and coldwater fish, like sardines, salmon, and mackerel.  Limit how much you eat of the following: ? Canned or prepackaged foods. ? Food that is high in trans fat, such as fried foods. ? Food that is high in saturated fat, such as fatty meat. ? Sweets, desserts, sugary drinks, and other foods with added sugar. ? Full-fat dairy products.  Do not salt foods before eating.  Try to eat at least 2 vegetarian meals each week.  Eat more home-cooked food and less restaurant, buffet, and fast food.  When eating at a restaurant, ask that your food be prepared with less salt or no salt, if possible. What foods are recommended? The items listed may not be a complete list. Talk with your dietitian about what   dietary choices are best for you. Grains Whole-grain or whole-wheat bread. Whole-grain or whole-wheat pasta. Brown rice. Oatmeal. Quinoa. Bulgur. Whole-grain and low-sodium cereals. Pita bread. Low-fat, low-sodium crackers. Whole-wheat flour tortillas. Vegetables Fresh or frozen vegetables (raw, steamed, roasted, or grilled). Low-sodium or reduced-sodium tomato and vegetable juice. Low-sodium or reduced-sodium tomato sauce and tomato paste. Low-sodium or reduced-sodium canned vegetables. Fruits All fresh, dried, or frozen fruit. Canned fruit in natural juice (without  added sugar). Meat and other protein foods Skinless chicken or turkey. Ground chicken or turkey. Pork with fat trimmed off. Fish and seafood. Egg whites. Dried beans, peas, or lentils. Unsalted nuts, nut butters, and seeds. Unsalted canned beans. Lean cuts of beef with fat trimmed off. Low-sodium, lean deli meat. Dairy Low-fat (1%) or fat-free (skim) milk. Fat-free, low-fat, or reduced-fat cheeses. Nonfat, low-sodium ricotta or cottage cheese. Low-fat or nonfat yogurt. Low-fat, low-sodium cheese. Fats and oils Soft margarine without trans fats. Vegetable oil. Low-fat, reduced-fat, or light mayonnaise and salad dressings (reduced-sodium). Canola, safflower, olive, soybean, and sunflower oils. Avocado. Seasoning and other foods Herbs. Spices. Seasoning mixes without salt. Unsalted popcorn and pretzels. Fat-free sweets. What foods are not recommended? The items listed may not be a complete list. Talk with your dietitian about what dietary choices are best for you. Grains Baked goods made with fat, such as croissants, muffins, or some breads. Dry pasta or rice meal packs. Vegetables Creamed or fried vegetables. Vegetables in a cheese sauce. Regular canned vegetables (not low-sodium or reduced-sodium). Regular canned tomato sauce and paste (not low-sodium or reduced-sodium). Regular tomato and vegetable juice (not low-sodium or reduced-sodium). Pickles. Olives. Fruits Canned fruit in a light or heavy syrup. Fried fruit. Fruit in cream or butter sauce. Meat and other protein foods Fatty cuts of meat. Ribs. Fried meat. Bacon. Sausage. Bologna and other processed lunch meats. Salami. Fatback. Hotdogs. Bratwurst. Salted nuts and seeds. Canned beans with added salt. Canned or smoked fish. Whole eggs or egg yolks. Chicken or turkey with skin. Dairy Whole or 2% milk, cream, and half-and-half. Whole or full-fat cream cheese. Whole-fat or sweetened yogurt. Full-fat cheese. Nondairy creamers. Whipped toppings.  Processed cheese and cheese spreads. Fats and oils Butter. Stick margarine. Lard. Shortening. Ghee. Bacon fat. Tropical oils, such as coconut, palm kernel, or palm oil. Seasoning and other foods Salted popcorn and pretzels. Onion salt, garlic salt, seasoned salt, table salt, and sea salt. Worcestershire sauce. Tartar sauce. Barbecue sauce. Teriyaki sauce. Soy sauce, including reduced-sodium. Steak sauce. Canned and packaged gravies. Fish sauce. Oyster sauce. Cocktail sauce. Horseradish that you find on the shelf. Ketchup. Mustard. Meat flavorings and tenderizers. Bouillon cubes. Hot sauce and Tabasco sauce. Premade or packaged marinades. Premade or packaged taco seasonings. Relishes. Regular salad dressings. Where to find more information:  National Heart, Lung, and Blood Institute: www.nhlbi.nih.gov  American Heart Association: www.heart.org Summary  The DASH eating plan is a healthy eating plan that has been shown to reduce high blood pressure (hypertension). It may also reduce your risk for type 2 diabetes, heart disease, and stroke.  With the DASH eating plan, you should limit salt (sodium) intake to 2,300 mg a day. If you have hypertension, you may need to reduce your sodium intake to 1,500 mg a day.  When on the DASH eating plan, aim to eat more fresh fruits and vegetables, whole grains, lean proteins, low-fat dairy, and heart-healthy fats.  Work with your health care provider or diet and nutrition specialist (dietitian) to adjust your eating plan to your individual   calorie needs. This information is not intended to replace advice given to you by your health care provider. Make sure you discuss any questions you have with your health care provider. Document Released: 12/09/2010 Document Revised: 12/14/2015 Document Reviewed: 12/14/2015 Elsevier Interactive Patient Education  2018 Elsevier Inc.  

## 2017-03-28 NOTE — Progress Notes (Signed)
   Subjective:    Patient ID: [name], female    DOB: November 03, 1950, 67 y.o.   MRN: [id number]  HPI patient here today for follow up of blood pressure. She was seen 03/11/17 with elevated blood pressure and palpitations. EKG was normal. We started her on metoprolol 50mg  1 daily. She says she is doing well without side effects. SHe checks hr blood pressure daily and has been consistently running below 676 systolic.    Review of Systems  Constitutional: Negative.   HENT: Negative.   Respiratory: Negative.   Cardiovascular: Negative.   Gastrointestinal: Negative.   Genitourinary: Negative.   Neurological: Negative.   Psychiatric/Behavioral: Negative.   All other systems reviewed and are negative.      Objective:   Physical Exam  Constitutional: She appears well-developed and well-nourished. No distress.  Cardiovascular: Normal rate and regular rhythm.  Pulmonary/Chest: Effort normal and breath sounds normal.  Skin: Skin is warm.  Psychiatric: She has a normal mood and affect. Her behavior is normal. Judgment and thought content normal.   BP 113/69   Pulse 61   Temp 98.2 F (36.8 C) (Oral)   Ht 4\' 11"  (1.499 m)   Wt 114 lb 12.8 oz (52.1 kg)   BMI 23.19 kg/m       Assessment & Plan:   1. Essential hypertension    Continue metoprolol as rx Watch salt in diet Follow up for regular follow up as already scheduled  [place], FNP

## 2017-05-17 ENCOUNTER — Other Ambulatory Visit: Payer: Self-pay | Admitting: Physician Assistant

## 2017-05-17 DIAGNOSIS — D229 Melanocytic nevi, unspecified: Secondary | ICD-10-CM | POA: Diagnosis not present

## 2017-05-17 DIAGNOSIS — D2239 Melanocytic nevi of other parts of face: Secondary | ICD-10-CM | POA: Diagnosis not present

## 2017-05-17 DIAGNOSIS — D485 Neoplasm of uncertain behavior of skin: Secondary | ICD-10-CM | POA: Diagnosis not present

## 2017-05-21 ENCOUNTER — Other Ambulatory Visit: Payer: Self-pay | Admitting: Nurse Practitioner

## 2017-05-21 DIAGNOSIS — E785 Hyperlipidemia, unspecified: Secondary | ICD-10-CM

## 2017-05-21 DIAGNOSIS — K219 Gastro-esophageal reflux disease without esophagitis: Secondary | ICD-10-CM

## 2017-06-22 ENCOUNTER — Other Ambulatory Visit: Payer: Self-pay | Admitting: Physician Assistant

## 2017-06-22 DIAGNOSIS — B078 Other viral warts: Secondary | ICD-10-CM | POA: Diagnosis not present

## 2017-06-22 DIAGNOSIS — D485 Neoplasm of uncertain behavior of skin: Secondary | ICD-10-CM | POA: Diagnosis not present

## 2017-07-24 ENCOUNTER — Ambulatory Visit (INDEPENDENT_AMBULATORY_CARE_PROVIDER_SITE_OTHER): Payer: PPO | Admitting: Nurse Practitioner

## 2017-07-24 ENCOUNTER — Ambulatory Visit (INDEPENDENT_AMBULATORY_CARE_PROVIDER_SITE_OTHER): Payer: PPO

## 2017-07-24 ENCOUNTER — Encounter: Payer: Self-pay | Admitting: Nurse Practitioner

## 2017-07-24 VITALS — BP 113/74 | HR 63 | Temp 96.9°F | Ht 59.0 in | Wt 114.0 lb

## 2017-07-24 DIAGNOSIS — K449 Diaphragmatic hernia without obstruction or gangrene: Secondary | ICD-10-CM | POA: Diagnosis not present

## 2017-07-24 DIAGNOSIS — K219 Gastro-esophageal reflux disease without esophagitis: Secondary | ICD-10-CM

## 2017-07-24 DIAGNOSIS — I10 Essential (primary) hypertension: Secondary | ICD-10-CM | POA: Diagnosis not present

## 2017-07-24 DIAGNOSIS — E559 Vitamin D deficiency, unspecified: Secondary | ICD-10-CM | POA: Diagnosis not present

## 2017-07-24 DIAGNOSIS — M1612 Unilateral primary osteoarthritis, left hip: Secondary | ICD-10-CM | POA: Diagnosis not present

## 2017-07-24 DIAGNOSIS — E785 Hyperlipidemia, unspecified: Secondary | ICD-10-CM | POA: Diagnosis not present

## 2017-07-24 DIAGNOSIS — F411 Generalized anxiety disorder: Secondary | ICD-10-CM

## 2017-07-24 DIAGNOSIS — M25552 Pain in left hip: Secondary | ICD-10-CM | POA: Diagnosis not present

## 2017-07-24 LAB — CMP14+EGFR
ALT: 34 IU/L — ABNORMAL HIGH (ref 0–32)
AST: 29 IU/L (ref 0–40)
Albumin/Globulin Ratio: 2 (ref 1.2–2.2)
Albumin: 4.5 g/dL (ref 3.6–4.8)
Alkaline Phosphatase: 77 IU/L (ref 39–117)
BUN/Creatinine Ratio: 22 (ref 12–28)
BUN: 17 mg/dL (ref 8–27)
Bilirubin Total: 0.4 mg/dL (ref 0.0–1.2)
CALCIUM: 9.8 mg/dL (ref 8.7–10.3)
CO2: 25 mmol/L (ref 20–29)
Chloride: 104 mmol/L (ref 96–106)
Creatinine, Ser: 0.78 mg/dL (ref 0.57–1.00)
GFR calc Af Amer: 90 mL/min/{1.73_m2} (ref >59)
GFR, EST NON AFRICAN AMERICAN: 78 mL/min/{1.73_m2} (ref >59)
GLUCOSE: 94 mg/dL (ref 65–99)
Globulin, Total: 2.2 g/dL (ref 1.5–4.5)
Potassium: 4.4 mmol/L (ref 3.5–5.2)
Sodium: 144 mmol/L (ref 134–144)
Total Protein: 6.7 g/dL (ref 6.0–8.5)

## 2017-07-24 LAB — LIPID PANEL
CHOL/HDL RATIO: 3.6 ratio (ref 0.0–4.4)
CHOLESTEROL TOTAL: 176 mg/dL (ref 100–199)
HDL: 49 mg/dL (ref >39)
LDL CALC: 84 mg/dL (ref 0–99)
TRIGLYCERIDES: 215 mg/dL — AB (ref 0–149)
VLDL Cholesterol Cal: 43 mg/dL — ABNORMAL HIGH (ref 5–40)

## 2017-07-24 MED ORDER — ATORVASTATIN CALCIUM 20 MG PO TABS: ORAL_TABLET | ORAL | 1 refills | Status: DC

## 2017-07-24 MED ORDER — LANSOPRAZOLE 30 MG PO CPDR
DELAYED_RELEASE_CAPSULE | ORAL | 0 refills | Status: DC
Start: 2017-07-24 — End: 2017-11-23

## 2017-07-24 MED ORDER — ALPRAZOLAM 0.25 MG PO TABS: 0.2500 mg | ORAL_TABLET | Freq: Every evening | ORAL | 2 refills | Status: DC | PRN

## 2017-07-24 MED ORDER — METOPROLOL SUCCINATE ER 50 MG PO TB24: 50.0000 mg | ORAL_TABLET | Freq: Every day | ORAL | 3 refills | Status: DC

## 2017-07-24 MED ORDER — MELOXICAM 15 MG PO TABS: 15.0000 mg | ORAL_TABLET | Freq: Every day | ORAL | 0 refills | Status: DC

## 2017-07-24 NOTE — Progress Notes (Signed)
Subjective:    Patient ID: [name], female    DOB: 03/12/1950, 67 y.o.   MRN: [id number]   Chief Complaint: Medical Management of chronic issues  HPI:  1. Essential hypertension  No c/o chest pain, sob or headache. Does not check blood pressure at home. BP Readings from Last 3 Encounters:  07/24/17 113/74  03/28/17 113/69  03/11/17 (!) 179/89     2. Diaphragmatic hernia without obstruction and without gangrene  Says it has not bothered hr in awhile.  3. Gastroesophageal reflux disease without esophagitis  Takes prevacid which works well.  4. Vitamin D deficiency  Takes vitamin d and calcium supplement daily  5. Hyperlipidemia with target LDL less than 100  Watches diet- avoids fried foods most days.    Outpatient Encounter Medications as of 07/24/2017  Medication Sig  . ALPRAZolam (XANAX) 0.25 MG tablet Take 1 tablet (0.25 mg total) by mouth at bedtime as needed.  Marland Kitchen aspirin 81 MG chewable tablet Chew by mouth daily.  Marland Kitchen atorvastatin (LIPITOR) 20 MG tablet TAKE 1 TABLET DAILY AT 6PM  . BIOTIN PO Take by mouth.  Marland Kitchen CALCIUM-MAGNESIUM PO Take by mouth.    . Cranberry 400 MG CAPS Take by mouth.  . Cyanocobalamin (VITAMIN B-12 PO) Take by mouth.    . fluticasone (FLONASE) 50 MCG/ACT nasal spray Place 2 sprays into both nostrils daily.  . lansoprazole (PREVACID) 30 MG capsule TAKE ONE (1) CAPSULE EACH DAY  . LUMIGAN 0.01 % SOLN   . metoprolol succinate (TOPROL-XL) 50 MG 24 hr tablet Take 1 tablet (50 mg total) by mouth daily. Take with or immediately following a meal.  . Pyridoxine HCl (VITAMIN B6 PO) Take by mouth.    . trimethoprim-polymyxin b (POLYTRIM) ophthalmic solution Place 1 drop into the left eye 4 (four) times daily.       New complaints: Left hip pain. Worse at night when laying down. Has to take tylenol nightly and still rates pain 8/10. Doe snot really  bother her much during th eday tome.  Social history: husband still living. They both stay very  active.    Review of Systems  Constitutional: Negative for activity change and appetite change.  HENT: Negative.   Eyes: Negative for pain.  Respiratory: Negative for shortness of breath.   Cardiovascular: Negative for chest pain, palpitations and leg swelling.  Gastrointestinal: Negative for abdominal pain.  Endocrine: Negative for polydipsia.  Genitourinary: Negative.   Musculoskeletal: Positive for arthralgias (left hip).  Skin: Negative for rash.  Neurological: Negative for dizziness, weakness and headaches.  Hematological: Does not bruise/bleed easily.  Psychiatric/Behavioral: Negative.   All other systems reviewed and are negative.      Objective:   Physical Exam  Constitutional: She is oriented to person, place, and time.  HENT:  Head: Normocephalic.  Nose: Nose normal.  Mouth/Throat: Oropharynx is clear and moist.  Eyes: Pupils are equal, round, and reactive to light. EOM are normal.  Neck: Normal range of motion. Neck supple. No JVD present. Carotid bruit is not present.  Cardiovascular: Normal rate, regular rhythm, normal heart sounds and intact distal pulses.  Pulmonary/Chest: Effort normal and breath sounds normal. No respiratory distress. She has no wheezes. She has no rales. She exhibits no tenderness.  Abdominal: Soft. Normal appearance, normal aorta and bowel sounds are normal. She exhibits no distension, no abdominal bruit, no pulsatile midline mass and no mass. There is no splenomegaly or hepatomegaly. There is no tenderness.  Musculoskeletal: She exhibits  no edema.  Point tenderness of left hip  FROM without pain  Lymphadenopathy:    She has no cervical adenopathy.  Neurological: She is alert and oriented to person, place, and time. She has normal reflexes.  Skin: Skin is warm and dry.  Psychiatric: She has a normal mood and affect. Her behavior is normal. Judgment and thought content normal.   BP 113/74   Pulse 63   Temp (!) 96.9 F (36.1 C) (Oral)    Ht 4' 11" (1.499 m)   Wt 114 lb (51.7 kg)   BMI 23.03 kg/m   Left hip xray- mild osteoarthritis-Preliminary reading by Ronnald Collum, FNP  [facility]       Assessment & Plan:  [name] comes in today with chief complaint of Medical Management of Chronic Issues   Diagnosis and orders addressed:  1. Essential hypertension Low sodium diet - metoprolol succinate (TOPROL-XL) 50 MG 24 hr tablet; Take 1 tablet (50 mg total) by mouth daily. Take with or immediately following a meal.  Dispense: 90 tablet; Refill: 3 - CMP14+EGFR  2. Diaphragmatic hernia without obstruction and without gangrene Avoid over eating  3. Gastroesophageal reflux disease without esophagitis Avoid spicy foods Do not eat 2 hours prior to bedtime - lansoprazole (PREVACID) 30 MG capsule; TAKE ONE (1) CAPSULE EACH DAY  Dispense: 90 capsule; Refill: 0  4. Vitamin D deficiency Continue daily vitamin d  5. Hyperlipidemia with target LDL less than 100 Low fat diet - atorvastatin (LIPITOR) 20 MG tablet; TAKE 1 TABLET DAILY AT 6PM  Dispense: 90 tablet; Refill: 1 - Lipid panel  6. Primary osteoarthritis of left hip Added mobic to meds - DG HIP UNILAT W OR W/O PELVIS 2-3 VIEWS LEFT; Future - meloxicam (MOBIC) 15 MG tablet; Take 1 tablet (15 mg total) by mouth daily.  Dispense: 30 tablet; Refill: 0  7. GAD (generalized anxiety disorder) stress management - ALPRAZolam (XANAX) 0.25 MG tablet; Take 1 tablet (0.25 mg total) by mouth at bedtime as needed.  Dispense: 30 tablet; Refill: 2   Labs pending Health Maintenance reviewed Diet and exercise encouraged  Follow up plan: 6 months   Mary-Margaret Hassell Done, FNP

## 2017-07-24 NOTE — Addendum Note (Signed)
Addended by: Chevis Pretty on: 07/24/2017 11:15 AM   Modules accepted: Orders

## 2017-07-24 NOTE — Patient Instructions (Signed)

## 2017-08-21 ENCOUNTER — Other Ambulatory Visit: Payer: Self-pay | Admitting: Nurse Practitioner

## 2017-08-21 DIAGNOSIS — M1612 Unilateral primary osteoarthritis, left hip: Secondary | ICD-10-CM

## 2017-09-20 ENCOUNTER — Other Ambulatory Visit: Payer: Self-pay | Admitting: Nurse Practitioner

## 2017-09-20 DIAGNOSIS — M1612 Unilateral primary osteoarthritis, left hip: Secondary | ICD-10-CM

## 2017-10-02 ENCOUNTER — Other Ambulatory Visit: Payer: Self-pay | Admitting: Gynecology

## 2017-10-02 DIAGNOSIS — Z1231 Encounter for screening mammogram for malignant neoplasm of breast: Secondary | ICD-10-CM

## 2017-10-09 ENCOUNTER — Ambulatory Visit (INDEPENDENT_AMBULATORY_CARE_PROVIDER_SITE_OTHER): Payer: PPO

## 2017-10-09 DIAGNOSIS — Z23 Encounter for immunization: Secondary | ICD-10-CM

## 2017-10-21 ENCOUNTER — Other Ambulatory Visit: Payer: Self-pay | Admitting: Nurse Practitioner

## 2017-10-21 DIAGNOSIS — M1612 Unilateral primary osteoarthritis, left hip: Secondary | ICD-10-CM

## 2017-10-23 NOTE — Telephone Encounter (Signed)
OV 07/24/17 rtc 6 mos

## 2017-10-26 ENCOUNTER — Encounter: Payer: PPO | Admitting: *Deleted

## 2017-11-15 ENCOUNTER — Ambulatory Visit (INDEPENDENT_AMBULATORY_CARE_PROVIDER_SITE_OTHER): Payer: PPO

## 2017-11-15 VITALS — BP 136/88 | HR 61 | Temp 97.1°F | Ht 59.0 in | Wt 118.0 lb

## 2017-11-15 DIAGNOSIS — Z0000 Encounter for general adult medical examination without abnormal findings: Secondary | ICD-10-CM

## 2017-11-15 NOTE — Patient Instructions (Signed)
  [name] , Thank you for taking time to come for your Medicare Wellness Visit. I appreciate your ongoing commitment to your health goals. Please review the following plan we discussed and let me know if I can assist you in the future.   These are the goals we discussed: Goals    . DIET - EAT MORE FRUITS AND VEGETABLES    . Exercise 150 minutes per week (moderate activity)       This is a list of the screening recommended for you and due dates:  Health Maintenance  Topic Date Due  . Mammogram  11/16/2018  . Colon Cancer Screening  10/22/2019  . Tetanus Vaccine  05/13/2021  . Flu Shot  Completed  . DEXA scan (bone density measurement)  Completed  .  Hepatitis C: One time screening is recommended by Center for Disease Control  (CDC) for  adults born from [birth year] through 1966.   Completed  . Pneumonia vaccines  Completed

## 2017-11-15 NOTE — Progress Notes (Addendum)
Subjective:   [name] is a 67 y.o. female who presents for Medicare Annual (Subsequent) preventive examination. She is a very pleasant lady that lives locally in [place]. She currently lives with her husband Ludwig Clarks and her 44 year old granddaughter stays with them most of the time because her mother is working and is a single mom. The grand daughter has a job with [employer]. They also have a 15 year old grandson but he does not live with them. She is retired from [employer] and has been for 2 years. She worked there 65 years. She enjoys decorating for holidays, riding bikes on trails, and shopping for antiques. She has a dog named Designer, fashion/clothing and exercises at the recreation center doing senior yoga when she can. She also walks when she can. She attend church on a regular basis and is a member of [employer]. It is a women's group of several different denominations that meet once a month for a meal and bible study. They have guest speakers from time to time and have helped with canned food donations and also with the [place]. They have no stairs in their home. It is a ranch style. Her and her husband are currently working with their lawyer on an Advanced directive. All of her health maintenance is up to date. She only needs a shingrix vaccine but due to her insurance she can't receive that here at our office. Advised patient she will have to have this done at a local pharmacy. Patient verbalized understanding.   Review of Systems:   Cardiac Risk Factors include: advanced age (>34men, >14 women);dyslipidemia;hypertension     Objective:     Vitals: BP 136/88   Pulse 61   Temp (!) 97.1 F (36.2 C) (Oral)   Ht 4\' 11"  (1.499 m)   Wt 118 lb (53.5 kg)   BMI 23.83 kg/m   Body mass index is 23.83 kg/m.  Advanced Directives 11/15/2017 10/19/2016  Does Patient Have a Medical Advance Directive? No No  Would patient like information on creating a medical advance  directive? No - Patient declined Yes (MAU/Ambulatory/Procedural Areas - Information given)   Patient and her husband are currently working with their lawyer to have this taken care of.   Tobacco Social History   Tobacco Use  Smoking Status Never Smoker  Smokeless Tobacco Never Used     Patient has never been a smoker  Clinical Intake:  Pre-visit preparation completed: No  Pain : No/denies pain     BMI - recorded: 23.03 Nutritional Status: BMI of 19-24  Normal Nutritional Risks: None Diabetes: No  How often do you need to have someone help you when you read instructions, pamphlets, or other written materials from your doctor or pharmacy?: 1 - Never What is the last grade level you completed in school?: 12 grade with 2 years of college  Interpreter Needed?: No  Information entered by :: Theodoro Clock LPN  Past Medical History:  Diagnosis Date  . Allergic rhinitis   . Cerumen impaction    right   . Chest discomfort   . Chronic constipation   . Diverticulosis   . GERD (gastroesophageal reflux disease)   . Glaucoma   . Hyperlipidemia   . Kidney stones   . Nasal congestion   . Osteopenia 12/2015   T score -1.9 FRAX 9.9%/1.5% stable from prior DEXA  . Palpitation   . PUD (peptic ulcer disease)   . Scoliosis   .  Sinusitis   . Vitamin D deficiency    Past Surgical History:  Procedure Laterality Date  . CARDIAC CATHETERIZATION    . COLPOSCOPY    . excision of mole  2007  . GYNECOLOGIC CRYOSURGERY    . LAPAROSCOPIC CHOLECYSTECTOMY    . TUBAL LIGATION  1991   Family History  Problem Relation Age of Onset  . Hypertension Mother   . Heart disease Mother   . Hyperlipidemia Mother   . Glaucoma Mother   . Hypertension Father   . Cancer Father        LUNG  . Hyperlipidemia Father   . Diabetes Sister   . Hypertension Sister   . Hyperlipidemia Sister   . Fibromyalgia Daughter   . Asthma Daughter    Social History   Socioeconomic History  . Marital status:  Married    Spouse name: Not on file  . Number of children: Not on file  . Years of education: Not on file  . Highest education level: Associate degree: occupational, Hotel manager, or vocational program  Occupational History  . Occupation: Retired    Fish farm manager: [employer]  Social Needs  . Financial resource strain: Not hard at all  . Food insecurity:    Worry: Never true    Inability: Never true  . Transportation needs:    Medical: No    Non-medical: No  Tobacco Use  . Smoking status: Never Smoker  . Smokeless tobacco: Never Used  Substance and Sexual Activity  . Alcohol use: Yes    Alcohol/week: 0.0 standard drinks    Comment: rare  . Drug use: No  . Sexual activity: Yes    Birth control/protection: Post-menopausal, Surgical    Comment: Tubal lig-1st intercourse 67 yo-Fewer than 5 partners  Lifestyle  . Physical activity:    Days per week: 2 days    Minutes per session: 30 min  . Stress: Not at all  Relationships  . Social connections:    Talks on phone: More than three times a week    Gets together: Three times a week    Attends religious service: More than 4 times per year    Active member of club or organization: Yes    Attends meetings of clubs or organizations: More than 4 times per year    Relationship status: Married  Other Topics Concern  . Not on file  Social History Narrative  . Not on file    Outpatient Encounter Medications as of 11/15/2017  Medication Sig  . ALPRAZolam (XANAX) 0.25 MG tablet Take 1 tablet (0.25 mg total) by mouth at bedtime as needed.  Marland Kitchen aspirin 81 MG chewable tablet Chew by mouth daily.  Marland Kitchen atorvastatin (LIPITOR) 20 MG tablet TAKE 1 TABLET DAILY AT 6PM  . BIOTIN PO Take by mouth.  Marland Kitchen CALCIUM-MAGNESIUM PO Take by mouth.    . Cranberry 400 MG CAPS Take by mouth.  . Cyanocobalamin (VITAMIN B-12 PO) Take by mouth.    . fluticasone (FLONASE) 50 MCG/ACT nasal spray Place 2 sprays into both nostrils daily.  . lansoprazole (PREVACID) 30 MG  capsule TAKE ONE (1) CAPSULE EACH DAY  . LUMIGAN 0.01 % SOLN   . meloxicam (MOBIC) 15 MG tablet TAKE ONE (1) TABLET EACH DAY  . metoprolol succinate (TOPROL-XL) 50 MG 24 hr tablet Take 1 tablet (50 mg total) by mouth daily. Take with or immediately following a meal.  . Pyridoxine HCl (VITAMIN B6 PO) Take by mouth.    . trimethoprim-polymyxin b (  POLYTRIM) ophthalmic solution Place 1 drop into the left eye 4 (four) times daily.   No facility-administered encounter medications on file as of 11/15/2017.     Activities of Daily Living In your present state of health, do you have any difficulty performing the following activities: 11/15/2017  Hearing? Y  Comment Slight difficultly  Vision? Y  Comment Wears 1 contact and glasses  Difficulty concentrating or making decisions? N  Walking or climbing stairs? N  Dressing or bathing? N  Doing errands, shopping? N  Preparing Food and eating ? N  Using the Toilet? N  In the past six months, have you accidently leaked urine? N  Do you have problems with loss of bowel control? N  Managing your Medications? N  Managing your Finances? N  Housekeeping or managing your Housekeeping? N  Some recent data might be hidden    Patient Care Team: Chevis Pretty, FNP as PCP - General (Family Medicine)    Assessment:   This is a routine wellness examination for [name].  Exercise Activities and Dietary recommendations Current Exercise Habits: The patient does not participate in regular exercise at present, Exercise limited by: None identified  Patient used to walk and bike on trails on a regular basis but hasn't done this in a couple years. She still attends senior yoga at the rec when she can.   Goals    . DIET - EAT MORE FRUITS AND VEGETABLES    . Exercise 150 minutes per week (moderate activity)       Fall Risk Fall Risk  11/15/2017 07/24/2017 03/28/2017 03/11/2017 01/23/2017  Falls in the past year? 0 Yes No No No  Number falls in past yr: -  1 - - -  Injury with Fall? - No - - -  Follow up - - - - -   Is the patient's home free of loose throw rugs in walkways, pet beds, electrical cords, etc?   yes      Grab bars in the bathroom? no      Handrails on the stairs?   yes      Adequate lighting?   yes    Depression Screen PHQ 2/9 Scores 11/15/2017 07/24/2017 03/28/2017 03/11/2017  PHQ - 2 Score 0 0 0 0     Cognitive Function MMSE - Mini Mental State Exam 11/15/2017 10/19/2016  Orientation to time 5 5  Orientation to Place 5 5  Registration 3 3  Attention/ Calculation 5 5  Recall 3 3  Language- name 2 objects 2 2  Language- repeat 1 1  Language- follow 3 step command 3 3  Language- read & follow direction 1 1  Write a sentence 1 1  Copy design 1 1  Total score 30 30    Patient had no problem completing the MMSE    Immunization History  Administered Date(s) Administered  . Influenza, High Dose Seasonal PF 10/19/2016, 10/09/2017  . Influenza,inj,Quad PF,6+ Mos 10/14/2015  . Influenza-Unspecified 11/18/2014  . Pneumococcal Conjugate-13 07/11/2014  . Pneumococcal Polysaccharide-23 07/21/2015  . Tdap 05/14/2011  . Zoster 04/04/2011    Qualifies for Shingles Vaccine? Yes but due to patients insurance we are unable to give her the vaccine here at the office so she was instructed to go to a local pharmacy for this.   Screening Tests Health Maintenance  Topic Date Due  . MAMMOGRAM  11/16/2018  . COLONOSCOPY  10/22/2019  . TETANUS/TDAP  05/13/2021  . INFLUENZA VACCINE  Completed  . DEXA SCAN  Completed  . Hepatitis C Screening  Completed  . PNA vac Low Risk Adult  Completed    Cancer Screenings: Lung: Low Dose CT Chest recommended if Age 23-80 years, 30 pack-year currently smoking OR have quit w/in 15years. Patient does not qualify. Breast:  Up to date on Mammogram? Yes   Up to date of Bone Density/Dexa? Yes Colorectal: Done on 10/21/2009  Additional Screenings:  Hepatitis C Screening: Done on 12/10/2015      Plan:     I have personally reviewed and noted the following in the patient's chart:   . Medical and social history . Use of alcohol, tobacco or illicit drugs  . Current medications and supplements . Functional ability and status . Nutritional status . Physical activity . Advanced directives . List of other physicians . Hospitalizations, surgeries, and ER visits in previous 12 months . Vitals . Screenings to include cognitive, depression, and falls . Referrals and appointments  In addition, I have reviewed and discussed with patient certain preventive protocols, quality metrics, and best practice recommendations. A written personalized care plan for preventive services as well as general preventive health recommendations were provided to patient.     Rolena Infante, LPN  51/88/4166  I have reviewed and agree with the above AWV documentation.   Assunta Found, MD [facility] 11/17/2017, 4:57 PM

## 2017-11-16 ENCOUNTER — Ambulatory Visit
Admission: RE | Admit: 2017-11-16 | Discharge: 2017-11-16 | Disposition: A | Discharge status: Home / Self Care | Payer: PPO | Source: Ambulatory Visit | Attending: Gynecology | Admitting: Gynecology

## 2017-11-16 ENCOUNTER — Ambulatory Visit (INDEPENDENT_AMBULATORY_CARE_PROVIDER_SITE_OTHER): Payer: PPO | Admitting: Gynecology

## 2017-11-16 ENCOUNTER — Encounter: Payer: Self-pay | Admitting: Gynecology

## 2017-11-16 VITALS — BP 124/80 | Ht 59.0 in | Wt 118.0 lb

## 2017-11-16 DIAGNOSIS — Z1231 Encounter for screening mammogram for malignant neoplasm of breast: Secondary | ICD-10-CM

## 2017-11-16 DIAGNOSIS — Z124 Encounter for screening for malignant neoplasm of cervix: Secondary | ICD-10-CM

## 2017-11-16 DIAGNOSIS — Z01419 Encounter for gynecological examination (general) (routine) without abnormal findings: Secondary | ICD-10-CM | POA: Diagnosis not present

## 2017-11-16 DIAGNOSIS — N941 Unspecified dyspareunia: Secondary | ICD-10-CM

## 2017-11-16 DIAGNOSIS — N952 Postmenopausal atrophic vaginitis: Secondary | ICD-10-CM

## 2017-11-16 DIAGNOSIS — M8580 Other specified disorders of bone density and structure, unspecified site: Secondary | ICD-10-CM

## 2017-11-16 NOTE — Progress Notes (Signed)
    [name] 01-07-1950 [id number]        67 y.o.  G1P1001 for breast and pelvic exam.  Having some vaginal dryness and irritation with intercourse.  Not having daily dryness or irritation.  No discharge or odor.  Has not tried anything for this.  Past medical history,surgical history, problem list, medications, allergies, family history and social history were all reviewed and documented as reviewed in the EPIC chart.  ROS:  Performed with pertinent positives and negatives included in the history, assessment and plan.   Additional significant findings : None   Exam: [name] assistant Vitals:   11/16/17 0831  BP: 124/80  Weight: 118 lb (53.5 kg)  Height: 4\' 11"  (1.499 m)   Body mass index is 23.83 kg/m.  General appearance:  Normal affect, orientation and appearance. Skin: Grossly normal HEENT: Without gross lesions.  No cervical or supraclavicular adenopathy. Thyroid normal.  Lungs:  Clear without wheezing, rales or rhonchi Cardiac: RR, without RMG Abdominal:  Soft, nontender, without masses, guarding, rebound, organomegaly or hernia Breasts:  Examined lying and sitting without masses, retractions, discharge or axillary adenopathy. Pelvic:  Ext, BUS, Vagina: With atrophic changes  Cervix: With atrophic changes.  Pap smear done  Uterus: Anteverted., normal size, shape and contour, midline and mobile nontender   Adnexa: Without masses or tenderness    Anus and perineum: Normal   Rectovaginal: Normal sphincter tone without palpated masses or tenderness.    Assessment/Plan:  67 y.o. G58P1001 female for breast and pelvic exam.   1. Postmenopausal/atrophic genital changes/dyspareunia.  Having some dyspareunia consistent with atrophic changes.  Options for management reviewed to include OTC products up to and including vaginal estrogen.  As it only is with intercourse she wants to start with OTC lubricants.  She will follow-up if it continues to be an issue and we want to  rediscuss vaginal estrogen. 2. Osteopenia.  DEXA 2017 T score -1.9.  FRAX 9.9% / 1.5%.  Stable from prior DEXA.  Recommend follow-up DEXA beginning of next year and she will schedule. 3. Mammography today.  Breast exam normal today. 4. Colonoscopy 2011.  Repeat at their recommended interval. 5. Pap smear 2016.  Pap smear done today.  History of cryosurgery number of years ago with normal Pap smears since.  Options to stop screening per current screening guidelines based on age discussed.  Will readdress on an annual basis. 6. Health maintenance.  No routine lab work done as patient does this elsewhere.  Follow-up for bone density otherwise follow-up in 1 year for annual exam.   Anastasio Auerbach MD, 8:50 AM 11/16/2017

## 2017-11-16 NOTE — Patient Instructions (Signed)
Followup for bone density as scheduled. 

## 2017-11-16 NOTE — Addendum Note (Signed)
Addended by: Nelva Nay on: 11/16/2017 09:04 AM   Modules accepted: Orders

## 2017-11-17 LAB — PAP IG W/ RFLX HPV ASCU

## 2017-11-23 ENCOUNTER — Other Ambulatory Visit: Payer: Self-pay | Admitting: Nurse Practitioner

## 2017-11-23 DIAGNOSIS — K219 Gastro-esophageal reflux disease without esophagitis: Secondary | ICD-10-CM

## 2017-12-12 ENCOUNTER — Encounter: Payer: Self-pay | Admitting: Family Medicine

## 2017-12-12 ENCOUNTER — Ambulatory Visit (INDEPENDENT_AMBULATORY_CARE_PROVIDER_SITE_OTHER): Payer: PPO | Admitting: Family Medicine

## 2017-12-12 VITALS — BP 129/83 | HR 73 | Temp 98.9°F | Ht 59.0 in | Wt 119.0 lb

## 2017-12-12 DIAGNOSIS — J0110 Acute frontal sinusitis, unspecified: Secondary | ICD-10-CM

## 2017-12-12 DIAGNOSIS — J040 Acute laryngitis: Secondary | ICD-10-CM | POA: Diagnosis not present

## 2017-12-12 DIAGNOSIS — R300 Dysuria: Secondary | ICD-10-CM | POA: Diagnosis not present

## 2017-12-12 MED ORDER — CEFDINIR 300 MG PO CAPS: 300.0000 mg | ORAL_CAPSULE | Freq: Two times a day (BID) | ORAL | 0 refills | Status: DC

## 2017-12-12 NOTE — Progress Notes (Signed)
BP 129/83   Pulse 73   Temp 98.9 F (37.2 C) (Oral)   Ht 4\' 11"  (1.499 m)   Wt 119 lb (54 kg)   BMI 24.04 kg/m    Subjective:    Patient ID: [name], female    DOB: [birth year], 67 y.o.   MRN: [id number]  HPI: [name] is a 67 y.o. female presenting on 12/12/2017 for Sinus congestion, drainage, pain, headache, cough (x 1 week)   HPI Sinus congestion and cough and drainage and sore throat Patient comes in complaining of cough and congestion and sinus drainage is been going on for about a week and she was treating it with Mucinex and it did seem to be improving and then she developed a loss of her voice and more of a sore throat over the past couple days and that is what brought her in today.  She denies any fevers or chills or shortness of breath or wheezing.  She does have Flonase but has not been using it.  She denies any sick contacts that she knows of.  Again she felt like it was improving and then it got worse over the past couple days.  Relevant past medical, surgical, family and social history reviewed and updated as indicated. Interim medical history since our last visit reviewed. Allergies and medications reviewed and updated.  Review of Systems  Constitutional: Negative for chills and fever.  HENT: Positive for congestion, postnasal drip, rhinorrhea, sinus pressure, sneezing and sore throat. Negative for ear discharge and ear pain.   Eyes: Negative for pain, redness and visual disturbance.  Respiratory: Positive for cough. Negative for chest tightness and shortness of breath.   Cardiovascular: Negative for chest pain and leg swelling.  Genitourinary: Positive for dysuria. Negative for frequency, hematuria and urgency.  Musculoskeletal: Negative for back pain and gait problem.  Skin: Negative for rash.  Neurological: Negative for light-headedness and headaches.  Psychiatric/Behavioral: Negative for agitation and behavioral problems.  All other systems reviewed and  are negative.   Per HPI unless specifically indicated above   Allergies as of 12/12/2017      Reactions   Codeine    Sulfonamide Derivatives    REACTION: severe GI upset      Medication List        Accurate as of 12/12/17  6:17 PM. Always use your most recent med list.          ALPRAZolam 0.25 MG tablet Commonly known as:  XANAX Take 1 tablet (0.25 mg total) by mouth at bedtime as needed.   aspirin 81 MG chewable tablet Chew by mouth daily.   atorvastatin 20 MG tablet Commonly known as:  LIPITOR TAKE 1 TABLET DAILY AT 6PM   BIOTIN PO Take by mouth.   CALCIUM-MAGNESIUM PO Take by mouth.   cefdinir 300 MG capsule Commonly known as:  OMNICEF Take 1 capsule (300 mg total) by mouth 2 (two) times daily. 1 po BID   Cranberry 400 MG Caps Take by mouth.   fluticasone 50 MCG/ACT nasal spray Commonly known as:  FLONASE Place 2 sprays into both nostrils daily.   lansoprazole 30 MG capsule Commonly known as:  PREVACID TAKE ONE (1) CAPSULE EACH DAY   LUMIGAN 0.01 % Soln Generic drug:  bimatoprost   meloxicam 15 MG tablet Commonly known as:  MOBIC TAKE ONE (1) TABLET EACH DAY   metoprolol succinate 50 MG 24 hr tablet Commonly known as:  TOPROL-XL Take 1 tablet (50 mg  total) by mouth daily. Take with or immediately following a meal.   VITAMIN B-12 PO Take by mouth.          Objective:    BP 129/83   Pulse 73   Temp 98.9 F (37.2 C) (Oral)   Ht 4\' 11"  (1.499 m)   Wt 119 lb (54 kg)   BMI 24.04 kg/m   Wt Readings from Last 3 Encounters:  12/12/17 119 lb (54 kg)  11/16/17 118 lb (53.5 kg)  11/15/17 118 lb (53.5 kg)    Physical Exam  Constitutional: She is oriented to person, place, and time. She appears well-developed and well-nourished. No distress.  HENT:  Right Ear: Tympanic membrane, external ear and ear canal normal.  Left Ear: Tympanic membrane, external ear and ear canal normal.  Nose: Mucosal edema present. No rhinorrhea. No epistaxis.  Right sinus exhibits frontal sinus tenderness. Right sinus exhibits no maxillary sinus tenderness. Left sinus exhibits frontal sinus tenderness. Left sinus exhibits no maxillary sinus tenderness.  Mouth/Throat: Uvula is midline and mucous membranes are normal. Posterior oropharyngeal edema and posterior oropharyngeal erythema present. No oropharyngeal exudate or tonsillar abscesses.  Eyes: Conjunctivae and EOM are normal.  Cardiovascular: Normal rate, regular rhythm, normal heart sounds and intact distal pulses.  No murmur heard. Pulmonary/Chest: Effort normal and breath sounds normal. No respiratory distress. She has no wheezes.  Musculoskeletal: Normal range of motion. She exhibits no edema or tenderness.  Neurological: She is alert and oriented to person, place, and time. Coordination normal.  Skin: Skin is warm and dry. No rash noted. She is not diaphoretic.  Psychiatric: She has a normal mood and affect. Her behavior is normal.  Vitals reviewed.     Assessment & Plan:   Problem List Items Addressed This Visit    None    Visit Diagnoses    Acute non-recurrent frontal sinusitis    -  Primary   Relevant Medications   cefdinir (OMNICEF) 300 MG capsule   Dysuria       Wanted refill of Pyridium   Relevant Medications   cefdinir (OMNICEF) 300 MG capsule   Laryngitis       Relevant Medications   cefdinir (OMNICEF) 300 MG capsule       Follow up plan: Return if symptoms worsen or fail to improve.  Counseling provided for all of the vaccine components No orders of the defined types were placed in this encounter.   Caryl Pina, MD [facility] 12/12/2017, 6:17 PM

## 2017-12-21 ENCOUNTER — Other Ambulatory Visit: Payer: Self-pay | Admitting: Nurse Practitioner

## 2017-12-21 DIAGNOSIS — M1612 Unilateral primary osteoarthritis, left hip: Secondary | ICD-10-CM

## 2018-01-31 ENCOUNTER — Ambulatory Visit: Payer: PPO | Admitting: Nurse Practitioner

## 2018-02-11 ENCOUNTER — Other Ambulatory Visit: Payer: Self-pay | Admitting: Nurse Practitioner

## 2018-02-11 DIAGNOSIS — E785 Hyperlipidemia, unspecified: Secondary | ICD-10-CM

## 2018-02-11 DIAGNOSIS — K219 Gastro-esophageal reflux disease without esophagitis: Secondary | ICD-10-CM

## 2018-02-23 ENCOUNTER — Ambulatory Visit: Payer: PPO | Admitting: Nurse Practitioner

## 2018-03-08 ENCOUNTER — Ambulatory Visit (INDEPENDENT_AMBULATORY_CARE_PROVIDER_SITE_OTHER): Payer: PPO | Admitting: Nurse Practitioner

## 2018-03-08 ENCOUNTER — Encounter: Payer: Self-pay | Admitting: Nurse Practitioner

## 2018-03-08 VITALS — BP 143/79 | HR 66 | Temp 97.5°F | Ht 59.0 in | Wt 120.0 lb

## 2018-03-08 DIAGNOSIS — M8580 Other specified disorders of bone density and structure, unspecified site: Secondary | ICD-10-CM

## 2018-03-08 DIAGNOSIS — I10 Essential (primary) hypertension: Secondary | ICD-10-CM

## 2018-03-08 DIAGNOSIS — E559 Vitamin D deficiency, unspecified: Secondary | ICD-10-CM

## 2018-03-08 DIAGNOSIS — K581 Irritable bowel syndrome with constipation: Secondary | ICD-10-CM | POA: Diagnosis not present

## 2018-03-08 DIAGNOSIS — F411 Generalized anxiety disorder: Secondary | ICD-10-CM | POA: Diagnosis not present

## 2018-03-08 DIAGNOSIS — K219 Gastro-esophageal reflux disease without esophagitis: Secondary | ICD-10-CM

## 2018-03-08 DIAGNOSIS — E785 Hyperlipidemia, unspecified: Secondary | ICD-10-CM

## 2018-03-08 MED ORDER — ATORVASTATIN CALCIUM 20 MG PO TABS
20.0000 mg | ORAL_TABLET | Freq: Every day | ORAL | 1 refills | Status: DC
Start: 2018-03-08 — End: 2018-09-11

## 2018-03-08 MED ORDER — METOPROLOL SUCCINATE ER 50 MG PO TB24: 50.0000 mg | ORAL_TABLET | Freq: Every day | ORAL | 1 refills | Status: DC

## 2018-03-08 MED ORDER — ALPRAZOLAM 0.25 MG PO TABS: 0.2500 mg | ORAL_TABLET | Freq: Every evening | ORAL | 5 refills | Status: DC | PRN

## 2018-03-08 MED ORDER — LANSOPRAZOLE 30 MG PO CPDR: 30.0000 mg | DELAYED_RELEASE_CAPSULE | Freq: Every day | ORAL | 1 refills | Status: DC

## 2018-03-08 NOTE — Patient Instructions (Signed)

## 2018-03-08 NOTE — Progress Notes (Signed)
Subjective:    Patient ID: [name], female    DOB: [birth year], 68 y.o.   MRN: [id number]   Chief Complaint: Medical Management of Chronic Issues (congestion and drainage)   HPI:  1. Essential hypertension  No  C/o chest pain, sob or headache. Does not check blood pressure at home. BP Readings from Last 3 Encounters:  03/08/18 (!) 143/79  12/12/17 129/83  11/16/17 124/80     2. Hyperlipidemia with target LDL less than 100  Does no watch diet but is very active  3. Irritable bowel syndrome with constipation  She has been taking a probiotic nightly which has helped. Sh etrie sto avoid foods that caus eflare up.  4. Gastroesophageal reflux disease without esophagitis  Is on prevacid daily which works well for her  5. Osteopenia, unspecified location  Last dexscan was done 12/10/15- t score -1.9. she gets this done a GYN office and is scheuled for repeat on 03/14/18  6. Vitamin D deficiency  Takes daily vitamin d supplement    Outpatient Encounter Medications as of 03/08/2018  Medication Sig  . ALPRAZolam (XANAX) 0.25 MG tablet Take 1 tablet (0.25 mg total) by mouth at bedtime as needed.  Marland Kitchen aspirin 81 MG chewable tablet Chew by mouth daily.  Marland Kitchen atorvastatin (LIPITOR) 20 MG tablet TAKE 1 TABLET DAILY AT 6PM  . BIOTIN PO Take by mouth.  Marland Kitchen CALCIUM-MAGNESIUM PO Take by mouth.    . Cranberry 400 MG CAPS Take by mouth.  . Cyanocobalamin (VITAMIN B-12 PO) Take by mouth.    . fluticasone (FLONASE) 50 MCG/ACT nasal spray Place 2 sprays into both nostrils daily.  . lansoprazole (PREVACID) 30 MG capsule TAKE ONE (1) CAPSULE EACH DAY  . LUMIGAN 0.01 % SOLN   . meloxicam (MOBIC) 15 MG tablet TAKE ONE (1) TABLET EACH DAY  . metoprolol succinate (TOPROL-XL) 50 MG 24 hr tablet Take 1 tablet (50 mg total) by mouth daily. Take with or immediately following a meal.       New complaints: nasal congetsion  Social history: Lives with husband- stays very active- has been retired for 2 1/2  years   Review of Systems  Constitutional: Negative for activity change and appetite change.  HENT: Negative.   Eyes: Negative for pain.  Respiratory: Negative for shortness of breath.   Cardiovascular: Negative for chest pain, palpitations and leg swelling.  Gastrointestinal: Negative for abdominal pain.  Endocrine: Negative for polydipsia.  Genitourinary: Negative.   Skin: Negative for rash.  Neurological: Negative for dizziness, weakness and headaches.  Hematological: Does not bruise/bleed easily.  Psychiatric/Behavioral: Negative.   All other systems reviewed and are negative.      Objective:   Physical Exam Vitals signs and nursing note reviewed.  Constitutional:      General: She is not in acute distress.    Appearance: Normal appearance. She is well-developed.  HENT:     Head: Normocephalic.     Nose: Nose normal.  Eyes:     Pupils: Pupils are equal, round, and reactive to light.  Neck:     Musculoskeletal: Normal range of motion and neck supple.     Vascular: No carotid bruit or JVD.  Cardiovascular:     Rate and Rhythm: Normal rate and regular rhythm.     Heart sounds: Normal heart sounds.  Pulmonary:     Effort: Pulmonary effort is normal. No respiratory distress.     Breath sounds: Normal breath sounds. No wheezing or rales.  Chest:     Chest wall: No tenderness.  Abdominal:     General: Bowel sounds are normal. There is no distension or abdominal bruit.     Palpations: Abdomen is soft. There is no hepatomegaly, splenomegaly, mass or pulsatile mass.     Tenderness: There is no abdominal tenderness.  Musculoskeletal: Normal range of motion.  Lymphadenopathy:     Cervical: No cervical adenopathy.  Skin:    General: Skin is warm and dry.  Neurological:     Mental Status: She is alert and oriented to person, place, and time.     Deep Tendon Reflexes: Reflexes are normal and symmetric.  Psychiatric:        Behavior: Behavior normal.        Thought  Content: Thought content normal.        Judgment: Judgment normal.     BP (!) 143/79   Pulse 66   Temp (!) 97.5 F (36.4 C) (Oral)   Ht 4\' 11"  (1.499 m)   Wt 120 lb (54.4 kg)   BMI 24.24 kg/m        Assessment & Plan:  [name] comes in today with chief complaint of Medical Management of Chronic Issues (congestion and drainage)   Diagnosis and orders addressed:  1. Essential hypertension Low sodium diet - metoprolol succinate (TOPROL-XL) 50 MG 24 hr tablet; Take 1 tablet (50 mg total) by mouth daily. Take with or immediately following a meal.  Dispense: 90 tablet; Refill: 1  2. Hyperlipidemia with target LDL less than 100 Low fat diet - atorvastatin (LIPITOR) 20 MG tablet; Take 1 tablet (20 mg total) by mouth daily at 6 PM.  Dispense: 90 tablet; Refill: 1  3. Irritable bowel syndrome with constipation Watch diet to control flare ups Continue probiotic  4. Gastroesophageal reflux disease without esophagitis Avoid spicy foods Do not eat 2 hours prior to bedtime - lansoprazole (PREVACID) 30 MG capsule; Take 1 capsule (30 mg total) by mouth daily at 12 noon.  Dispense: 90 capsule; Refill: 1  5. Osteopenia, unspecified location Weight bearing exercises  6. Vitamin D deficiency continue daily vitamin d  7. GAD (generalized anxiety disorder) Stress management - ALPRAZolam (XANAX) 0.25 MG tablet; Take 1 tablet (0.25 mg total) by mouth at bedtime as needed.  Dispense: 30 tablet; Refill: 5   Labs pending Health Maintenance reviewed Diet and exercise encouraged  Follow up plan: 6 months   Mary-Margaret Hassell Done, FNP

## 2018-03-08 NOTE — Addendum Note (Signed)
Addended by: Rolena Infante on: 03/08/2018 03:49 PM   Modules accepted: Orders

## 2018-03-09 LAB — CMP14+EGFR
ALT: 30 IU/L (ref 0–32)
AST: 20 IU/L (ref 0–40)
Albumin/Globulin Ratio: 1.6 (ref 1.2–2.2)
Albumin: 4 g/dL (ref 3.8–4.8)
Alkaline Phosphatase: 91 IU/L (ref 39–117)
BUN/Creatinine Ratio: 24 (ref 12–28)
BUN: 18 mg/dL (ref 8–27)
Bilirubin Total: <0.2 mg/dL (ref 0.0–1.2)
CO2: 25 mmol/L (ref 20–29)
CREATININE: 0.74 mg/dL (ref 0.57–1.00)
Calcium: 10.2 mg/dL (ref 8.7–10.3)
Chloride: 103 mmol/L (ref 96–106)
GFR, EST AFRICAN AMERICAN: 96 mL/min/{1.73_m2} (ref >59)
GFR, EST NON AFRICAN AMERICAN: 83 mL/min/{1.73_m2} (ref >59)
GLUCOSE: 97 mg/dL (ref 65–99)
Globulin, Total: 2.5 g/dL (ref 1.5–4.5)
POTASSIUM: 4.6 mmol/L (ref 3.5–5.2)
Sodium: 141 mmol/L (ref 134–144)
Total Protein: 6.5 g/dL (ref 6.0–8.5)

## 2018-03-09 LAB — LIPID PANEL
CHOL/HDL RATIO: 3.1 ratio (ref 0.0–4.4)
Cholesterol, Total: 140 mg/dL (ref 100–199)
HDL: 45 mg/dL (ref >39)
LDL Calculated: 61 mg/dL (ref 0–99)
Triglycerides: 170 mg/dL — ABNORMAL HIGH (ref 0–149)
VLDL CHOLESTEROL CAL: 34 mg/dL (ref 5–40)

## 2018-03-14 ENCOUNTER — Encounter: Payer: Self-pay | Admitting: Gynecology

## 2018-03-14 ENCOUNTER — Other Ambulatory Visit: Payer: Self-pay

## 2018-03-14 ENCOUNTER — Other Ambulatory Visit: Payer: Self-pay | Admitting: Gynecology

## 2018-03-14 ENCOUNTER — Ambulatory Visit (INDEPENDENT_AMBULATORY_CARE_PROVIDER_SITE_OTHER): Payer: PPO

## 2018-03-14 DIAGNOSIS — M8580 Other specified disorders of bone density and structure, unspecified site: Secondary | ICD-10-CM

## 2018-03-14 DIAGNOSIS — M8589 Other specified disorders of bone density and structure, multiple sites: Secondary | ICD-10-CM

## 2018-03-14 DIAGNOSIS — Z780 Asymptomatic menopausal state: Secondary | ICD-10-CM | POA: Diagnosis not present

## 2018-03-21 ENCOUNTER — Other Ambulatory Visit: Payer: Self-pay | Admitting: Nurse Practitioner

## 2018-03-21 DIAGNOSIS — M1612 Unilateral primary osteoarthritis, left hip: Secondary | ICD-10-CM

## 2018-05-18 ENCOUNTER — Other Ambulatory Visit: Payer: Self-pay

## 2018-05-18 ENCOUNTER — Ambulatory Visit (INDEPENDENT_AMBULATORY_CARE_PROVIDER_SITE_OTHER): Payer: PPO | Admitting: Family Medicine

## 2018-05-18 DIAGNOSIS — J301 Allergic rhinitis due to pollen: Secondary | ICD-10-CM

## 2018-05-18 MED ORDER — AZELASTINE HCL 0.1 % NA SOLN: 1.0000 | Freq: Two times a day (BID) | NASAL | 12 refills | Status: DC

## 2018-05-18 NOTE — Progress Notes (Signed)
Telephone visit  Subjective: CC: sinus PCP: Chevis Pretty, FNP [name] is a 68 y.o. female calls for telephone consult today. Patient provides verbal consent for consult held via phone.  Location of patient: home Location of provider: [facility] Others present for call: none  1. Rhinorrhea Patient reports a greater than 1 week of rhinorrhea with postnasal drip causing throat irritation.  Symptoms seem to be worse after she rode her bicycle outside.  She denies any fevers, chills, shortness of breath or wheeze.  She is had intermittent cough that is minimally productive.  No hemoptysis.  No known sick contacts.  She is using over-the-counter Mucinex generic, Claritin and Ayr nasal spray.  She did not find Flonase helpful so she discontinued that.   ROS: Per HPI  Allergies  Allergen Reactions  . Codeine   . Sulfonamide Derivatives     REACTION: severe GI upset   Past Medical History:  Diagnosis Date  . Allergic rhinitis   . Cerumen impaction    right   . Chest discomfort   . Chronic constipation   . Diverticulosis   . GERD (gastroesophageal reflux disease)   . Glaucoma   . Hyperlipidemia   . Kidney stones   . Nasal congestion   . Osteopenia 03/2018   T score -2.0 FRAX 12% / 2.2% stable from prior DEXA  . Palpitation   . PUD (peptic ulcer disease)   . Scoliosis   . Sinusitis   . Vitamin D deficiency     Current Outpatient Medications:  .  ALPRAZolam (XANAX) 0.25 MG tablet, Take 1 tablet (0.25 mg total) by mouth at bedtime as needed., Disp: 30 tablet, Rfl: 5 .  aspirin 81 MG chewable tablet, Chew by mouth daily., Disp: , Rfl:  .  atorvastatin (LIPITOR) 20 MG tablet, Take 1 tablet (20 mg total) by mouth daily at 6 PM., Disp: 90 tablet, Rfl: 1 .  BIOTIN PO, Take by mouth., Disp: , Rfl:  .  CALCIUM-MAGNESIUM PO, Take by mouth.  , Disp: , Rfl:  .  Cranberry 400 MG CAPS, Take by mouth., Disp: , Rfl:  .  Cyanocobalamin (VITAMIN B-12 PO), Take by mouth.  , Disp: ,  Rfl:  .  lansoprazole (PREVACID) 30 MG capsule, Take 1 capsule (30 mg total) by mouth daily at 12 noon., Disp: 90 capsule, Rfl: 1 .  LUMIGAN 0.01 % SOLN, , Disp: , Rfl:  .  meloxicam (MOBIC) 15 MG tablet, TAKE ONE (1) TABLET EACH DAY, Disp: 30 tablet, Rfl: 2 .  metoprolol succinate (TOPROL-XL) 50 MG 24 hr tablet, Take 1 tablet (50 mg total) by mouth daily. Take with or immediately following a meal., Disp: 90 tablet, Rfl: 1  Assessment/ Plan: 68 y.o. female   1. Seasonal allergic rhinitis due to pollen Predominantly rhinorrhea and postnasal drip.  I have sent in Astelin nasal spray to replace the Flonase.  She can continue Ayr if she finds that helpful.  I have encouraged her to continue Claritin daily.  We discussed reasons for reevaluation.  She voiced good understanding will follow-up. - azelastine (ASTELIN) 0.1 % nasal spray; Place 1 spray into both nostrils 2 (two) times daily.  Dispense: 30 mL; Refill: 12   Start time: 1:50pm End time: 1:57pm  Total time spent on patient care (including telephone call/ virtual visit): 15 minutes  [place], [place] [phone]

## 2018-06-15 ENCOUNTER — Ambulatory Visit (INDEPENDENT_AMBULATORY_CARE_PROVIDER_SITE_OTHER): Payer: PPO | Admitting: Nurse Practitioner

## 2018-06-15 ENCOUNTER — Other Ambulatory Visit: Payer: Self-pay

## 2018-06-15 ENCOUNTER — Encounter: Payer: Self-pay | Admitting: Nurse Practitioner

## 2018-06-15 VITALS — BP 140/81 | HR 78 | Temp 97.7°F | Ht 59.0 in | Wt 116.0 lb

## 2018-06-15 DIAGNOSIS — K219 Gastro-esophageal reflux disease without esophagitis: Secondary | ICD-10-CM | POA: Diagnosis not present

## 2018-06-15 DIAGNOSIS — R0789 Other chest pain: Secondary | ICD-10-CM

## 2018-06-15 MED ORDER — PANTOPRAZOLE SODIUM 40 MG PO TBEC: 40.0000 mg | DELAYED_RELEASE_TABLET | Freq: Every day | ORAL | 3 refills | Status: DC

## 2018-06-15 NOTE — Progress Notes (Signed)
   Subjective:    Patient ID: [name], female    DOB: [birth year], 67 y.o.   MRN: [id number]   Chief Complaint: Gastroesophageal Reflux (Has been taking meloxicam)   HPI Patient come sin today c/o of increasing reflux. Has been doing it of and on for several months. Last week or so has gotten worse. Had episode of chest pain yesterday that felt like reflux and she took maalox and it relived pain. 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    Review of Systems  Constitutional: Negative.   HENT: Negative.   Respiratory: Positive for chest tightness. Negative for shortness of breath.   Cardiovascular: Positive for chest pain. Negative for palpitations and leg swelling.  Gastrointestinal:       Frequent heart burn  Genitourinary: Negative.   Musculoskeletal: Negative.   Neurological: Negative.   Psychiatric/Behavioral: Negative.   All other systems reviewed and are negative.      Objective:   Physical Exam Vitals signs and nursing note reviewed.  Constitutional:      Appearance: Normal appearance.  Cardiovascular:     Rate and Rhythm: Normal rate and regular rhythm.     Heart sounds: Normal heart sounds.  Pulmonary:     Effort: Pulmonary effort is normal.     Breath sounds: Normal breath sounds.  Skin:    General: Skin is warm and dry.  Neurological:     General: No focal deficit present.     Mental Status: She is alert and oriented to person, place, and time.  Psychiatric:        Mood and Affect: Mood normal.        Behavior: Behavior normal.     BP 140/81   Pulse 78   Temp 97.7 F (36.5 C) (Oral)   Ht 4\' 11"  (1.499 m)   Wt 116 lb (52.6 kg)   BMI 23.43 kg/m   EKG- NSR- unchannaged from Larae Grooms, FNP       Assessment & Plan:  [name] comes in today with chief complaint of Gastroesophageal Reflux (Has been taking meloxicam)   Diagnosis and orders addressed:  1. Chest  heaviness If develops chest pain that does not resolve or has SOB associated with t go to the ER - EKG 12-Lead  2. Gastroesophageal reflux disease without esophagitis Avoid spicy foods Do not eat 2 hours prior to bedtime - pantoprazole (PROTONIX) 40 MG tablet; Take 1 tablet (40 mg total) by mouth daily.  Dispense: 30 tablet; Refill: 3   Follow up plan: prn   Mary-Margaret Hassell Done, FNP

## 2018-06-15 NOTE — Patient Instructions (Signed)

## 2018-09-07 ENCOUNTER — Ambulatory Visit: Payer: PPO | Admitting: Nurse Practitioner

## 2018-09-11 ENCOUNTER — Encounter: Payer: Self-pay | Admitting: Nurse Practitioner

## 2018-09-11 ENCOUNTER — Ambulatory Visit (INDEPENDENT_AMBULATORY_CARE_PROVIDER_SITE_OTHER): Payer: PPO | Admitting: Nurse Practitioner

## 2018-09-11 ENCOUNTER — Other Ambulatory Visit: Payer: Self-pay

## 2018-09-11 VITALS — BP 130/74 | HR 73 | Temp 97.8°F | Ht 59.0 in | Wt 120.1 lb

## 2018-09-11 DIAGNOSIS — E559 Vitamin D deficiency, unspecified: Secondary | ICD-10-CM | POA: Diagnosis not present

## 2018-09-11 DIAGNOSIS — E785 Hyperlipidemia, unspecified: Secondary | ICD-10-CM | POA: Diagnosis not present

## 2018-09-11 DIAGNOSIS — Z23 Encounter for immunization: Secondary | ICD-10-CM | POA: Diagnosis not present

## 2018-09-11 DIAGNOSIS — M8580 Other specified disorders of bone density and structure, unspecified site: Secondary | ICD-10-CM

## 2018-09-11 DIAGNOSIS — I10 Essential (primary) hypertension: Secondary | ICD-10-CM | POA: Diagnosis not present

## 2018-09-11 DIAGNOSIS — K219 Gastro-esophageal reflux disease without esophagitis: Secondary | ICD-10-CM

## 2018-09-11 MED ORDER — ATORVASTATIN CALCIUM 20 MG PO TABS: 20.0000 mg | ORAL_TABLET | Freq: Every day | ORAL | 1 refills | Status: DC

## 2018-09-11 MED ORDER — PANTOPRAZOLE SODIUM 40 MG PO TBEC: 40.0000 mg | DELAYED_RELEASE_TABLET | Freq: Every day | ORAL | 1 refills | Status: DC

## 2018-09-11 MED ORDER — METOPROLOL SUCCINATE ER 50 MG PO TB24: 50.0000 mg | ORAL_TABLET | Freq: Every day | ORAL | 1 refills | Status: DC

## 2018-09-11 NOTE — Patient Instructions (Signed)
Exercising to Stay Healthy To become healthy and stay healthy, it is recommended that you do moderate-intensity and vigorous-intensity exercise. You can tell that you are exercising at a moderate intensity if your heart starts beating faster and you start breathing faster but can still hold a conversation. You can tell that you are exercising at a vigorous intensity if you are breathing much harder and faster and cannot hold a conversation while exercising. Exercising regularly is important. It has many health benefits, such as:  Improving overall fitness, flexibility, and endurance.  Increasing bone density.  Helping with weight control.  Decreasing body fat.  Increasing muscle strength.  Reducing stress and tension.  Improving overall health. How often should I exercise? Choose an activity that you enjoy, and set realistic goals. Your health care provider can help you make an activity plan that works for you. Exercise regularly as told by your health care provider. This may include:  Doing strength training two times a week, such as: ? Lifting weights. ? Using resistance bands. ? Push-ups. ? Sit-ups. ? Yoga.  Doing a certain intensity of exercise for a given amount of time. Choose from these options: ? A total of 150 minutes of moderate-intensity exercise every week. ? A total of 75 minutes of vigorous-intensity exercise every week. ? A mix of moderate-intensity and vigorous-intensity exercise every week. Children, pregnant women, people who have not exercised regularly, people who are overweight, and older adults may need to talk with a health care provider about what activities are safe to do. If you have a medical condition, be sure to talk with your health care provider before you start a new exercise program. What are some exercise ideas? Moderate-intensity exercise ideas include:  Walking 1 mile (1.6 km) in about 15 minutes.  Biking.  Hiking.  Golfing.  Dancing.   Water aerobics. Vigorous-intensity exercise ideas include:  Walking 4.5 miles (7.2 km) or more in about 1 hour.  Jogging or running 5 miles (8 km) in about 1 hour.  Biking 10 miles (16.1 km) or more in about 1 hour.  Lap swimming.  Roller-skating or in-line skating.  Cross-country skiing.  Vigorous competitive sports, such as football, basketball, and soccer.  Jumping rope.  Aerobic dancing. What are some everyday activities that can help me to get exercise?  Yard work, such as: ? Pushing a lawn mower. ? Raking and bagging leaves.  Washing your car.  Pushing a stroller.  Shoveling snow.  Gardening.  Washing windows or floors. How can I be more active in my day-to-day activities?  Use stairs instead of an elevator.  Take a walk during your lunch break.  If you drive, park your car farther away from your work or school.  If you take public transportation, get off one stop early and walk the rest of the way.  Stand up or walk around during all of your indoor phone calls.  Get up, stretch, and walk around every 30 minutes throughout the day.  Enjoy exercise with a friend. Support to continue exercising will help you keep a regular routine of activity. What guidelines can I follow while exercising?  Before you start a new exercise program, talk with your health care provider.  Do not exercise so much that you hurt yourself, feel dizzy, or get very short of breath.  Wear comfortable clothes and wear shoes with good support.  Drink plenty of water while you exercise to prevent dehydration or heat stroke.  Work out until your breathing   and your heartbeat get faster. Where to find more information  [place] Department of Health and Human Services: www.hhs.gov  Centers for Disease Control and Prevention (CDC): www.cdc.gov Summary  Exercising regularly is important. It will improve your overall fitness, flexibility, and endurance.  Regular exercise also will  improve your overall health. It can help you control your weight, reduce stress, and improve your bone density.  Do not exercise so much that you hurt yourself, feel dizzy, or get very short of breath.  Before you start a new exercise program, talk with your health care provider. This information is not intended to replace advice given to you by your health care provider. Make sure you discuss any questions you have with your health care provider. Document Released: 01/22/2010 Document Revised: 12/02/2016 Document Reviewed: 11/10/2016 Elsevier Patient Education  2020 [employer]  

## 2018-09-11 NOTE — Progress Notes (Signed)
Subjective:    Patient ID: [name], female    DOB: 10-02-1950, 68 y.o.   MRN: [id number]   Chief Complaint: Medical Management of Chronic Issues    HPI:  1. Essential hypertension No c/o chest pain, sob or headache. Does not check blood pressure at home. BP Readings from Last 3 Encounters:  09/11/18 130/74  06/15/18 140/81  03/08/18 (!) 143/79     2. Hyperlipidemia with target LDL less than 100 Does walk several days a week. Lab Results  Component Value Date   CHOL 140 03/08/2018   HDL 45 03/08/2018   LDLCALC 61 03/08/2018   TRIG 170 (H) 03/08/2018   CHOLHDL 3.1 03/08/2018     3. Gastroesophageal reflux disease without esophagitis Is on daily protonix and has no symptoms as long as takes medication  4. Osteopenia, unspecified location last dexascan was [name] 03/14/18 with t score between -.1 and -2.1. denies nay back pain.  5. Vitamin D deficiency Takes daily vitamin d supplement    Outpatient Encounter Medications as of 09/11/2018  Medication Sig  . ALPRAZolam (XANAX) 0.25 MG tablet Take 1 tablet (0.25 mg total) by mouth at bedtime as needed.  Marland Kitchen aspirin 81 MG chewable tablet Chew by mouth daily.  Marland Kitchen atorvastatin (LIPITOR) 20 MG tablet Take 1 tablet (20 mg total) by mouth daily at 6 PM.  . azelastine (ASTELIN) 0.1 % nasal spray Place 1 spray into both nostrils 2 (two) times daily.  Marland Kitchen BIOTIN PO Take by mouth.  Marland Kitchen CALCIUM-MAGNESIUM PO Take by mouth.    . Cranberry 400 MG CAPS Take by mouth.  . Cyanocobalamin (VITAMIN B-12 PO) Take by mouth.    . LUMIGAN 0.01 % SOLN   . metoprolol succinate (TOPROL-XL) 50 MG 24 hr tablet Take 1 tablet (50 mg total) by mouth daily. Take with or immediately following a meal.  . pantoprazole (PROTONIX) 40 MG tablet Take 1 tablet (40 mg total) by mouth daily.    Past Surgical History:  Procedure Laterality Date  . CARDIAC CATHETERIZATION    . COLPOSCOPY    . excision of mole  2007  . GYNECOLOGIC CRYOSURGERY    . LAPAROSCOPIC  CHOLECYSTECTOMY    . TUBAL LIGATION  1991    Family History  Problem Relation Age of Onset  . Hypertension Mother   . Heart disease Mother   . Hyperlipidemia Mother   . Glaucoma Mother   . Hypertension Father   . Cancer Father        LUNG  . Hyperlipidemia Father   . Diabetes Sister   . Hypertension Sister   . Hyperlipidemia Sister   . Fibromyalgia Daughter   . Asthma Daughter     New complaints: None today  Social history: Lives with her husband- they both stay very active  Controlled substance contract: n/a    Review of Systems  Constitutional: Negative for activity change and appetite change.  HENT: Negative.   Eyes: Negative for pain.  Respiratory: Negative for shortness of breath.   Cardiovascular: Negative for chest pain, palpitations and leg swelling.  Gastrointestinal: Negative for abdominal pain.  Endocrine: Negative for polydipsia.  Genitourinary: Negative.   Skin: Negative for rash.  Neurological: Negative for dizziness, weakness and headaches.  Hematological: Does not bruise/bleed easily.  Psychiatric/Behavioral: Negative.   All other systems reviewed and are negative.      Objective:   Physical Exam Vitals signs and nursing note reviewed.  Constitutional:      General: She  is not in acute distress.    Appearance: Normal appearance. She is well-developed.  HENT:     Head: Normocephalic.     Nose: Nose normal.  Eyes:     Pupils: Pupils are equal, round, and reactive to light.  Neck:     Musculoskeletal: Normal range of motion and neck supple.     Vascular: No carotid bruit or JVD.  Cardiovascular:     Rate and Rhythm: Normal rate and regular rhythm.     Heart sounds: Normal heart sounds.  Pulmonary:     Effort: Pulmonary effort is normal. No respiratory distress.     Breath sounds: Normal breath sounds. No wheezing or rales.  Chest:     Chest wall: No tenderness.  Abdominal:     General: Bowel sounds are normal. There is no distension  or abdominal bruit.     Palpations: Abdomen is soft. There is no hepatomegaly, splenomegaly, mass or pulsatile mass.     Tenderness: There is no abdominal tenderness.  Musculoskeletal: Normal range of motion.  Lymphadenopathy:     Cervical: No cervical adenopathy.  Skin:    General: Skin is warm and dry.  Neurological:     Mental Status: She is alert and oriented to person, place, and time.     Deep Tendon Reflexes: Reflexes are normal and symmetric.  Psychiatric:        Behavior: Behavior normal.        Thought Content: Thought content normal.        Judgment: Judgment normal.     BP 130/74   Pulse 73   Temp 97.8 F (36.6 C) (Oral)   Ht _0  (1.499 m)   Wt 120 lb 2 oz (54.5 kg)   SpO2 98%   BMI 24.26 kg/m        Assessment & Plan:  [name] comes in today with chief complaint of Medical Management of Chronic Issues   Diagnosis and orders addressed:  1. Essential hypertension Low sodium diet - metoprolol succinate (TOPROL-XL) 50 MG 24 hr tablet; Take 1 tablet (50 mg total) by mouth daily. Take with or immediately following a meal.  Dispense: 90 tablet; Refill: 1 - CMP14+EGFR  2. Hyperlipidemia with target LDL less than 100 Low fat diet - atorvastatin (LIPITOR) 20 MG tablet; Take 1 tablet (20 mg total) by mouth daily at 6 PM.  Dispense: 90 tablet; Refill: 1 - Lipid panel  3. Gastroesophageal reflux disease without esophagitis Avoid spicy foods Do not eat 2 hours prior to bedtime - pantoprazole (PROTONIX) 40 MG tablet; Take 1 tablet (40 mg total) by mouth daily.  Dispense: 90 tablet; Refill: 1  4. Osteopenia, unspecified location Weight bearing exercises  5. Vitamin D deficiency Continue daily vitamin d supplement   Labs pending Health Maintenance reviewed Diet and exercise encouraged  Follow up plan: 6 months   [name] Hassell Done, FNP

## 2018-09-12 LAB — CMP14+EGFR
ALT: 32 IU/L (ref 0–32)
AST: 30 IU/L (ref 0–40)
Albumin/Globulin Ratio: 1.8 (ref 1.2–2.2)
Albumin: 4.2 g/dL (ref 3.8–4.8)
Alkaline Phosphatase: 83 IU/L (ref 39–117)
BUN/Creatinine Ratio: 18 (ref 12–28)
BUN: 14 mg/dL (ref 8–27)
Bilirubin Total: 0.4 mg/dL (ref 0.0–1.2)
CO2: 24 mmol/L (ref 20–29)
Calcium: 10 mg/dL (ref 8.7–10.3)
Chloride: 103 mmol/L (ref 96–106)
Creatinine, Ser: 0.77 mg/dL (ref 0.57–1.00)
GFR calc Af Amer: 91 mL/min/{1.73_m2} (ref >59)
GFR calc non Af Amer: 79 mL/min/{1.73_m2} (ref >59)
Globulin, Total: 2.4 g/dL (ref 1.5–4.5)
Glucose: 94 mg/dL (ref 65–99)
Potassium: 4.1 mmol/L (ref 3.5–5.2)
Sodium: 142 mmol/L (ref 134–144)
Total Protein: 6.6 g/dL (ref 6.0–8.5)

## 2018-09-12 LAB — LIPID PANEL
Chol/HDL Ratio: 3.4 ratio (ref 0.0–4.4)
Cholesterol, Total: 155 mg/dL (ref 100–199)
HDL: 46 mg/dL (ref >39)
LDL Chol Calc (NIH): 80 mg/dL (ref 0–99)
Triglycerides: 168 mg/dL — ABNORMAL HIGH (ref 0–149)
VLDL Cholesterol Cal: 29 mg/dL (ref 5–40)

## 2018-10-03 ENCOUNTER — Other Ambulatory Visit: Payer: Self-pay | Admitting: Gynecology

## 2018-10-03 DIAGNOSIS — Z1231 Encounter for screening mammogram for malignant neoplasm of breast: Secondary | ICD-10-CM

## 2018-10-10 ENCOUNTER — Encounter: Payer: Self-pay | Admitting: Gynecology

## 2018-11-20 ENCOUNTER — Ambulatory Visit (INDEPENDENT_AMBULATORY_CARE_PROVIDER_SITE_OTHER): Payer: PPO | Admitting: *Deleted

## 2018-11-20 DIAGNOSIS — Z0000 Encounter for general adult medical examination without abnormal findings: Secondary | ICD-10-CM

## 2018-11-20 NOTE — Progress Notes (Addendum)
MEDICARE ANNUAL WELLNESS VISIT  11/20/2018  Telephone Visit Disclaimer This Medicare AWV was conducted by telephone due to national recommendations for restrictions regarding the COVID-19 Pandemic (e.g. social distancing).  I verified, using two identifiers, that I am speaking with [name] or their authorized healthcare agent. I discussed the limitations, risks, security, and privacy concerns of performing an evaluation and management service by telephone and the potential availability of an in-person appointment in the future. The patient expressed understanding and agreed to proceed.   Subjective:  [name] is a 68 y.o. female patient of [name], [place] who had a Medicare Annual Wellness Visit today via telephone. [name] is Retired and lives with their spouse and grand daughter. she has 1 child. she reports that she is socially active and does interact with friends/family regularly. she is minimally physically active and enjoys going to antique shows, shopping for antiques, flea markets, decorating for the holidays and riding her bike.  Patient Care Team: [name] Pretty, FNP as PCP - General (Family Medicine) [facility] (Dermatology) Phineas Real, Belinda Block, MD as Consulting Physician (Gynecology) Imaging, [facility] (Diagnostic Radiology)  Advanced Directives 11/20/2018 11/15/2017 10/19/2016  Does Patient Have a Medical Advance Directive? No No No  Would patient like information on creating a medical advance directive? No - Patient declined No - Patient declined Yes (MAU/Ambulatory/Procedural Areas - Information given)    Hospital Utilization Over the Past 12 Months: # of hospitalizations or ER visits: 0 # of surgeries: 0  Review of Systems    Patient reports that her overall health is unchanged compared to last year.  History obtained from chart review  Patient Reported Readings (BP, Pulse, CBG, Weight, etc) none   Pain Assessment Pain : No/denies pain     Current Medications & Allergies (verified) Allergies as of 11/20/2018      Reactions   Codeine    Sulfonamide Derivatives    REACTION: severe GI upset      Medication List       Accurate as of November 20, 2018 10:52 AM. If you have any questions, ask your nurse or doctor.        ALPRAZolam 0.25 MG tablet Commonly known as: XANAX Take 1 tablet (0.25 mg total) by mouth at bedtime as needed.   aspirin 81 MG chewable tablet Chew by mouth daily.   atorvastatin 20 MG tablet Commonly known as: LIPITOR Take 1 tablet (20 mg total) by mouth daily at 6 PM.   azelastine 0.1 % nasal spray Commonly known as: ASTELIN Place 1 spray into both nostrils 2 (two) times daily.   BIOTIN PO Take by mouth.   CALCIUM-MAGNESIUM PO Take by mouth.   Cranberry 400 MG Caps Take by mouth.   Lumigan 0.01 % Soln Generic drug: bimatoprost   metoprolol succinate 50 MG 24 hr tablet Commonly known as: TOPROL-XL Take 1 tablet (50 mg total) by mouth daily. Take with or immediately following a meal.   pantoprazole 40 MG tablet Commonly known as: PROTONIX Take 1 tablet (40 mg total) by mouth daily.   pyridOXINE 100 MG tablet Commonly known as: VITAMIN B-6 Take 100 mg by mouth daily.   VITAMIN B-12 PO Take 2,500 mcg by mouth daily.       History (reviewed): Past Medical History:  Diagnosis Date  . Allergic rhinitis   . Cerumen impaction    right   . Chest discomfort   . Chronic constipation   .  Diverticulosis   . GERD (gastroesophageal reflux disease)   . Glaucoma   . Hyperlipidemia   . Kidney stones   . Nasal congestion   . Osteopenia 03/2018   T score -2.0 FRAX 12% / 2.2% stable from prior DEXA  . Palpitation   . PUD (peptic ulcer disease)   . Scoliosis   . Sinusitis   . Vitamin D deficiency    Past Surgical History:  Procedure Laterality Date  . CARDIAC CATHETERIZATION    . COLPOSCOPY    . excision of mole  2007  .  GYNECOLOGIC CRYOSURGERY    . LAPAROSCOPIC CHOLECYSTECTOMY    . TUBAL LIGATION  1991   Family History  Problem Relation Age of Onset  . Hypertension Mother   . Heart disease Mother   . Hyperlipidemia Mother   . Glaucoma Mother   . Hypertension Father   . Cancer Father        LUNG  . Hyperlipidemia Father   . Diabetes Sister   . Hypertension Sister   . Hyperlipidemia Sister   . Fibromyalgia Daughter   . Asthma Daughter    Social History   Socioeconomic History  . Marital status: Married    Spouse name: [name]  . Number of children: 1  . Years of education: 4  . Highest education level: Associate degree: occupational, Hotel manager, or vocational program  Occupational History  . Occupation: Retired    Fish farm manager: [employer]  Social Needs  . Financial resource strain: Not hard at all  . Food insecurity    Worry: Never true    Inability: Never true  . Transportation needs    Medical: No    Non-medical: No  Tobacco Use  . Smoking status: Never Smoker  . Smokeless tobacco: Never Used  Substance and Sexual Activity  . Alcohol use: Not Currently    Alcohol/week: 0.0 standard drinks  . Drug use: No  . Sexual activity: Yes    Birth control/protection: Post-menopausal, Surgical    Comment: Tubal lig-1st intercourse 68 yo-Fewer than 5 partners  Lifestyle  . Physical activity    Days per week: 2 days    Minutes per session: 30 min  . Stress: Not at all  Relationships  . Social connections    Talks on phone: More than three times a week    Gets together: More than three times a week    Attends religious service: More than 4 times per year    Active member of club or organization: Yes    Attends meetings of clubs or organizations: More than 4 times per year    Relationship status: Married  Other Topics Concern  . Not on file  Social History Narrative  . Not on file    Activities of Daily Living In your present state of health, do you have any difficulty performing the  following activities: 11/20/2018  Hearing? Y  Comment has noticed she doesn't hear as good as she used to  Vision? Y  Comment monovision-wears one contact in right eye-gets yearly eye exam-has glaucoma  Difficulty concentrating or making decisions? N  Walking or climbing stairs? N  Dressing or bathing? N  Doing errands, shopping? N  Preparing Food and eating ? N  Using the Toilet? N  In the past six months, have you accidently leaked urine? N  Do you have problems with loss of bowel control? N  Managing your Medications? N  Managing your Finances? N  Housekeeping or managing your Housekeeping?  N  Some recent data might be hidden    Patient Education/ Literacy How often do you need to have someone help you when you read instructions, pamphlets, or other written materials from your doctor or pharmacy?: 1 - Never What is the last grade level you completed in school?: Associates Degree  Exercise Current Exercise Habits: Home exercise routine, Type of exercise: walking, Time (Minutes): 30, Frequency (Times/Week): 2, Weekly Exercise (Minutes/Week): 60, Intensity: Mild, Exercise limited by: None identified  Diet Patient reports consuming 3 meals a day and 1 snack(s) a day Patient reports that her primary diet is: Regular Patient reports that she does have regular access to food.   Depression Screen PHQ 2/9 Scores 11/20/2018 09/11/2018 06/15/2018 03/08/2018 12/12/2017 11/15/2017 07/24/2017  PHQ - 2 Score 0 0 0 0 0 0 0  PHQ- 9 Score - 0 - - - - -     Fall Risk Fall Risk  11/20/2018 09/11/2018 06/15/2018 03/08/2018 11/15/2017  Falls in the past year? 0 1 0 0 0  Number falls in past yr: - 0 - - -  Injury with Fall? - 0 - - -  Follow up - - - - -     Objective:  [name] seemed alert and oriented and she participated appropriately during our telephone visit.  Blood Pressure Weight BMI  BP Readings from Last 3 Encounters:  09/11/18 130/74  06/15/18 140/81  03/08/18 (!) 143/79   Wt  Readings from Last 3 Encounters:  09/11/18 120 lb 2 oz (54.5 kg)  06/15/18 116 lb (52.6 kg)  03/08/18 120 lb (54.4 kg)   BMI Readings from Last 1 Encounters:  09/11/18 24.26 kg/m    *Unable to obtain current vital signs, weight, and BMI due to telephone visit type  Hearing/Vision  . Janashia did not seem to have difficulty with hearing/understanding during the telephone conversation . Reports that she has had a formal eye exam by an eye care professional within the past year . Reports that she has not had a formal hearing evaluation within the past year *Unable to fully assess hearing and vision during telephone visit type  Cognitive Function: 6CIT Screen 11/20/2018  What Year? 0 points  What month? 0 points  What time? 0 points  Count back from 20 0 points  Months in reverse 0 points  Repeat phrase 0 points  Total Score 0   (Normal:0-7, Significant for Dysfunction: >8)  Normal Cognitive Function Screening: Yes   Immunization & Health Maintenance Record Immunization History  Administered Date(s) Administered  . Fluad Quad(high Dose 65+) 09/11/2018  . Influenza, High Dose Seasonal PF 10/19/2016, 10/09/2017  . Influenza,inj,Quad PF,6+ Mos 10/14/2015  . Influenza-Unspecified 11/18/2014  . Pneumococcal Conjugate-13 07/11/2014  . Pneumococcal Polysaccharide-23 07/21/2015  . Tdap 05/14/2011  . Zoster 04/04/2011    Health Maintenance  Topic Date Due  . COLONOSCOPY  10/22/2019  . MAMMOGRAM  11/17/2019  . TETANUS/TDAP  05/13/2021  . INFLUENZA VACCINE  Completed  . DEXA SCAN  Completed  . Hepatitis C Screening  Completed  . PNA vac Low Risk Adult  Completed       Assessment  This is a routine wellness examination for [name].  Health Maintenance: Due or Overdue There are no preventive care reminders to display for this patient.  [name] does not need a referral for Community Assistance: Care Management:   no Social Work:    no Prescription Assistance:  no  Nutrition/Diabetes Education:  no  Plan:  Personalized Goals Goals Addressed            This Visit's Progress   . DIET - INCREASE WATER INTAKE       Try to drink 6-8 glasses of water daily      Personalized Health Maintenance & Screening Recommendations  Screening mammography  Lung Cancer Screening Recommended: no (Low Dose CT Chest recommended if Age 47-80 years, 30 pack-year currently smoking OR have quit w/in past 15 years) Hepatitis C Screening recommended: no HIV Screening recommended: no  Advanced Directives: Written information was not prepared per patient's request.  Referrals & Orders No orders of the defined types were placed in this encounter.   Follow-up Plan . Follow-up with [name] Pretty, FNP as planned . Keep your appointment to have your Screening Mammogram 11/22/18   I have personally reviewed and noted the following in the patient's chart:   . Medical and social history . Use of alcohol, tobacco or illicit drugs  . Current medications and supplements . Functional ability and status . Nutritional status . Physical activity . Advanced directives . List of other physicians . Hospitalizations, surgeries, and ER visits in previous 12 months . Vitals . Screenings to include cognitive, depression, and falls . Referrals and appointments  In addition, I have reviewed and discussed with [name] certain preventive protocols, quality metrics, and best practice recommendations. A written personalized care plan for preventive services as well as general preventive health recommendations is available and can be mailed to the patient at her request.      [place], Donny Pique, LPN  [phone]   I have reviewed and agree with the above AWV documentation.   Mary-Margaret Hassell Done, FNP

## 2018-11-20 NOTE — Patient Instructions (Signed)
Preventive Care 69 Years and Older, Female Preventive care refers to lifestyle choices and visits with your health care provider that can promote health and wellness. This includes:  A yearly physical exam. This is also called an annual well check.  Regular dental and eye exams.  Immunizations.  Screening for certain conditions.  Healthy lifestyle choices, such as diet and exercise. What can I expect for my preventive care visit? Physical exam Your health care provider will check:  Height and weight. These may be used to calculate body mass index (BMI), which is a measurement that tells if you are at a healthy weight.  Heart rate and blood pressure.  Your skin for abnormal spots. Counseling Your health care provider may ask you questions about:  Alcohol, tobacco, and drug use.  Emotional well-being.  Home and relationship well-being.  Sexual activity.  Eating habits.  History of falls.  Memory and ability to understand (cognition).  Work and work Statistician.  Pregnancy and menstrual history. What immunizations do I need?  Influenza (flu) vaccine  This is recommended every year. Tetanus, diphtheria, and pertussis (Tdap) vaccine  You may need a Td booster every 10 years. Varicella (chickenpox) vaccine  You may need this vaccine if you have not already been vaccinated. Zoster (shingles) vaccine  You may need this after age 33. Pneumococcal conjugate (PCV13) vaccine  One dose is recommended after age 33. Pneumococcal polysaccharide (PPSV23) vaccine  One dose is recommended after age 72. Measles, mumps, and rubella (MMR) vaccine  You may need at least one dose of MMR if you were born in 1957 or later. You may also need a second dose. Meningococcal conjugate (MenACWY) vaccine  You may need this if you have certain conditions. Hepatitis A vaccine  You may need this if you have certain conditions or if you travel or work in places where you may be exposed  to hepatitis A. Hepatitis B vaccine  You may need this if you have certain conditions or if you travel or work in places where you may be exposed to hepatitis B. Haemophilus influenzae type b (Hib) vaccine  You may need this if you have certain conditions. You may receive vaccines as individual doses or as more than one vaccine together in one shot (combination vaccines). Talk with your health care provider about the risks and benefits of combination vaccines. What tests do I need? Blood tests  Lipid and cholesterol levels. These may be checked every 5 years, or more frequently depending on your overall health.  Hepatitis C test.  Hepatitis B test. Screening  Lung cancer screening. You may have this screening every year starting at age 39 if you have a 30-pack-year history of smoking and currently smoke or have quit within the past 15 years.  Colorectal cancer screening. All adults should have this screening starting at age 36 and continuing until age 15. Your health care provider may recommend screening at age 23 if you are at increased risk. You will have tests every 1-10 years, depending on your results and the type of screening test.  Diabetes screening. This is done by checking your blood sugar (glucose) after you have not eaten for a while (fasting). You may have this done every 1-3 years.  Mammogram. This may be done every 1-2 years. Talk with your health care provider about how often you should have regular mammograms.  BRCA-related cancer screening. This may be done if you have a family history of breast, ovarian, tubal, or peritoneal cancers.  Other tests  Sexually transmitted disease (STD) testing.  Bone density scan. This is done to screen for osteoporosis. You may have this done starting at age 76. Follow these instructions at home: Eating and drinking  Eat a diet that includes fresh fruits and vegetables, whole grains, lean protein, and low-fat dairy products. Limit  your intake of foods with high amounts of sugar, saturated fats, and salt.  Take vitamin and mineral supplements as recommended by your health care provider.  Do not drink alcohol if your health care provider tells you not to drink.  If you drink alcohol: ? Limit how much you have to 0-1 drink a day. ? Be aware of how much alcohol is in your drink. In the [place] one drink equals one 12 oz bottle of beer (355 mL), one 5 oz glass of wine (148 mL), or one 1 oz glass of hard liquor (44 mL). Lifestyle  Take daily care of your teeth and gums.  Stay active. Exercise for at least 30 minutes on 5 or more days each week.  Do not use any products that contain nicotine or tobacco, such as cigarettes, e-cigarettes, and chewing tobacco. If you need help quitting, ask your health care provider.  If you are sexually active, practice safe sex. Use a condom or other form of protection in order to prevent STIs (sexually transmitted infections).  Talk with your health care provider about taking a low-dose aspirin or statin. What's next?  Go to your health care provider once a year for a well check visit.  Ask your health care provider how often you should have your eyes and teeth checked.  Stay up to date on all vaccines. This information is not intended to replace advice given to you by your health care provider. Make sure you discuss any questions you have with your health care provider. Document Released: 01/16/2015 Document Revised: 12/14/2017 Document Reviewed: 12/14/2017 Elsevier Patient Education  2020 [employer].

## 2018-11-21 ENCOUNTER — Other Ambulatory Visit: Payer: Self-pay

## 2018-11-22 ENCOUNTER — Ambulatory Visit
Admission: RE | Admit: 2018-11-22 | Discharge: 2018-11-22 | Disposition: A | Discharge status: Home / Self Care | Payer: PPO | Source: Ambulatory Visit | Attending: Gynecology | Admitting: Gynecology

## 2018-11-22 ENCOUNTER — Ambulatory Visit (INDEPENDENT_AMBULATORY_CARE_PROVIDER_SITE_OTHER): Payer: PPO | Admitting: Gynecology

## 2018-11-22 ENCOUNTER — Encounter: Payer: Self-pay | Admitting: Gynecology

## 2018-11-22 VITALS — BP 126/80 | Ht 59.0 in | Wt 122.0 lb

## 2018-11-22 DIAGNOSIS — Z01419 Encounter for gynecological examination (general) (routine) without abnormal findings: Secondary | ICD-10-CM | POA: Diagnosis not present

## 2018-11-22 DIAGNOSIS — Z1231 Encounter for screening mammogram for malignant neoplasm of breast: Secondary | ICD-10-CM

## 2018-11-22 DIAGNOSIS — M8580 Other specified disorders of bone density and structure, unspecified site: Secondary | ICD-10-CM

## 2018-11-22 DIAGNOSIS — N952 Postmenopausal atrophic vaginitis: Secondary | ICD-10-CM

## 2018-11-22 NOTE — Progress Notes (Signed)
    [name] 1950-05-30 [id number]        68 y.o.  G1P1001 for breast and pelvic exam.  Without gynecologic complaints  Past medical history,surgical history, problem list, medications, allergies, family history and social history were all reviewed and documented as reviewed in the EPIC chart.  ROS:  Performed with pertinent positives and negatives included in the history, assessment and plan.   Additional significant findings : None   Exam: [name] assistant Vitals:   11/22/18 0946  BP: 126/80  Weight: 122 lb (55.3 kg)  Height: 4\' 11"  (1.499 m)   Body mass index is 24.64 kg/m.  General appearance:  Normal affect, orientation and appearance. Skin: Grossly normal HEENT: Without gross lesions.  No cervical or supraclavicular adenopathy. Thyroid normal.  Lungs:  Clear without wheezing, rales or rhonchi Cardiac: RR, without RMG Abdominal:  Soft, nontender, without masses, guarding, rebound, organomegaly or hernia Breasts:  Examined lying and sitting without masses, retractions, discharge or axillary adenopathy. Pelvic:  Ext, BUS, Vagina: Normal with atrophic changes  Cervix: Normal with atrophic changes  Uterus: Anteverted, normal size, shape and contour, midline and mobile nontender   Adnexa: Without masses or tenderness    Anus and perineum: Normal   Rectovaginal: Normal sphincter tone without palpated masses or tenderness.    Assessment/Plan:  68 y.o. G58P1001 female for breast and pelvic exam.  1. Postmenopausal.  No significant menopausal symptoms or any vaginal bleeding. 2. Osteopenia.  DEXA 2020 T score -2.0 FRAX 12% / 2.2%.  Plan repeat DEXA at 2-year interval. 3. Pap smear 2019.  No Pap smear done today.  History of cryosurgery a number of years ago with normal Pap smears since.  Options to stop screening versus less frequent screening intervals reviewed.  Will readdress on an annual basis. 4. Colonoscopy 2011 with planned repeat next year. 5. Mammography  today.  Breast exam normal today. 6. Health maintenance.  No routine lab work done as patient does this elsewhere.  Follow-up 1 year, sooner as needed.   Anastasio Auerbach MD, 10:18 AM 11/22/2018

## 2018-11-22 NOTE — Patient Instructions (Signed)
Follow-up 1 year, sooner as needed

## 2019-02-07 DIAGNOSIS — H401131 Primary open-angle glaucoma, bilateral, mild stage: Secondary | ICD-10-CM | POA: Diagnosis not present

## 2019-03-05 ENCOUNTER — Other Ambulatory Visit: Payer: Self-pay | Admitting: Nurse Practitioner

## 2019-03-05 DIAGNOSIS — K219 Gastro-esophageal reflux disease without esophagitis: Secondary | ICD-10-CM

## 2019-03-08 ENCOUNTER — Other Ambulatory Visit: Payer: Self-pay

## 2019-03-11 ENCOUNTER — Other Ambulatory Visit: Payer: Self-pay

## 2019-03-11 ENCOUNTER — Encounter: Payer: Self-pay | Admitting: Nurse Practitioner

## 2019-03-11 ENCOUNTER — Ambulatory Visit (INDEPENDENT_AMBULATORY_CARE_PROVIDER_SITE_OTHER): Payer: PPO | Admitting: Nurse Practitioner

## 2019-03-11 VITALS — BP 149/78 | HR 63 | Temp 97.8°F | Resp 20 | Ht 59.0 in | Wt 120.0 lb

## 2019-03-11 DIAGNOSIS — E559 Vitamin D deficiency, unspecified: Secondary | ICD-10-CM

## 2019-03-11 DIAGNOSIS — I10 Essential (primary) hypertension: Secondary | ICD-10-CM

## 2019-03-11 DIAGNOSIS — M8580 Other specified disorders of bone density and structure, unspecified site: Secondary | ICD-10-CM | POA: Diagnosis not present

## 2019-03-11 DIAGNOSIS — E785 Hyperlipidemia, unspecified: Secondary | ICD-10-CM | POA: Diagnosis not present

## 2019-03-11 DIAGNOSIS — K219 Gastro-esophageal reflux disease without esophagitis: Secondary | ICD-10-CM | POA: Diagnosis not present

## 2019-03-11 MED ORDER — PANTOPRAZOLE SODIUM 40 MG PO TBEC: DELAYED_RELEASE_TABLET | ORAL | 1 refills | Status: DC

## 2019-03-11 MED ORDER — METOPROLOL SUCCINATE ER 50 MG PO TB24: 50.0000 mg | ORAL_TABLET | Freq: Every day | ORAL | 1 refills | Status: DC

## 2019-03-11 MED ORDER — ATORVASTATIN CALCIUM 20 MG PO TABS: 20.0000 mg | ORAL_TABLET | Freq: Every day | ORAL | 1 refills | Status: DC

## 2019-03-11 NOTE — Progress Notes (Signed)
Subjective:    Patient ID: [name], female    DOB: 1950-06-19, 69 y.o.   MRN: [id number]   Chief Complaint: Medical Management of Chronic Issues    HPI:  1. Essential hypertension No c/o chest pain, sob or headache. Doe snot check blood pressure at home. BP Readings from Last 3 Encounters:  03/11/19 (!) 149/78  11/22/18 126/80  09/11/18 130/74     2. Gastroesophageal reflux disease without esophagitis Is on protonix daily and is doing well.   3. Hyperlipidemia with target LDL less than 100 Does not watch diet and does very little exercise. Lab Results  Component Value Date   CHOL 155 09/11/2018   HDL 46 09/11/2018   LDLCALC 80 09/11/2018   TRIG 168 (H) 09/11/2018   CHOLHDL 3.4 09/11/2018     4. Osteopenia, unspecified location last dexascan was done 03/14/18 with t score showing low bone mass  5. GAD Is on xanax 0.25 - only takes prn. Usually only takes maybe 1x a month.   6. Vitamin D deficiency Takes vitamin d in her calcium tablet.    Outpatient Encounter Medications as of 03/11/2019  Medication Sig  . ALPRAZolam (XANAX) 0.25 MG tablet Take 1 tablet (0.25 mg total) by mouth at bedtime as needed.  Marland Kitchen aspirin 81 MG chewable tablet Chew by mouth daily.  Marland Kitchen atorvastatin (LIPITOR) 20 MG tablet Take 1 tablet (20 mg total) by mouth daily at 6 PM.  . BIOTIN PO Take by mouth.  Marland Kitchen CALCIUM-MAGNESIUM PO Take by mouth.    . Cranberry 400 MG CAPS Take by mouth.  . Cyanocobalamin (VITAMIN B-12 PO) Take 2,500 mcg by mouth daily.   Marland Kitchen LUMIGAN 0.01 % SOLN   . metoprolol succinate (TOPROL-XL) 50 MG 24 hr tablet Take 1 tablet (50 mg total) by mouth daily. Take with or immediately following a meal.  . pantoprazole (PROTONIX) 40 MG tablet TAKE ONE (1) TABLET EACH DAY  . pyridOXINE (VITAMIN B-6) 100 MG tablet Take 100 mg by mouth daily.    Past Surgical History:  Procedure Laterality Date  . CARDIAC CATHETERIZATION    . COLPOSCOPY    . excision of mole  2007  .  GYNECOLOGIC CRYOSURGERY    . LAPAROSCOPIC CHOLECYSTECTOMY    . TUBAL LIGATION  1991    Family History  Problem Relation Age of Onset  . Hypertension Mother   . Heart disease Mother   . Hyperlipidemia Mother   . Glaucoma Mother   . Hypertension Father   . Cancer Father        LUNG  . Hyperlipidemia Father   . Diabetes Sister   . Hypertension Sister   . Hyperlipidemia Sister   . Fibromyalgia Daughter   . Asthma Daughter     New complaints: None today  Social history: Lives with husband  Controlled substance contract: 03/11/19    Review of Systems  Constitutional: Negative for diaphoresis.  Eyes: Negative for pain.  Respiratory: Negative for shortness of breath.   Cardiovascular: Negative for chest pain, palpitations and leg swelling.  Gastrointestinal: Negative for abdominal pain.  Endocrine: Negative for polydipsia.  Skin: Negative for rash.  Neurological: Negative for dizziness, weakness and headaches.  Hematological: Does not bruise/bleed easily.  All other systems reviewed and are negative.      Objective:   Physical Exam Vitals and nursing note reviewed.  Constitutional:      General: She is not in acute distress.    Appearance: Normal appearance. She  is well-developed.  HENT:     Head: Normocephalic.     Nose: Nose normal.  Eyes:     Pupils: Pupils are equal, round, and reactive to light.  Neck:     Vascular: No carotid bruit or JVD.  Cardiovascular:     Rate and Rhythm: Normal rate and regular rhythm.     Heart sounds: Normal heart sounds.  Pulmonary:     Effort: Pulmonary effort is normal. No respiratory distress.     Breath sounds: Normal breath sounds. No wheezing or rales.  Chest:     Chest wall: No tenderness.  Abdominal:     General: Bowel sounds are normal. There is no distension or abdominal bruit.     Palpations: Abdomen is soft. There is no hepatomegaly, splenomegaly, mass or pulsatile mass.     Tenderness: There is no abdominal  tenderness.  Musculoskeletal:        General: Normal range of motion.     Cervical back: Normal range of motion and neck supple.  Lymphadenopathy:     Cervical: No cervical adenopathy.  Skin:    General: Skin is warm and dry.  Neurological:     Mental Status: She is alert and oriented to person, place, and time.     Deep Tendon Reflexes: Reflexes are normal and symmetric.  Psychiatric:        Behavior: Behavior normal.        Thought Content: Thought content normal.        Judgment: Judgment normal.     BP (!) 149/78   Pulse 63   Temp 97.8 F (36.6 C) (Temporal)   Resp 20   Ht 4' 11" (1.499 m)   Wt 120 lb (54.4 kg)   SpO2 100%   BMI 24.24 kg/m        Assessment & Plan:  Gabriella R Wyche comes in today with chief complaint of Medical Management of Chronic Issues   Diagnosis and orders addressed:  1. Essential hypertension Low sodium diet - metoprolol succinate (TOPROL-XL) 50 MG 24 hr tablet; Take 1 tablet (50 mg total) by mouth daily. Take with or immediately following a meal.  Dispense: 90 tablet; Refill: 1 - CMP14+EGFR - CBC with Differential/Platelet  2. Gastroesophageal reflux disease without esophagitis Avoid spicy foods Do not eat 2 hours prior to bedtime - pantoprazole (PROTONIX) 40 MG tablet; TAKE ONE (1) TABLET EACH DAY  Dispense: 90 tablet; Refill: 1  3. Hyperlipidemia with target LDL less than 100 Low fat diet - atorvastatin (LIPITOR) 20 MG tablet; Take 1 tablet (20 mg total) by mouth daily at 6 PM.  Dispense: 90 tablet; Refill: 1 - Lipid panel  4. Osteopenia, unspecified location Weight bearing exercise  5. Vitamin D deficiency Increase vitamin d to 1000iu daily   Labs pending Health Maintenance reviewed Diet and exercise encouraged  Follow up plan: 6 months   Mary-Margaret Martin, FNP   

## 2019-03-11 NOTE — Progress Notes (Signed)
   Subjective:    Patient ID: [name], female    DOB: 01-01-1951, 69 y.o.   MRN: [id number]  HPI    Review of Systems     Objective:   Physical Exam        Assessment & Plan:

## 2019-03-11 NOTE — Patient Instructions (Signed)

## 2019-03-12 ENCOUNTER — Ambulatory Visit: Payer: Self-pay | Admitting: Nurse Practitioner

## 2019-03-12 LAB — LIPID PANEL
Chol/HDL Ratio: 3.1 ratio (ref 0.0–4.4)
Cholesterol, Total: 151 mg/dL (ref 100–199)
HDL: 48 mg/dL (ref >39)
LDL Chol Calc (NIH): 82 mg/dL (ref 0–99)
Triglycerides: 119 mg/dL (ref 0–149)
VLDL Cholesterol Cal: 21 mg/dL (ref 5–40)

## 2019-03-12 LAB — CMP14+EGFR
ALT: 31 IU/L (ref 0–32)
AST: 30 IU/L (ref 0–40)
Albumin/Globulin Ratio: 1.8 (ref 1.2–2.2)
Albumin: 4.2 g/dL (ref 3.8–4.8)
Alkaline Phosphatase: 95 IU/L (ref 39–117)
BUN/Creatinine Ratio: 16 (ref 12–28)
BUN: 12 mg/dL (ref 8–27)
Bilirubin Total: 0.4 mg/dL (ref 0.0–1.2)
CO2: 23 mmol/L (ref 20–29)
Calcium: 9.9 mg/dL (ref 8.7–10.3)
Chloride: 104 mmol/L (ref 96–106)
Creatinine, Ser: 0.75 mg/dL (ref 0.57–1.00)
GFR calc Af Amer: 93 mL/min/{1.73_m2} (ref >59)
GFR calc non Af Amer: 81 mL/min/{1.73_m2} (ref >59)
Globulin, Total: 2.4 g/dL (ref 1.5–4.5)
Glucose: 100 mg/dL — ABNORMAL HIGH (ref 65–99)
Potassium: 4.2 mmol/L (ref 3.5–5.2)
Sodium: 143 mmol/L (ref 134–144)
Total Protein: 6.6 g/dL (ref 6.0–8.5)

## 2019-03-12 LAB — CBC WITH DIFFERENTIAL/PLATELET
Basophils Absolute: 0 10*3/uL (ref 0.0–0.2)
Basos: 0 %
EOS (ABSOLUTE): 0.2 10*3/uL (ref 0.0–0.4)
Eos: 3 %
Hematocrit: 40.5 % (ref 34.0–46.6)
Hemoglobin: 13.6 g/dL (ref 11.1–15.9)
Immature Grans (Abs): 0 10*3/uL (ref 0.0–0.1)
Immature Granulocytes: 0 %
Lymphocytes Absolute: 2.9 10*3/uL (ref 0.7–3.1)
Lymphs: 39 %
MCH: 30.8 pg (ref 26.6–33.0)
MCHC: 33.6 g/dL (ref 31.5–35.7)
MCV: 92 fL (ref 79–97)
Monocytes Absolute: 0.6 10*3/uL (ref 0.1–0.9)
Monocytes: 8 %
Neutrophils Absolute: 3.7 10*3/uL (ref 1.4–7.0)
Neutrophils: 50 %
Platelets: 226 10*3/uL (ref 150–450)
RBC: 4.42 x10E6/uL (ref 3.77–5.28)
RDW: 13.7 % (ref 11.7–15.4)
WBC: 7.5 10*3/uL (ref 3.4–10.8)

## 2019-03-14 ENCOUNTER — Other Ambulatory Visit: Payer: Self-pay | Admitting: Physician Assistant

## 2019-03-14 DIAGNOSIS — D3617 Benign neoplasm of peripheral nerves and autonomic nervous system of trunk, unspecified: Secondary | ICD-10-CM | POA: Diagnosis not present

## 2019-03-14 DIAGNOSIS — D485 Neoplasm of uncertain behavior of skin: Secondary | ICD-10-CM | POA: Diagnosis not present

## 2019-03-14 DIAGNOSIS — L820 Inflamed seborrheic keratosis: Secondary | ICD-10-CM | POA: Diagnosis not present

## 2019-03-19 ENCOUNTER — Telehealth: Payer: Self-pay | Admitting: Nurse Practitioner

## 2019-03-19 NOTE — Chronic Care Management (AMB) (Signed)
  Chronic Care Management   Note  03/19/2019 Name: SHERYLE VICE MRN: [id number] DOB: 01-06-1950  NASIRAH SACHS is a 69 y.o. year old female who is a primary care patient of Chevis Pretty, [place]. I reached out to [name] by phone today in response to a referral sent by Ms. Darliss Ridgel Sachdeva's health plan.     Ms. Breon was given information about Chronic Care Management services today including:  1. CCM service includes personalized support from designated clinical staff supervised by her physician, including individualized plan of care and coordination with other care providers 2. 24/7 contact phone numbers for assistance for urgent and routine care needs. 3. Service will only be billed when office clinical staff spend 20 minutes or more in a month to coordinate care. 4. Only one practitioner may furnish and bill the service in a calendar month. 5. The patient may stop CCM services at any time (effective at the end of the month) by phone call to the office staff. 6. The patient will be responsible for cost sharing (co-pay) of up to 20% of the service fee (after annual deductible is met).  Patient agreed to services and verbal consent obtained.   Follow up plan: Telephone appointment with care management team member scheduled for: 07/16/2019  [facility]  [place], [place] [address] Direct Dial: [email or web address] Website: [email or web address]

## 2019-04-02 ENCOUNTER — Telehealth: Payer: Self-pay | Admitting: Physician Assistant

## 2019-04-02 NOTE — Telephone Encounter (Signed)
Patient calling to get pathology results.  Chart # [id number].

## 2019-04-02 NOTE — Telephone Encounter (Signed)
Pathology results given to patient.  

## 2019-07-16 ENCOUNTER — Ambulatory Visit: Payer: PPO | Admitting: *Deleted

## 2019-07-16 DIAGNOSIS — E785 Hyperlipidemia, unspecified: Secondary | ICD-10-CM

## 2019-07-16 DIAGNOSIS — I10 Essential (primary) hypertension: Secondary | ICD-10-CM

## 2019-07-16 NOTE — Patient Instructions (Signed)
  Plan:   CCM enrollment status changed to "previously enrolled" on 07/16/19 after discussion of needs.  Case closed to case management services in primary care home.   Advised that she may enroll again at any time  Appointment with PCP on 09/11/19  Chong Sicilian, BSN, RN-BC [place] [facility] [place] Direct Dial: [phone]

## 2019-07-16 NOTE — Chronic Care Management (AMB) (Signed)
  Chronic Care Management   Initial Visit Note  07/16/2019 Name: [name] MRN: [id number] DOB: 1950/08/30  Referred by: Chevis Pretty, FNP Reason for referral : Chronic Care Management (Initial Visit)   [name] is a 69 y.o. year old female who is a primary care patient of Chevis Pretty, [place]. The CCM team was consulted for assistance with chronic disease management and care coordination needs related to HTN, HLD and glaucoma  Review of patient status, including review of consultants reports, relevant laboratory and other test results, and collaboration with appropriate care team members and the patient's provider was performed as part of comprehensive patient evaluation and provision of chronic care management services.    I spoke with Ms Bickert by telephone today regarding management of her chronic medical conditions. She is able to manage her medical conditions well on her own and does not have any resource or other CCM needs at this time. She appreciated the call and evaluation but CCM services are not needed at this time.   SDOH (Social Determinants of Health) assessments performed: Yes See Care Plan activities for detailed interventions related to SDOH  SDOH Interventions     Most Recent Value  SDOH Interventions  Physical Activity Interventions Other (Comments)  [Patient enjoys cycling and was riding regularly up until a year ago. She plans to start again.]       Medications: Outpatient Encounter Medications as of 07/16/2019  Medication Sig  . ALPRAZolam (XANAX) 0.25 MG tablet Take 1 tablet (0.25 mg total) by mouth at bedtime as needed.  Marland Kitchen aspirin 81 MG chewable tablet Chew by mouth daily.  Marland Kitchen atorvastatin (LIPITOR) 20 MG tablet Take 1 tablet (20 mg total) by mouth daily at 6 PM.  . BIOTIN PO Take by mouth.  Marland Kitchen CALCIUM-MAGNESIUM PO Take by mouth.    . Cranberry 400 MG CAPS Take by mouth.  . Cyanocobalamin (VITAMIN B-12 PO) Take 2,500 mcg by mouth daily.   Marland Kitchen  LUMIGAN 0.01 % SOLN   . metoprolol succinate (TOPROL-XL) 50 MG 24 hr tablet Take 1 tablet (50 mg total) by mouth daily. Take with or immediately following a meal.  . pantoprazole (PROTONIX) 40 MG tablet TAKE ONE (1) TABLET EACH DAY  . pyridOXINE (VITAMIN B-6) 100 MG tablet Take 100 mg by mouth daily.   No facility-administered encounter medications on file as of 07/16/2019.    Lab Results  Component Value Date   CHOL 151 03/11/2019   HDL 48 03/11/2019   LDLCALC 82 03/11/2019   TRIG 119 03/11/2019   CHOLHDL 3.1 03/11/2019   BP Readings from Last 3 Encounters:  03/11/19 (!) 149/78  11/22/18 126/80  09/11/18 130/74   BMI Readings from Last 3 Encounters:  03/11/19 24.24 kg/m  11/22/18 24.64 kg/m  09/11/18 24.26 kg/m   Plan:   CCM enrollment status changed to "previously enrolled" on 07/16/19 after discussion of needs.  Case closed to case management services in primary care home.   Advised that she may enroll again at any time  Appointment with PCP on 09/11/19  Chong Sicilian, BSN, RN-BC [place] [facility] [place] [facility] Direct Dial: [phone]

## 2019-08-08 ENCOUNTER — Other Ambulatory Visit: Payer: Self-pay | Admitting: Nurse Practitioner

## 2019-08-08 DIAGNOSIS — K219 Gastro-esophageal reflux disease without esophagitis: Secondary | ICD-10-CM

## 2019-08-13 DIAGNOSIS — H40023 Open angle with borderline findings, high risk, bilateral: Secondary | ICD-10-CM | POA: Diagnosis not present

## 2019-09-11 ENCOUNTER — Encounter: Payer: Self-pay | Admitting: Nurse Practitioner

## 2019-09-11 ENCOUNTER — Ambulatory Visit (INDEPENDENT_AMBULATORY_CARE_PROVIDER_SITE_OTHER): Payer: PPO | Admitting: Nurse Practitioner

## 2019-09-11 DIAGNOSIS — K581 Irritable bowel syndrome with constipation: Secondary | ICD-10-CM

## 2019-09-11 DIAGNOSIS — M8580 Other specified disorders of bone density and structure, unspecified site: Secondary | ICD-10-CM

## 2019-09-11 DIAGNOSIS — I10 Essential (primary) hypertension: Secondary | ICD-10-CM | POA: Diagnosis not present

## 2019-09-11 DIAGNOSIS — E785 Hyperlipidemia, unspecified: Secondary | ICD-10-CM

## 2019-09-11 DIAGNOSIS — F411 Generalized anxiety disorder: Secondary | ICD-10-CM

## 2019-09-11 DIAGNOSIS — E559 Vitamin D deficiency, unspecified: Secondary | ICD-10-CM | POA: Diagnosis not present

## 2019-09-11 DIAGNOSIS — K219 Gastro-esophageal reflux disease without esophagitis: Secondary | ICD-10-CM

## 2019-09-11 MED ORDER — ALPRAZOLAM 0.25 MG PO TABS: 0.2500 mg | ORAL_TABLET | Freq: Every evening | ORAL | 5 refills | Status: DC | PRN

## 2019-09-11 MED ORDER — ATORVASTATIN CALCIUM 20 MG PO TABS: 20.0000 mg | ORAL_TABLET | Freq: Every day | ORAL | 1 refills | Status: DC

## 2019-09-11 MED ORDER — METOPROLOL SUCCINATE ER 50 MG PO TB24: 50.0000 mg | ORAL_TABLET | Freq: Every day | ORAL | 1 refills | Status: DC

## 2019-09-11 MED ORDER — PANTOPRAZOLE SODIUM 40 MG PO TBEC: 40.0000 mg | DELAYED_RELEASE_TABLET | Freq: Every day | ORAL | 1 refills | Status: DC

## 2019-09-11 NOTE — Progress Notes (Signed)
Patient ID: [name], female   DOB: 1950-07-05, 69 y.o.   MRN: [id number]    Virtual Visit via telephone Note Due to COVID-19 pandemic this visit was conducted virtually. This visit type was conducted due to national recommendations for restrictions regarding the COVID-19 Pandemic (e.g. social distancing, sheltering in place) in an effort to limit this patient's exposure and mitigate transmission in our community. All issues noted in this document were discussed and addressed.  A physical exam was not performed with this format.  I connected with [name] on 09/11/19 at 8:00 by telephone and verified that I am speaking with the correct person using two identifiers. [name] is currently located at home and her husband is currently with her during visit. The provider, Mary-Margaret Hassell Done, FNP is located in their office at time of visit.  I discussed the limitations, risks, security and privacy concerns of performing an evaluation and management service by telephone and the availability of in person appointments. I also discussed with the patient that there may be a patient responsible charge related to this service. The patient expressed understanding and agreed to proceed.   History and Present Illness:     Subjective:    Patient ID: [name], female    DOB: Jul 05, 1950, 69 y.o.   MRN: [id number]   Chief Complaint: Medical Management of Chronic Issues    HPI:  1. Essential hypertension No c/o chest pain, sob or headache. Does not check blood pressure at home. BP Readings from Last 3 Encounters:  03/11/19 (!) 149/78  11/22/18 126/80  09/11/18 130/74      2. Gastroesophageal reflux disease without esophagitis Is on protonix daily and is doing well.   3. Irritable bowel syndrome with constipation denies any recent flare ups.  4. Osteopenia, unspecified location Does daiily weight bearing exercise. Last dexascan was done on 03/14/18 with dx of osteopenia.   5.  Vitamin D deficiency Takes a dialy vitamin d supplement  6. Hyperlipidemia with target LDL less than 100 Does watch diet. Lab Results  Component Value Date   CHOL 151 03/11/2019   HDL 48 03/11/2019   LDLCALC 82 03/11/2019   TRIG 119 03/11/2019   CHOLHDL 3.1 03/11/2019       Outpatient Encounter Medications as of 09/11/2019  Medication Sig  . ALPRAZolam (XANAX) 0.25 MG tablet Take 1 tablet (0.25 mg total) by mouth at bedtime as needed.  Marland Kitchen aspirin 81 MG chewable tablet Chew by mouth daily.  Marland Kitchen atorvastatin (LIPITOR) 20 MG tablet Take 1 tablet (20 mg total) by mouth daily at 6 PM.  . BIOTIN PO Take by mouth.  Marland Kitchen CALCIUM-MAGNESIUM PO Take by mouth.    . Cranberry 400 MG CAPS Take by mouth.  . Cyanocobalamin (VITAMIN B-12 PO) Take 2,500 mcg by mouth daily.   Marland Kitchen LUMIGAN 0.01 % SOLN   . metoprolol succinate (TOPROL-XL) 50 MG 24 hr tablet Take 1 tablet (50 mg total) by mouth daily. Take with or immediately following a meal.  . pantoprazole (PROTONIX) 40 MG tablet TAKE ONE (1) TABLET EACH DAY  . pyridOXINE (VITAMIN B-6) 100 MG tablet Take 100 mg by mouth daily.     Past Surgical History:  Procedure Laterality Date  . CARDIAC CATHETERIZATION    . COLPOSCOPY    . excision of mole  2007  . GYNECOLOGIC CRYOSURGERY    . LAPAROSCOPIC CHOLECYSTECTOMY    . TUBAL LIGATION  1991    Family History  Problem Relation  Age of Onset  . Hypertension Mother   . Heart disease Mother   . Hyperlipidemia Mother   . Glaucoma Mother   . Hypertension Father   . Cancer Father        LUNG  . Hyperlipidemia Father   . Diabetes Sister   . Hypertension Sister   . Hyperlipidemia Sister   . Fibromyalgia Daughter   . Asthma Daughter     New complaints: None today  Social history: Her and husband were exposed to covid  Controlled substance contract: n/a  Review of Systems  Constitutional: Negative for diaphoresis and weight loss.  Eyes: Negative for blurred vision, double vision and pain.    Respiratory: Negative for shortness of breath.   Cardiovascular: Negative for chest pain, palpitations, orthopnea and leg swelling.  Gastrointestinal: Negative for abdominal pain.  Skin: Negative for rash.  Neurological: Negative for dizziness, sensory change, loss of consciousness, weakness and headaches.  Endo/Heme/Allergies: Negative for polydipsia. Does not bruise/bleed easily.  Psychiatric/Behavioral: Negative for memory loss. The patient does not have insomnia.   All other systems reviewed and are negative.    Observations/Objective: Alert and oriented- answers all questions appropriately No distress    Assessment and Plan: [name] comes in today with chief complaint of Medical Management of Chronic Issues   Diagnosis and orders addressed:  1. Essential hypertension Low sodium diet - metoprolol succinate (TOPROL-XL) 50 MG 24 hr tablet; Take 1 tablet (50 mg total) by mouth daily. Take with or immediately following a meal.  Dispense: 90 tablet; Refill: 1  2. Gastroesophageal reflux disease without esophagitis Avoid spicy foods Do not eat 2 hours prior to bedtime - pantoprazole (PROTONIX) 40 MG tablet; Take 1 tablet (40 mg total) by mouth daily.  Dispense: 90 tablet; Refill: 1  3. Irritable bowel syndrome with constipation Watch diet  4. Osteopenia, unspecified location Continue weight bearing exercises  5. Vitamin D deficiency Continue daily vitamind supplement  6. Hyperlipidemia with target LDL less than 100 Low fat deit - atorvastatin (LIPITOR) 20 MG tablet; Take 1 tablet (20 mg total) by mouth daily at 6 PM.  Dispense: 90 tablet; Refill: 1  7. GAD (generalized anxiety disorder) stres management - ALPRAZolam (XANAX) 0.25 MG tablet; Take 1 tablet (0.25 mg total) by mouth at bedtime as needed.  Dispense: 30 tablet; Refill: 5   Labs pending Health Maintenance reviewed Diet and exercise encouraged  Follow up plan: 3 months    I discussed the  assessment and treatment plan with the patient. The patient was provided an opportunity to ask questions and all were answered. The patient agreed with the plan and demonstrated an understanding of the instructions.   The patient was advised to call back or seek an in-person evaluation if the symptoms worsen or if the condition fails to improve as anticipated.  The above assessment and management plan was discussed with the patient. The patient verbalized understanding of and has agreed to the management plan. Patient is aware to call the clinic if symptoms persist or worsen. Patient is aware when to return to the clinic for a follow-up visit. Patient educated on when it is appropriate to go to the emergency department.   Time call ended:  8:18  I provided 18 minutes of non-face-to-face time during this encounter.    Mary-Margaret Hassell Done, FNP

## 2019-10-16 DIAGNOSIS — Z23 Encounter for immunization: Secondary | ICD-10-CM | POA: Diagnosis not present

## 2019-10-25 ENCOUNTER — Ambulatory Visit (INDEPENDENT_AMBULATORY_CARE_PROVIDER_SITE_OTHER): Payer: PPO | Admitting: Nurse Practitioner

## 2019-10-25 DIAGNOSIS — Z20822 Contact with and (suspected) exposure to covid-19: Secondary | ICD-10-CM

## 2019-10-25 NOTE — Progress Notes (Signed)
   Virtual Visit via telephone Note Due to COVID-19 pandemic this visit was conducted virtually. This visit type was conducted due to national recommendations for restrictions regarding the COVID-19 Pandemic (e.g. social distancing, sheltering in place) in an effort to limit this patient's exposure and mitigate transmission in our community. All issues noted in this document were discussed and addressed.  A physical exam was not performed with this format.  I connected with [name] on 10/25/19 at 3:30 by telephone and verified that I am speaking with the correct person using two identifiers. [name] is currently located at home and her husband and granddaughter is with her is currently with her during visit. The provider, Mary-Margaret Hassell Done, FNP is located in their office at time of visit.  I discussed the limitations, risks, security and privacy concerns of performing an evaluation and management service by telephone and the availability of in person appointments. I also discussed with the patient that there may be a patient responsible charge related to this service. The patient expressed understanding and agreed to proceed.   History and Present Illness:   Chief Complaint: URI   HPI Patient calls in stating that her husband has covid on Wednesday morning  and has had "infusion". She is c/o slight nasal congestion, but no worse then her allergies usually are. She is a little fatigued. But other then that she feels ok. She still has her taste and smell, denies body aches. Denies fever.    Review of Systems  Constitutional: Negative for chills and fever.  HENT: Negative for congestion and sinus pain.   Respiratory: Negative for cough.   Musculoskeletal: Negative for myalgias.  Neurological: Negative for dizziness and headaches.  All other systems reviewed and are negative.    Observations/Objective: Alert and oriented- answers all questions appropriately No  distress    Assessment and Plan: [name] in today with chief complaint of URI   1. Close exposure to COVID-19 virus [employer] fluids Report any symptoms covid education given   Follow Up Instructions: prn    I discussed the assessment and treatment plan with the patient. The patient was provided an opportunity to ask questions and all were answered. The patient agreed with the plan and demonstrated an understanding of the instructions.   The patient was advised to call back or seek an in-person evaluation if the symptoms worsen or if the condition fails to improve as anticipated.  The above assessment and management plan was discussed with the patient. The patient verbalized understanding of and has agreed to the management plan. Patient is aware to call the clinic if symptoms persist or worsen. Patient is aware when to return to the clinic for a follow-up visit. Patient educated on when it is appropriate to go to the emergency department.   Time call ended:  3:45  I provided 15 minutes of non-face-to-face time during this encounter.    Mary-Margaret Hassell Done, FNP

## 2019-11-04 ENCOUNTER — Other Ambulatory Visit: Payer: Self-pay | Admitting: Nurse Practitioner

## 2019-11-04 DIAGNOSIS — Z1231 Encounter for screening mammogram for malignant neoplasm of breast: Secondary | ICD-10-CM

## 2019-11-21 ENCOUNTER — Other Ambulatory Visit: Payer: Self-pay

## 2019-11-21 ENCOUNTER — Ambulatory Visit (INDEPENDENT_AMBULATORY_CARE_PROVIDER_SITE_OTHER): Payer: PPO

## 2019-11-21 ENCOUNTER — Ambulatory Visit (INDEPENDENT_AMBULATORY_CARE_PROVIDER_SITE_OTHER): Payer: PPO | Admitting: *Deleted

## 2019-11-21 DIAGNOSIS — Z23 Encounter for immunization: Secondary | ICD-10-CM

## 2019-11-21 DIAGNOSIS — Z0000 Encounter for general adult medical examination without abnormal findings: Secondary | ICD-10-CM | POA: Diagnosis not present

## 2019-11-21 NOTE — Progress Notes (Signed)
MEDICARE ANNUAL WELLNESS VISIT  11/21/2019  Telephone Visit Disclaimer This Medicare AWV was conducted by telephone due to national recommendations for restrictions regarding the COVID-19 Pandemic (e.g. social distancing).  I verified, using two identifiers, that I am speaking with [name] or their authorized healthcare agent. I discussed the limitations, risks, security, and privacy concerns of performing an evaluation and management service by telephone and the potential availability of an in-person appointment in the future. The patient expressed understanding and agreed to proceed.  Location of Patient: home Location of Provider (nurse): office  Subjective:    [name] is a 69 y.o. female patient of [name], [name] who had a Medicare Annual Wellness Visit today via telephone. [name] is Retired and lives with their spouse. she has one child. she reports that she is socially active and does interact with friends/family regularly. she is not physically active and enjoys antiquing .  Patient Care Team: [name] Pretty, FNP as PCP - General (Family Medicine) [facility], [facility] (Dermatology) [name], [name] Block, MD (Inactive) as Consulting Physician (Gynecology) Imaging, [facility] (Diagnostic Radiology) [name] Schmidt, MD as Consulting Physician (Ophthalmology)  Advanced Directives 11/21/2019 11/20/2018 11/15/2017 10/19/2016  Does Patient Have a Medical Advance Directive? No No No No  Would patient like information on creating a medical advance directive? No - Patient declined No - Patient declined No - Patient declined Yes (MAU/Ambulatory/Procedural Areas - Information given)    Hospital Utilization Over the Past 12 Months: # of hospitalizations or ER visits: 0 # of surgeries: 0  Review of Systems    Patient reports that her overall health is unchanged compared to last year.  History obtained from chart review and the  patient  Patient Reported Readings (BP, Pulse, CBG, Weight, etc) none  Pain Assessment Pain : No/denies pain     Current Medications & Allergies (verified) Allergies as of 11/21/2019      Reactions   Codeine    Sulfonamide Derivatives    REACTION: severe GI upset      Medication List       Accurate as of November 21, 2019 10:57 AM. If you have any questions, ask your nurse or doctor.        ALPRAZolam 0.25 MG tablet Commonly known as: XANAX Take 1 tablet (0.25 mg total) by mouth at bedtime as needed.   aspirin 81 MG chewable tablet Chew by mouth daily.   atorvastatin 20 MG tablet Commonly known as: LIPITOR Take 1 tablet (20 mg total) by mouth daily at 6 PM.   BIOTIN PO Take by mouth.   CALCIUM-MAGNESIUM PO Take by mouth.   Cranberry 400 MG Caps Take by mouth.   Lumigan 0.01 % Soln Generic drug: bimatoprost   metoprolol succinate 50 MG 24 hr tablet Commonly known as: TOPROL-XL Take 1 tablet (50 mg total) by mouth daily. Take with or immediately following a meal.   pantoprazole 40 MG tablet Commonly known as: PROTONIX Take 1 tablet (40 mg total) by mouth daily.   pyridOXINE 100 MG tablet Commonly known as: VITAMIN B-6 Take 100 mg by mouth daily.   VITAMIN B-12 PO Take 2,500 mcg by mouth daily.       History (reviewed): Past Medical History:  Diagnosis Date  . Allergic rhinitis   . Cerumen impaction    right   . Chest discomfort   . Chronic constipation   . Diverticulosis   . GERD (gastroesophageal reflux disease)   .  Glaucoma   . Hyperlipidemia   . Kidney stones   . Nasal congestion   . Osteopenia 03/2018   T score -2.0 FRAX 12% / 2.2% stable from prior DEXA  . Palpitation   . PUD (peptic ulcer disease)   . Scoliosis   . Sinusitis   . Vitamin D deficiency    Past Surgical History:  Procedure Laterality Date  . CARDIAC CATHETERIZATION    . COLPOSCOPY    . excision of mole  2007  . GYNECOLOGIC CRYOSURGERY    . LAPAROSCOPIC  CHOLECYSTECTOMY    . TUBAL LIGATION  1991   Family History  Problem Relation Age of Onset  . Hypertension Mother   . Heart disease Mother   . Hyperlipidemia Mother   . Glaucoma Mother   . Hypertension Father   . Cancer Father        LUNG  . Hyperlipidemia Father   . Diabetes Sister   . Hypertension Sister   . Hyperlipidemia Sister   . Fibromyalgia Daughter   . Asthma Daughter    Social History   Socioeconomic History  . Marital status: Married    Spouse name: [name]  . Number of children: 1  . Years of education: 3  . Highest education level: Associate degree: occupational, Hotel manager, or vocational program  Occupational History  . Occupation: Retired    Fish farm manager: [employer]  Tobacco Use  . Smoking status: Never Smoker  . Smokeless tobacco: Never Used  Vaping Use  . Vaping Use: Never used  Substance and Sexual Activity  . Alcohol use: Yes    Alcohol/week: 0.0 standard drinks    Comment: Rare  . Drug use: No  . Sexual activity: Yes    Birth control/protection: Post-menopausal, Surgical    Comment: Tubal lig-1st intercourse 69 yo-Fewer than 5 partners  Other Topics Concern  . Not on file  Social History Narrative  . Not on file   Social Determinants of Health   Financial Resource Strain: Low Risk   . Difficulty of Paying Living Expenses: Not hard at all  Food Insecurity: No Food Insecurity  . Worried About Charity fundraiser in the Last Year: Never true  . Ran Out of Food in the Last Year: Never true  Transportation Needs: No Transportation Needs  . Lack of Transportation (Medical): No  . Lack of Transportation (Non-Medical): No  Physical Activity: Inactive  . Days of Exercise per Week: 0 days  . Minutes of Exercise per Session: 0 min  Stress: No Stress Concern Present  . Feeling of Stress : Not at all  Social Connections: Socially Integrated  . Frequency of Communication with Friends and Family: More than three times a week  . Frequency of Social  Gatherings with Friends and Family: More than three times a week  . Attends Religious Services: 1 to 4 times per year  . Active Member of Clubs or Organizations: Yes  . Attends Archivist Meetings: Never  . Marital Status: Married    Activities of Daily Living In your present state of health, do you have any difficulty performing the following activities: 11/21/2019 07/16/2019  Hearing? N Y  Comment - has some difficulty with hearing  Vision? Y N  Comment [place] -  Difficulty concentrating or making decisions? N N  Walking or climbing stairs? N N  Dressing or bathing? N N  Doing errands, shopping? N N  Preparing Food and eating ? N N  Using the Toilet? N N  In the past six months, have you accidently leaked urine? N N  Do you have problems with loss of bowel control? N N  Managing your Medications? N N  Managing your Finances? N N  Housekeeping or managing your Housekeeping? N N  Some recent data might be hidden    Patient Education/ Literacy How often do you need to have someone help you when you read instructions, pamphlets, or other written materials from your doctor or pharmacy?: 1 - Never What is the last grade level you completed in school?: 14  Exercise Current Exercise Habits: The patient does not participate in regular exercise at present, Exercise limited by: None identified  Diet Patient reports consuming 2 meals a day and 1 snack(s) a day Patient reports that her primary diet is: Regular Patient reports that she does have regular access to food.   Depression Screen PHQ 2/9 Scores 11/21/2019 11/21/2019 03/11/2019 11/20/2018 09/11/2018 06/15/2018 03/08/2018  PHQ - 2 Score 0 0 0 0 0 0 0  PHQ- 9 Score - - - - 0 - -     Fall Risk Fall Risk  11/21/2019 03/11/2019 11/20/2018 09/11/2018 06/15/2018  Falls in the past year? 0 0 0 1 0  Number falls in past yr: - - - 0 -  Injury with Fall? - - - 0 -  Follow up - - - - -     Objective:  [name] seemed alert  and oriented and she participated appropriately during our telephone visit.  Blood Pressure Weight BMI  BP Readings from Last 3 Encounters:  03/11/19 (!) 149/78  11/22/18 126/80  09/11/18 130/74   Wt Readings from Last 3 Encounters:  03/11/19 120 lb (54.4 kg)  11/22/18 122 lb (55.3 kg)  09/11/18 120 lb 2 oz (54.5 kg)   BMI Readings from Last 1 Encounters:  03/11/19 24.24 kg/m    *Unable to obtain current vital signs, weight, and BMI due to telephone visit type  Hearing/Vision  . Harlowe did not seem to have difficulty with hearing/understanding during the telephone conversation . Reports that she has had a formal eye exam by an eye care professional within the past year . Reports that she has not had a formal hearing evaluation within the past year *Unable to fully assess hearing and vision during telephone visit type  Cognitive Function: 6CIT Screen 11/21/2019 11/20/2018  What Year? 0 points 0 points  What month? 0 points 0 points  What time? 0 points 0 points  Count back from 20 0 points 0 points  Months in reverse 0 points 0 points  Repeat phrase 0 points 0 points  Total Score 0 0   (Normal:0-7, Significant for Dysfunction: >8)  Normal Cognitive Function Screening: Yes   Immunization & Health Maintenance Record Immunization History  Administered Date(s) Administered  . Fluad Quad(high Dose 65+) 09/11/2018, 11/21/2019  . Influenza, High Dose Seasonal PF 10/19/2016, 10/09/2017  . Influenza,inj,Quad PF,6+ Mos 10/14/2015  . Influenza-Unspecified 11/18/2014  . Moderna SARS-COVID-2 Vaccination 02/13/2019, 03/12/2019, 10/16/2019  . Pneumococcal Conjugate-13 07/11/2014  . Pneumococcal Polysaccharide-23 07/21/2015  . Tdap 05/14/2011  . Zoster 04/04/2011    Health Maintenance  Topic Date Due  . COLONOSCOPY  10/22/2019  . MAMMOGRAM  11/21/2020  . TETANUS/TDAP  05/13/2021  . INFLUENZA VACCINE  Completed  . DEXA SCAN  Completed  . COVID-19 Vaccine  Completed  .  Hepatitis C Screening  Completed  . PNA vac Low Risk Adult  Completed  Assessment  This is a routine wellness examination for [name].  Health Maintenance: Due or Overdue Health Maintenance Due  Topic Date Due  . COLONOSCOPY  10/22/2019    [name] does not need a referral for Community Assistance: Care Management:   no Social Work:    no Prescription Assistance:  no Nutrition/Diabetes Education:  no   Plan:  Personalized Goals Goals Addressed            This Visit's Progress   . Increase physical activity       Pt was walking, plans ro start back      Personalized Health Maintenance & Screening Recommendations  Colorectal cancer screening shingles vaccine  Lung Cancer Screening Recommended: no (Low Dose CT Chest recommended if Age 51-80 years, 30 pack-year currently smoking OR have quit w/in past 15 years) Hepatitis C Screening recommended: no HIV Screening recommended: no  Advanced Directives: Written information was not prepared per patient's request.  Referrals & Orders No orders of the defined types were placed in this encounter.   Follow-up Plan . Follow-up with [name] Pretty, FNP as planned 12/12/19. Marland Kitchen Pt is due colonoscopy. . Offer shingles vaccine. . Covid and booster taken. . Pt declined information on Advanced Directives. Marland Kitchen Hearing and vision are good. Pt had eye exam in June 2021. Marland Kitchen Pt remains independent with all ADL's. . Pt voices not healthcare concerns today. . AVS printed and mailed.    I have personally reviewed and noted the following in the patient's chart:   . Medical and social history . Use of alcohol, tobacco or illicit drugs  . Current medications and supplements . Functional ability and status . Nutritional status . Physical activity . Advanced directives . List of other physicians . Hospitalizations, surgeries, and ER visits in previous 12 months . Vitals . Screenings to include cognitive,  depression, and falls . Referrals and appointments  In addition, I have reviewed and discussed with [name] certain preventive protocols, quality metrics, and best practice recommendations. A written personalized care plan for preventive services as well as general preventive health recommendations is available and can be mailed to the patient at her request.      Rana Snare, LPN  47/09/[id number]

## 2019-12-10 ENCOUNTER — Ambulatory Visit: Payer: PPO

## 2019-12-11 ENCOUNTER — Ambulatory Visit
Admission: RE | Admit: 2019-12-11 | Discharge: 2019-12-11 | Disposition: A | Discharge status: Home / Self Care | Payer: PPO | Source: Ambulatory Visit | Attending: Nurse Practitioner | Admitting: Nurse Practitioner

## 2019-12-11 ENCOUNTER — Other Ambulatory Visit: Payer: Self-pay

## 2019-12-11 DIAGNOSIS — Z1231 Encounter for screening mammogram for malignant neoplasm of breast: Secondary | ICD-10-CM | POA: Diagnosis not present

## 2019-12-12 ENCOUNTER — Other Ambulatory Visit: Payer: Self-pay

## 2019-12-12 ENCOUNTER — Encounter: Payer: Self-pay | Admitting: Nurse Practitioner

## 2019-12-12 ENCOUNTER — Ambulatory Visit (INDEPENDENT_AMBULATORY_CARE_PROVIDER_SITE_OTHER): Payer: PPO | Admitting: Nurse Practitioner

## 2019-12-12 VITALS — BP 112/72 | HR 63 | Temp 97.0°F | Resp 20 | Ht 59.0 in | Wt 120.0 lb

## 2019-12-12 DIAGNOSIS — K219 Gastro-esophageal reflux disease without esophagitis: Secondary | ICD-10-CM | POA: Diagnosis not present

## 2019-12-12 DIAGNOSIS — E785 Hyperlipidemia, unspecified: Secondary | ICD-10-CM

## 2019-12-12 DIAGNOSIS — K581 Irritable bowel syndrome with constipation: Secondary | ICD-10-CM | POA: Diagnosis not present

## 2019-12-12 DIAGNOSIS — M8580 Other specified disorders of bone density and structure, unspecified site: Secondary | ICD-10-CM

## 2019-12-12 DIAGNOSIS — I10 Essential (primary) hypertension: Secondary | ICD-10-CM | POA: Diagnosis not present

## 2019-12-12 DIAGNOSIS — E559 Vitamin D deficiency, unspecified: Secondary | ICD-10-CM

## 2019-12-12 MED ORDER — PANTOPRAZOLE SODIUM 40 MG PO TBEC: 40.0000 mg | DELAYED_RELEASE_TABLET | Freq: Every day | ORAL | 1 refills | Status: DC

## 2019-12-12 MED ORDER — ATORVASTATIN CALCIUM 20 MG PO TABS: 20.0000 mg | ORAL_TABLET | Freq: Every day | ORAL | 1 refills | Status: DC

## 2019-12-12 MED ORDER — METOPROLOL SUCCINATE ER 50 MG PO TB24: 50.0000 mg | ORAL_TABLET | Freq: Every day | ORAL | 1 refills | Status: DC

## 2019-12-12 NOTE — Patient Instructions (Signed)

## 2019-12-12 NOTE — Progress Notes (Signed)
Subjective:    Patient ID: [name], female    DOB: 1950/08/31, 69 y.o.   MRN: [id number]   Chief Complaint: Medical Management of Chronic Issues    HPI:  1. Essential hypertension No c/o chest pain, sob or headaches. Does check her blood pressure at home. Runs in 650 systolic. BP Readings from Last 3 Encounters:  12/12/19 112/72  03/11/19 (!) 149/78  11/22/18 126/80     2. Hyperlipidemia with target LDL less than 100 She does not watch diet and does very little exxercise. Lab Results  Component Value Date   CHOL 151 03/11/2019   HDL 48 03/11/2019   LDLCALC 82 03/11/2019   TRIG 119 03/11/2019   CHOLHDL 3.1 03/11/2019     3. Gastroesophageal reflux disease without esophagitis Is on protoinix daily and is doing well  4. Irritable bowel syndrome with constipation She started a probioic daily and that has really helped keep her regular.  5. Osteopenia, unspecified location Last dexascan was done at GYN office. Showed osteopenia in 2020.  6. Vitamin D deficiency Takes daily vitamin d supplement    Outpatient Encounter Medications as of 12/12/2019  Medication Sig  . ALPRAZolam (XANAX) 0.25 MG tablet Take 1 tablet (0.25 mg total) by mouth at bedtime as needed.  Marland Kitchen aspirin 81 MG chewable tablet Chew by mouth daily.  Marland Kitchen atorvastatin (LIPITOR) 20 MG tablet Take 1 tablet (20 mg total) by mouth daily at 6 PM.  . BIOTIN PO Take by mouth.  Marland Kitchen CALCIUM-MAGNESIUM PO Take by mouth.  . Cranberry 400 MG CAPS Take by mouth.  . Cyanocobalamin (VITAMIN B-12 PO) Take 2,500 mcg by mouth daily.  Marland Kitchen LUMIGAN 0.01 % SOLN   . metoprolol succinate (TOPROL-XL) 50 MG 24 hr tablet Take 1 tablet (50 mg total) by mouth daily. Take with or immediately following a meal.  . pantoprazole (PROTONIX) 40 MG tablet Take 1 tablet (40 mg total) by mouth daily.  Marland Kitchen pyridOXINE (VITAMIN B-6) 100 MG tablet Take 100 mg by mouth daily.     Past Surgical History:  Procedure Laterality Date  . CARDIAC  CATHETERIZATION    . COLPOSCOPY    . excision of mole  2007  . GYNECOLOGIC CRYOSURGERY    . LAPAROSCOPIC CHOLECYSTECTOMY    . TUBAL LIGATION  1991    Family History  Problem Relation Age of Onset  . Hypertension Mother   . Heart disease Mother   . Hyperlipidemia Mother   . Glaucoma Mother   . Hypertension Father   . Cancer Father        LUNG  . Hyperlipidemia Father   . Diabetes Sister   . Hypertension Sister   . Hyperlipidemia Sister   . Fibromyalgia Daughter   . Asthma Daughter     New complaints: None today  Social history: Lives with husband  Controlled substance contract: n/a    Review of Systems  Constitutional: Negative for diaphoresis.  Eyes: Negative for pain.  Respiratory: Negative for shortness of breath.   Cardiovascular: Negative for chest pain, palpitations and leg swelling.  Gastrointestinal: Negative for abdominal pain.  Endocrine: Negative for polydipsia.  Skin: Negative for rash.  Neurological: Negative for dizziness, weakness and headaches.  Hematological: Does not bruise/bleed easily.  All other systems reviewed and are negative.      Objective:   Physical Exam Vitals and nursing note reviewed.  Constitutional:      General: She is not in acute distress.    Appearance: Normal  appearance. She is well-developed and well-nourished.  HENT:     Head: Normocephalic.     Nose: Nose normal.     Mouth/Throat:     Mouth: Oropharynx is clear and moist.  Eyes:     Extraocular Movements: EOM normal.     Pupils: Pupils are equal, round, and reactive to light.  Neck:     Vascular: No carotid bruit or JVD.  Cardiovascular:     Rate and Rhythm: Normal rate and regular rhythm.     Pulses: Intact distal pulses.     Heart sounds: Normal heart sounds.  Pulmonary:     Effort: Pulmonary effort is normal. No respiratory distress.     Breath sounds: Normal breath sounds. No wheezing or rales.  Chest:     Chest wall: No tenderness.  Abdominal:      General: Bowel sounds are normal. There is no distension or abdominal bruit. Aorta is normal.     Palpations: Abdomen is soft. There is no hepatomegaly, splenomegaly, mass or pulsatile mass.     Tenderness: There is no abdominal tenderness.  Musculoskeletal:        General: No edema. Normal range of motion.     Cervical back: Normal range of motion and neck supple.  Lymphadenopathy:     Cervical: No cervical adenopathy.  Skin:    General: Skin is warm and dry.  Neurological:     Mental Status: She is alert and oriented to person, place, and time.     Deep Tendon Reflexes: Reflexes are normal and symmetric.  Psychiatric:        Mood and Affect: Mood and affect normal.        Behavior: Behavior normal.        Thought Content: Thought content normal.        Judgment: Judgment normal.     BP 112/72   Pulse 63   Temp (!) 97 F (36.1 C) (Temporal)   Resp 20   Ht _0  (1.499 m)   Wt 120 lb (54.4 kg)   SpO2 98%   BMI 24.24 kg/m        Assessment & Plan:  [name] comes in today with chief complaint of Medical Management of Chronic Issues   Diagnosis and orders addressed:  1. Essential hypertension Low sodium diet - metoprolol succinate (TOPROL-XL) 50 MG 24 hr tablet; Take 1 tablet (50 mg total) by mouth daily. Take with or immediately following a meal.  Dispense: 90 tablet; Refill: 1 - CBC with Differential/Platelet - CMP14+EGFR  2. Hyperlipidemia with target LDL less than 100 Low fat diet - atorvastatin (LIPITOR) 20 MG tablet; Take 1 tablet (20 mg total) by mouth daily at 6 PM.  Dispense: 90 tablet; Refill: 1 - Lipid panel  3. Gastroesophageal reflux disease without esophagitis Avoid spicy foods Do not eat 2 hours prior to bedtime - pantoprazole (PROTONIX) 40 MG tablet; Take 1 tablet (40 mg total) by mouth daily.  Dispense: 90 tablet; Refill: 1  4. Irritable bowel syndrome with constipation continue probiotic  5. Osteopenia, unspecified location Weight  bearing exercises  6. Vitamin D deficiency Continue daily vitamin d supplement   Labs pending Health Maintenance reviewed Diet and exercise encouraged  Follow up plan: 6 months   Mary-Margaret Hassell Done, FNP

## 2019-12-13 LAB — CBC WITH DIFFERENTIAL/PLATELET
Basophils Absolute: 0 10*3/uL (ref 0.0–0.2)
Basos: 1 %
EOS (ABSOLUTE): 0.3 10*3/uL (ref 0.0–0.4)
Eos: 4 %
Hematocrit: 42.4 % (ref 34.0–46.6)
Hemoglobin: 14.2 g/dL (ref 11.1–15.9)
Immature Grans (Abs): 0 10*3/uL (ref 0.0–0.1)
Immature Granulocytes: 0 %
Lymphocytes Absolute: 3.5 10*3/uL — ABNORMAL HIGH (ref 0.7–3.1)
Lymphs: 42 %
MCH: 30.1 pg (ref 26.6–33.0)
MCHC: 33.5 g/dL (ref 31.5–35.7)
MCV: 90 fL (ref 79–97)
Monocytes Absolute: 0.6 10*3/uL (ref 0.1–0.9)
Monocytes: 7 %
Neutrophils Absolute: 4 10*3/uL (ref 1.4–7.0)
Neutrophils: 46 %
Platelets: 238 10*3/uL (ref 150–450)
RBC: 4.71 x10E6/uL (ref 3.77–5.28)
RDW: 13.3 % (ref 11.7–15.4)
WBC: 8.5 10*3/uL (ref 3.4–10.8)

## 2019-12-13 LAB — CMP14+EGFR
ALT: 31 IU/L (ref 0–32)
AST: 28 IU/L (ref 0–40)
Albumin/Globulin Ratio: 1.7 (ref 1.2–2.2)
Albumin: 4.5 g/dL (ref 3.8–4.8)
Alkaline Phosphatase: 99 IU/L (ref 44–121)
BUN/Creatinine Ratio: 16 (ref 12–28)
BUN: 14 mg/dL (ref 8–27)
Bilirubin Total: 0.3 mg/dL (ref 0.0–1.2)
CO2: 28 mmol/L (ref 20–29)
Calcium: 10.1 mg/dL (ref 8.7–10.3)
Chloride: 102 mmol/L (ref 96–106)
Creatinine, Ser: 0.87 mg/dL (ref 0.57–1.00)
GFR calc Af Amer: 78 mL/min/{1.73_m2} (ref >59)
GFR calc non Af Amer: 68 mL/min/{1.73_m2} (ref >59)
Globulin, Total: 2.6 g/dL (ref 1.5–4.5)
Glucose: 78 mg/dL (ref 65–99)
Potassium: 4.1 mmol/L (ref 3.5–5.2)
Sodium: 141 mmol/L (ref 134–144)
Total Protein: 7.1 g/dL (ref 6.0–8.5)

## 2019-12-13 LAB — LIPID PANEL
Chol/HDL Ratio: 3.7 ratio (ref 0.0–4.4)
Cholesterol, Total: 169 mg/dL (ref 100–199)
HDL: 46 mg/dL (ref >39)
LDL Chol Calc (NIH): 96 mg/dL (ref 0–99)
Triglycerides: 155 mg/dL — ABNORMAL HIGH (ref 0–149)
VLDL Cholesterol Cal: 27 mg/dL (ref 5–40)

## 2020-02-03 ENCOUNTER — Telehealth: Payer: Self-pay | Admitting: Physician Assistant

## 2020-02-03 NOTE — Telephone Encounter (Signed)
Pathology to patient.  °

## 2020-02-03 NOTE — Telephone Encounter (Signed)
Patient is calling for pathology results from 03/14/2019 visit with [facility], PA-C.  Patient cannot remember if she got them earlier.

## 2020-02-13 ENCOUNTER — Encounter: Payer: Self-pay | Admitting: Gastroenterology

## 2020-02-18 DIAGNOSIS — H5213 Myopia, bilateral: Secondary | ICD-10-CM | POA: Diagnosis not present

## 2020-02-18 DIAGNOSIS — H401131 Primary open-angle glaucoma, bilateral, mild stage: Secondary | ICD-10-CM | POA: Diagnosis not present

## 2020-03-03 ENCOUNTER — Encounter: Payer: Self-pay | Admitting: Nurse Practitioner

## 2020-03-03 ENCOUNTER — Ambulatory Visit (INDEPENDENT_AMBULATORY_CARE_PROVIDER_SITE_OTHER): Payer: PPO | Admitting: Nurse Practitioner

## 2020-03-03 DIAGNOSIS — J0100 Acute maxillary sinusitis, unspecified: Secondary | ICD-10-CM

## 2020-03-03 MED ORDER — AMOXICILLIN-POT CLAVULANATE 875-125 MG PO TABS: 1.0000 | ORAL_TABLET | Freq: Two times a day (BID) | ORAL | 0 refills | Status: DC

## 2020-03-03 NOTE — Progress Notes (Signed)
Virtual Visit via telephone Note Due to COVID-19 pandemic this visit was conducted virtually. This visit type was conducted due to national recommendations for restrictions regarding the COVID-19 Pandemic (e.g. social distancing, sheltering in place) in an effort to limit this patient's exposure and mitigate transmission in our community. All issues noted in this document were discussed and addressed.  A physical exam was not performed with this format.  I connected with [name] on 03/03/20 at 12:30 by telephone and verified that I am speaking with the correct person using two identifiers. [name] is currently located at home and hr husband is currently with her during visit. The provider, Mary-Margaret Hassell Done, FNP is located in their office at time of visit.  I discussed the limitations, risks, security and privacy concerns of performing an evaluation and management service by telephone and the availability of in person appointments. I also discussed with the patient that there may be a patient responsible charge related to this service. The patient expressed understanding and agreed to proceed.   History and Present Illness:   Chief Complaint: Sinusitis   HPI Patient calls in c/o cough and congestion. Started several days ago. Thought was just allergies but is now having facial pressure with cough. Home covid test was negative.  * husband also sick and he tested negative for covid as well.  Review of Systems  Constitutional: Positive for malaise/fatigue. Negative for chills and fever.  HENT: Positive for congestion and sinus pain. Negative for ear pain and sore throat.   Respiratory: Positive for cough.   Musculoskeletal: Negative for myalgias.  Neurological: Positive for headaches.     Observations/Objective: Alert and oriented- answers all questions appropriately No distress Voice hoarse   Assessment and Plan: [name] in today with chief complaint of  Sinusitis   1. Acute non-recurrent maxillary sinusitis *1. Take meds as prescribed 2. Use a cool mist humidifier especially during the winter months and when heat has been humid. 3. Use saline nose sprays frequently 4. Saline irrigations of the nose can be very helpful if done frequently.  * 4X daily for 1 week*  * Use of a nettie pot can be helpful with this. Follow directions with this* 5. Drink plenty of fluids 6. Keep thermostat turn down low 7.For any cough or congestion  Use plain Mucinex- regular strength or max strength is fine   * Children- consult with Pharmacist for dosing 8. For fever or aces or pains- take tylenol or ibuprofen appropriate for age and weight.  * for fevers greater than 101 orally you may alternate ibuprofen and tylenol every  3 hours.   Meds ordered this encounter  Medications  . amoxicillin-clavulanate (AUGMENTIN) 875-125 MG tablet    Sig: Take 1 tablet by mouth 2 (two) times daily.    Dispense:  14 tablet    Refill:  0    Order Specific Question:   Supervising Provider    Answer:   Caryl Pina A [[id number]]         Follow Up Instructions: prn    I discussed the assessment and treatment plan with the patient. The patient was provided an opportunity to ask questions and all were answered. The patient agreed with the plan and demonstrated an understanding of the instructions.   The patient was advised to call back or seek an in-person evaluation if the symptoms worsen or if the condition fails to improve as anticipated.  The above assessment and management plan was  discussed with the patient. The patient verbalized understanding of and has agreed to the management plan. Patient is aware to call the clinic if symptoms persist or worsen. Patient is aware when to return to the clinic for a follow-up visit. Patient educated on when it is appropriate to go to the emergency department.   Time call ended:  12:42  I provided 12 minutes of  non-face-to-face time during this encounter.    Mary-Margaret Hassell Done, FNP

## 2020-04-13 ENCOUNTER — Ambulatory Visit: Payer: PPO | Admitting: Physician Assistant

## 2020-04-13 ENCOUNTER — Encounter: Payer: Self-pay | Admitting: Physician Assistant

## 2020-04-13 ENCOUNTER — Other Ambulatory Visit: Payer: Self-pay

## 2020-04-13 DIAGNOSIS — D224 Melanocytic nevi of scalp and neck: Secondary | ICD-10-CM

## 2020-04-13 DIAGNOSIS — Z1283 Encounter for screening for malignant neoplasm of skin: Secondary | ICD-10-CM | POA: Diagnosis not present

## 2020-04-13 DIAGNOSIS — D485 Neoplasm of uncertain behavior of skin: Secondary | ICD-10-CM

## 2020-04-13 DIAGNOSIS — Z86018 Personal history of other benign neoplasm: Secondary | ICD-10-CM

## 2020-04-13 NOTE — Patient Instructions (Signed)

## 2020-04-13 NOTE — Progress Notes (Signed)
   Follow-Up Visit   Subjective  [name] is a 70 y.o. female who presents for the following: Annual Exam (History of atypical moles,  no concerns today).   The following portions of the chart were reviewed this encounter and updated as appropriate:  Tobacco  Allergies  Meds  Problems  Med Hx  Surg Hx  Fam Hx      Objective  Well appearing patient in no apparent distress; mood and affect are within normal limits.  A full examination was performed including scalp, head, eyes, ears, nose, lips, neck, chest, axillae, abdomen, back, buttocks, bilateral upper extremities, bilateral lower extremities, hands, feet, fingers, toes, fingernails, and toenails. All findings within normal limits unless otherwise noted below.  Objective  Left Hip: No atypical nevi or signs of NMSC noted at the time of the visit.   Objective  Right Anterior Neck: Flat dense plaque   Assessment & Plan  Encounter for screening for malignant neoplasm of skin Left Hip  Yearly skin check  Neoplasm of uncertain behavior of skin Right Anterior Neck  Skin / nail biopsy Type of biopsy: tangential   Informed consent: discussed and consent obtained   Timeout: patient name, date of birth, surgical site, and procedure verified   Procedure prep:  Patient was prepped and draped in usual sterile fashion (Non sterile) Prep type:  Chlorhexidine Anesthesia: the lesion was anesthetized in a standard fashion   Anesthetic:  1% lidocaine w/ epinephrine 1-100,000 local infiltration Instrument used: flexible razor blade   Outcome: patient tolerated procedure well   Post-procedure details: wound care instructions given    Specimen 1 - Surgical pathology Differential Diagnosis: r/o cn  Check Margins: No    I, Nyilah Kight, PA-C, have reviewed all documentation's for this visit.  The documentation on 04/13/20 for the exam, diagnosis, procedures and orders are all accurate and complete.

## 2020-04-21 ENCOUNTER — Ambulatory Visit: Payer: PPO | Admitting: Physician Assistant

## 2020-06-12 ENCOUNTER — Ambulatory Visit: Payer: Self-pay | Admitting: Nurse Practitioner

## 2020-06-17 ENCOUNTER — Other Ambulatory Visit: Payer: Self-pay

## 2020-06-17 ENCOUNTER — Encounter: Payer: Self-pay | Admitting: Nurse Practitioner

## 2020-06-17 ENCOUNTER — Telehealth: Payer: Self-pay | Admitting: Nurse Practitioner

## 2020-06-17 ENCOUNTER — Ambulatory Visit (INDEPENDENT_AMBULATORY_CARE_PROVIDER_SITE_OTHER): Payer: PPO | Admitting: Nurse Practitioner

## 2020-06-17 VITALS — BP 139/83 | HR 69 | Temp 97.7°F | Resp 20 | Ht 59.0 in | Wt 117.0 lb

## 2020-06-17 DIAGNOSIS — Z1211 Encounter for screening for malignant neoplasm of colon: Secondary | ICD-10-CM | POA: Diagnosis not present

## 2020-06-17 DIAGNOSIS — K219 Gastro-esophageal reflux disease without esophagitis: Secondary | ICD-10-CM | POA: Diagnosis not present

## 2020-06-17 DIAGNOSIS — K581 Irritable bowel syndrome with constipation: Secondary | ICD-10-CM | POA: Diagnosis not present

## 2020-06-17 DIAGNOSIS — E559 Vitamin D deficiency, unspecified: Secondary | ICD-10-CM | POA: Diagnosis not present

## 2020-06-17 DIAGNOSIS — Z1212 Encounter for screening for malignant neoplasm of rectum: Secondary | ICD-10-CM

## 2020-06-17 DIAGNOSIS — I10 Essential (primary) hypertension: Secondary | ICD-10-CM | POA: Diagnosis not present

## 2020-06-17 DIAGNOSIS — M8588 Other specified disorders of bone density and structure, other site: Secondary | ICD-10-CM

## 2020-06-17 DIAGNOSIS — E785 Hyperlipidemia, unspecified: Secondary | ICD-10-CM | POA: Diagnosis not present

## 2020-06-17 DIAGNOSIS — Z23 Encounter for immunization: Secondary | ICD-10-CM

## 2020-06-17 LAB — URINALYSIS
Bilirubin, UA: NEGATIVE
Glucose, UA: NEGATIVE
Ketones, UA: NEGATIVE
Leukocytes,UA: NEGATIVE
Nitrite, UA: NEGATIVE
Specific Gravity, UA: 1.015 (ref 1.005–1.030)
Urobilinogen, Ur: 0.2 mg/dL (ref 0.2–1.0)
pH, UA: 6 (ref 5.0–7.5)

## 2020-06-17 MED ORDER — ATORVASTATIN CALCIUM 20 MG PO TABS: 20.0000 mg | ORAL_TABLET | Freq: Every day | ORAL | 1 refills | Status: DC

## 2020-06-17 MED ORDER — METOPROLOL SUCCINATE ER 50 MG PO TB24: 50.0000 mg | ORAL_TABLET | Freq: Every day | ORAL | 1 refills | Status: DC

## 2020-06-17 MED ORDER — PANTOPRAZOLE SODIUM 40 MG PO TBEC: 40.0000 mg | DELAYED_RELEASE_TABLET | Freq: Every day | ORAL | 1 refills | Status: DC

## 2020-06-17 NOTE — Progress Notes (Signed)
Subjective:    Patient ID: [name], female    DOB: 02/15/1950, 70 y.o.   MRN: [id number]   Chief Complaint: Medical Management of Chronic Issues    HPI:  1. Essential hypertension No c/o chest pain, sob or headache. Does check blood pressure at home. Usually runs 315 systolic. BP Readings from Last 3 Encounters:  06/17/20 139/83  12/12/19 112/72  03/11/19 (!) 149/78     2. Hyperlipidemia with target LDL less than 100 Does watch diet and she has not been doing any exercise as of late. Lab Results  Component Value Date   CHOL 169 12/12/2019   HDL 46 12/12/2019   LDLCALC 96 12/12/2019   TRIG 155 (H) 12/12/2019   CHOLHDL 3.7 12/12/2019     3. Gastroesophageal reflux disease without esophagitis Is on protonix daily. Has no symptoms  4. Irritable bowel syndrome with constipation Is better since she takes a daily probiotic.  5. Osteopenia of lumbar spine Needs dexascan repeated-cannot due today  6. Vitamin D deficiency Is on daily vitamin d supplement    Outpatient Encounter Medications as of 06/17/2020  Medication Sig  . ALPRAZolam (XANAX) 0.25 MG tablet Take 1 tablet (0.25 mg total) by mouth at bedtime as needed.  Marland Kitchen aspirin 81 MG chewable tablet Chew by mouth daily.  Marland Kitchen atorvastatin (LIPITOR) 20 MG tablet Take 1 tablet (20 mg total) by mouth daily at 6 PM.  . BIOTIN PO Take by mouth.  Marland Kitchen CALCIUM-MAGNESIUM PO Take by mouth.  . Cranberry 400 MG CAPS Take by mouth.  . LUMIGAN 0.01 % SOLN   . metoprolol succinate (TOPROL-XL) 50 MG 24 hr tablet Take 1 tablet (50 mg total) by mouth daily. Take with or immediately following a meal.  . pantoprazole (PROTONIX) 40 MG tablet Take 1 tablet (40 mg total) by mouth daily.  Marland Kitchen pyridOXINE (VITAMIN B-6) 100 MG tablet Take 100 mg by mouth daily.  . Cyanocobalamin (VITAMIN B-12 PO) Take 2,500 mcg by mouth daily. (Patient not taking: Reported on 06/17/2020)  . [DISCONTINUED] amoxicillin-clavulanate (AUGMENTIN) 875-125 MG tablet Take  1 tablet by mouth 2 (two) times daily. (Patient not taking: No sig reported)   No facility-administered encounter medications on file as of 06/17/2020.    Past Surgical History:  Procedure Laterality Date  . CARDIAC CATHETERIZATION    . COLPOSCOPY    . excision of mole  2007  . GYNECOLOGIC CRYOSURGERY    . LAPAROSCOPIC CHOLECYSTECTOMY    . TUBAL LIGATION  1991    Family History  Problem Relation Age of Onset  . Hypertension Mother   . Heart disease Mother   . Hyperlipidemia Mother   . Glaucoma Mother   . Hypertension Father   . Cancer Father        LUNG  . Hyperlipidemia Father   . Diabetes Sister   . Hypertension Sister   . Hyperlipidemia Sister   . Fibromyalgia Daughter   . Asthma Daughter     New complaints: None today  Social history: Lives with her husband  Controlled substance contract: 06/17/20     Review of Systems  Constitutional:  Negative for diaphoresis.  Eyes:  Negative for pain.  Respiratory:  Negative for shortness of breath.   Cardiovascular:  Negative for chest pain, palpitations and leg swelling.  Gastrointestinal:  Negative for abdominal pain.  Endocrine: Negative for polydipsia.  Skin:  Negative for rash.  Neurological:  Negative for dizziness, weakness and headaches.  Hematological:  Does not bruise/bleed  easily.  All other systems reviewed and are negative.     Objective:   Physical Exam Vitals and nursing note reviewed.  Constitutional:      General: She is not in acute distress.    Appearance: Normal appearance. She is well-developed.  HENT:     Head: Normocephalic.     Right Ear: Tympanic membrane normal.     Left Ear: Tympanic membrane normal.     Nose: Nose normal.     Mouth/Throat:     Mouth: Mucous membranes are moist.  Eyes:     Pupils: Pupils are equal, round, and reactive to light.  Neck:     Vascular: No carotid bruit or JVD.  Cardiovascular:     Rate and Rhythm: Normal rate and regular rhythm.     Heart sounds:  Normal heart sounds.  Pulmonary:     Effort: Pulmonary effort is normal. No respiratory distress.     Breath sounds: Normal breath sounds. No wheezing or rales.  Chest:     Chest wall: No tenderness.  Abdominal:     General: Bowel sounds are normal. There is no distension or abdominal bruit.     Palpations: Abdomen is soft. There is no hepatomegaly, splenomegaly, mass or pulsatile mass.     Tenderness: There is no abdominal tenderness.  Musculoskeletal:        General: Normal range of motion.     Cervical back: Normal range of motion and neck supple.  Lymphadenopathy:     Cervical: No cervical adenopathy.  Skin:    General: Skin is warm and dry.  Neurological:     Mental Status: She is alert and oriented to person, place, and time.     Deep Tendon Reflexes: Reflexes are normal and symmetric.  Psychiatric:        Behavior: Behavior normal.        Thought Content: Thought content normal.        Judgment: Judgment normal.    BP 139/83   Pulse 69   Temp 97.7 F (36.5 C) (Temporal)   Resp 20   Ht 4' 11"  (1.499 m)   Wt 117 lb (53.1 kg)   HC 2" (5.1 cm)   SpO2 98%   BMI 23.63 kg/m        Assessment & Plan:   AIRICA SCHWARTZKOPF comes in today with chief complaint of Medical Management of Chronic Issues   Diagnosis and orders addressed:  1. Essential hypertension Low sodium diet - CBC with Differential/Platelet - CMP14+EGFR - Urinalysis - metoprolol succinate (TOPROL-XL) 50 MG 24 hr tablet; Take 1 tablet (50 mg total) by mouth daily. Take with or immediately following a meal.  Dispense: 90 tablet; Refill: 1  2. Hyperlipidemia with target LDL less than 100 Low fat diet. - Lipid panel - atorvastatin (LIPITOR) 20 MG tablet; Take 1 tablet (20 mg total) by mouth daily at 6 PM.  Dispense: 90 tablet; Refill: 1  3. Gastroesophageal reflux disease without esophagitis Avoid spicy foods Do not eat 2 hours prior to bedtime - pantoprazole (PROTONIX) 40 MG tablet; Take 1 tablet (40  mg total) by mouth daily.  Dispense: 90 tablet; Refill: 1  4. Irritable bowel syndrome with constipation Continue probiotic  5. Osteopenia of lumbar spine Weight bearing exercise  6. Vitamin D deficiency Cntinue daily vitamin d supplement   Labs pending Health Maintenance reviewed- colorguard ordered Diet and exercise encouraged  Follow up plan: 6 months   Mary-Margaret Hassell Done, FNP

## 2020-06-17 NOTE — Telephone Encounter (Signed)
Per chart due 08/12/2020 - advised patient and made appt

## 2020-06-17 NOTE — Addendum Note (Signed)
Addended by: Rolena Infante on: 06/17/2020 02:09 PM   Modules accepted: Orders

## 2020-06-18 LAB — CBC WITH DIFFERENTIAL/PLATELET
Basophils Absolute: 0 10*3/uL (ref 0.0–0.2)
Basos: 0 %
EOS (ABSOLUTE): 0.3 10*3/uL (ref 0.0–0.4)
Eos: 4 %
Hematocrit: 43.6 % (ref 34.0–46.6)
Hemoglobin: 14.2 g/dL (ref 11.1–15.9)
Immature Grans (Abs): 0 10*3/uL (ref 0.0–0.1)
Immature Granulocytes: 0 %
Lymphocytes Absolute: 2.7 10*3/uL (ref 0.7–3.1)
Lymphs: 39 %
MCH: 30 pg (ref 26.6–33.0)
MCHC: 32.6 g/dL (ref 31.5–35.7)
MCV: 92 fL (ref 79–97)
Monocytes Absolute: 0.6 10*3/uL (ref 0.1–0.9)
Monocytes: 9 %
Neutrophils Absolute: 3.4 10*3/uL (ref 1.4–7.0)
Neutrophils: 48 %
Platelets: 228 10*3/uL (ref 150–450)
RBC: 4.74 x10E6/uL (ref 3.77–5.28)
RDW: 13.3 % (ref 11.7–15.4)
WBC: 7 10*3/uL (ref 3.4–10.8)

## 2020-06-18 LAB — CMP14+EGFR
ALT: 26 IU/L (ref 0–32)
AST: 23 IU/L (ref 0–40)
Albumin/Globulin Ratio: 1.8 (ref 1.2–2.2)
Albumin: 4.7 g/dL (ref 3.7–4.7)
Alkaline Phosphatase: 100 IU/L (ref 44–121)
BUN/Creatinine Ratio: 17 (ref 12–28)
BUN: 12 mg/dL (ref 8–27)
Bilirubin Total: 0.4 mg/dL (ref 0.0–1.2)
CO2: 25 mmol/L (ref 20–29)
Calcium: 9.7 mg/dL (ref 8.7–10.3)
Chloride: 103 mmol/L (ref 96–106)
Creatinine, Ser: 0.72 mg/dL (ref 0.57–1.00)
Globulin, Total: 2.6 g/dL (ref 1.5–4.5)
Glucose: 86 mg/dL (ref 65–99)
Potassium: 4.4 mmol/L (ref 3.5–5.2)
Sodium: 144 mmol/L (ref 134–144)
Total Protein: 7.3 g/dL (ref 6.0–8.5)
eGFR: 89 mL/min/{1.73_m2} (ref >59)

## 2020-06-18 LAB — LIPID PANEL
Chol/HDL Ratio: 3.7 ratio (ref 0.0–4.4)
Cholesterol, Total: 176 mg/dL (ref 100–199)
HDL: 47 mg/dL (ref >39)
LDL Chol Calc (NIH): 100 mg/dL — ABNORMAL HIGH (ref 0–99)
Triglycerides: 166 mg/dL — ABNORMAL HIGH (ref 0–149)
VLDL Cholesterol Cal: 29 mg/dL (ref 5–40)

## 2020-06-30 DIAGNOSIS — H2513 Age-related nuclear cataract, bilateral: Secondary | ICD-10-CM | POA: Diagnosis not present

## 2020-06-30 DIAGNOSIS — H40023 Open angle with borderline findings, high risk, bilateral: Secondary | ICD-10-CM | POA: Diagnosis not present

## 2020-07-16 DIAGNOSIS — Z1212 Encounter for screening for malignant neoplasm of rectum: Secondary | ICD-10-CM | POA: Diagnosis not present

## 2020-07-25 LAB — COLOGUARD: COLOGUARD: NEGATIVE

## 2020-07-27 ENCOUNTER — Other Ambulatory Visit: Payer: Self-pay

## 2020-07-27 ENCOUNTER — Ambulatory Visit: Payer: PPO | Admitting: Nurse Practitioner

## 2020-07-27 ENCOUNTER — Encounter: Payer: Self-pay | Admitting: Nurse Practitioner

## 2020-07-27 VITALS — BP 118/76

## 2020-07-27 DIAGNOSIS — R350 Frequency of micturition: Secondary | ICD-10-CM | POA: Diagnosis not present

## 2020-07-27 DIAGNOSIS — N952 Postmenopausal atrophic vaginitis: Secondary | ICD-10-CM | POA: Diagnosis not present

## 2020-07-27 DIAGNOSIS — N898 Other specified noninflammatory disorders of vagina: Secondary | ICD-10-CM | POA: Diagnosis not present

## 2020-07-27 LAB — URINALYSIS W MICROSCOPIC + REFLEX CULTURE
Bilirubin Urine: NEGATIVE
Glucose, UA: NEGATIVE
Hyaline Cast: NONE SEEN /LPF
Ketones, ur: NEGATIVE
Leukocyte Esterase: NEGATIVE
Nitrites, Initial: NEGATIVE
Specific Gravity, Urine: 1.025 (ref 1.001–1.035)
pH: 7 (ref 5.0–8.0)

## 2020-07-27 LAB — NO CULTURE INDICATED

## 2020-07-27 NOTE — Progress Notes (Signed)
   Acute Office Visit  Subjective:    Patient ID: [name], female    DOB: 1950-12-23, 70 y.o.   MRN: [id number]   HPI 70 y.o. presents today for vaginal itching and dryness. Symptoms have been off and on for months but has progressively worsened recently to where it is painful. Denies discharge or odor. She does have mild urinary frequency and urgency.    Review of Systems  Constitutional: Negative.   Genitourinary:  Positive for frequency, urgency and vaginal pain. Negative for dysuria, flank pain, hematuria and vaginal discharge.      Objective:    Physical Exam Constitutional:      Appearance: Normal appearance.  Genitourinary:    Comments: Atrophic changes   BP 118/76  Wt Readings from Last 3 Encounters:  06/17/20 117 lb (53.1 kg)  12/12/19 120 lb (54.4 kg)  03/11/19 120 lb (54.4 kg)   UA: negative leukocytes, negative nitrates, 1+ blood, yellow/cloudy, SG 1.025. Microscopic: wbc 0-5, rbc 0-2, few bacteria, many amorphous sediment     Assessment & Plan:   Problem List Items Addressed This Visit   None Visit Diagnoses     Postmenopausal atrophic vaginitis    -  Primary   Vagina itching       Urinary frequency       Relevant Orders   Urinalysis w microscopic + reflex cultur (Completed)       Plan: Symptoms likely from atrophic vaginitis. We discussed these changes and management options of oil-based lubricant versus vaginal estrogen. She would like to try lubrication and it is recommended to use oil-based product such as coconut oil. If symptoms persist she will return to office. UA unremarkable. She is agreeable to plan.     Tamela Gammon DNP, 3:47 PM 07/27/2020

## 2020-07-28 LAB — COLOGUARD: Cologuard: NEGATIVE

## 2020-08-12 ENCOUNTER — Ambulatory Visit: Payer: PPO

## 2020-08-13 ENCOUNTER — Ambulatory Visit (INDEPENDENT_AMBULATORY_CARE_PROVIDER_SITE_OTHER): Payer: PPO

## 2020-08-13 ENCOUNTER — Other Ambulatory Visit: Payer: Self-pay

## 2020-08-13 DIAGNOSIS — Z23 Encounter for immunization: Secondary | ICD-10-CM

## 2020-08-13 NOTE — Progress Notes (Signed)
Shingrix vaccine given to right deltoid.  Patient tolerated well.

## 2020-10-01 ENCOUNTER — Ambulatory Visit (INDEPENDENT_AMBULATORY_CARE_PROVIDER_SITE_OTHER): Payer: PPO | Admitting: Nurse Practitioner

## 2020-10-01 ENCOUNTER — Encounter: Payer: Self-pay | Admitting: Nurse Practitioner

## 2020-10-01 DIAGNOSIS — B373 Candidiasis of vulva and vagina: Secondary | ICD-10-CM | POA: Diagnosis not present

## 2020-10-01 DIAGNOSIS — B3731 Acute candidiasis of vulva and vagina: Secondary | ICD-10-CM

## 2020-10-01 MED ORDER — FLUCONAZOLE 150 MG PO TABS: 150.0000 mg | ORAL_TABLET | Freq: Once | ORAL | 0 refills | Status: AC

## 2020-10-01 NOTE — Progress Notes (Signed)
   Virtual Visit  Note Due to COVID-19 pandemic this visit was conducted virtually. This visit type was conducted due to national recommendations for restrictions regarding the COVID-19 Pandemic (e.g. social distancing, sheltering in place) in an effort to limit this patient's exposure and mitigate transmission in our community. All issues noted in this document were discussed and addressed.  A physical exam was not performed with this format.  I connected with [name] on 10/01/20 at 12:59 by telephone and verified that I am speaking with the correct person using two identifiers. [name] is currently located at home and no one is currently with her during visit. The provider, Mary-Margaret Hassell Done, FNP is located in their office at time of visit.  I discussed the limitations, risks, security and privacy concerns of performing an evaluation and management service by telephone and the availability of in person appointments. I also discussed with the patient that there may be a patient responsible charge related to this service. The patient expressed understanding and agreed to proceed.   History and Present Illness:   Chief Complaint: yeast infection  HPI Patient is c/o vaginal burning and itching. She has been on antibiotic earlier this month. Thinks she has a yeast infection.    Review of Systems  Constitutional:  Negative for diaphoresis and weight loss.  Eyes:  Negative for blurred vision, double vision and pain.  Respiratory:  Negative for shortness of breath.   Cardiovascular:  Negative for chest pain, palpitations, orthopnea and leg swelling.  Gastrointestinal:  Negative for abdominal pain.  Skin:  Negative for rash.  Neurological:  Negative for dizziness, sensory change, loss of consciousness, weakness and headaches.  Endo/Heme/Allergies:  Negative for polydipsia. Does not bruise/bleed easily.  Psychiatric/Behavioral:  Negative for memory loss. The patient does not have  insomnia.   All other systems reviewed and are negative.   Observations/Objective: Alert and oriented- answers all questions appropriately No distress   Assessment and Plan: [name] in today with chief complaint of No chief complaint on file.   1. Vaginal candidiasis No bubble baths Void after intercourse - fluconazole (DIFLUCAN) 150 MG tablet; Take 1 tablet (150 mg total) by mouth once for 1 dose.  Dispense: 1 tablet; Refill: 0      Follow Up Instructions: prn    I discussed the assessment and treatment plan with the patient. The patient was provided an opportunity to ask questions and all were answered. The patient agreed with the plan and demonstrated an understanding of the instructions.   The patient was advised to call back or seek an in-person evaluation if the symptoms worsen or if the condition fails to improve as anticipated.  The above assessment and management plan was discussed with the patient. The patient verbalized understanding of and has agreed to the management plan. Patient is aware to call the clinic if symptoms persist or worsen. Patient is aware when to return to the clinic for a follow-up visit. Patient educated on when it is appropriate to go to the emergency department.   Time call ended:  1:10  I provided 11 minutes of  non face-to-face time during this encounter.    Mary-Margaret Hassell Done, FNP

## 2020-11-02 ENCOUNTER — Other Ambulatory Visit: Payer: Self-pay | Admitting: Nurse Practitioner

## 2020-11-02 DIAGNOSIS — Z1231 Encounter for screening mammogram for malignant neoplasm of breast: Secondary | ICD-10-CM

## 2020-11-17 ENCOUNTER — Ambulatory Visit (INDEPENDENT_AMBULATORY_CARE_PROVIDER_SITE_OTHER): Payer: PPO

## 2020-11-17 ENCOUNTER — Other Ambulatory Visit: Payer: Self-pay

## 2020-11-17 DIAGNOSIS — Z23 Encounter for immunization: Secondary | ICD-10-CM

## 2020-11-23 ENCOUNTER — Ambulatory Visit (INDEPENDENT_AMBULATORY_CARE_PROVIDER_SITE_OTHER): Payer: PPO

## 2020-11-23 VITALS — Ht 59.0 in | Wt 120.0 lb

## 2020-11-23 DIAGNOSIS — Z0000 Encounter for general adult medical examination without abnormal findings: Secondary | ICD-10-CM | POA: Diagnosis not present

## 2020-11-23 NOTE — Patient Instructions (Signed)
[name] , Thank you for taking time to come for your Medicare Wellness Visit. I appreciate your ongoing commitment to your health goals. Please review the following plan we discussed and let me know if I can assist you in the future.   Screening recommendations/referrals: Colonoscopy: Cologuard done 07/16/2020 - repeat every 3 years Mammogram: Done 12/11/2019 - Repeat annually *has appointment 12/14/20 Bone Density: Done 03/14/2018 - Repeat every 2 years *has this done with [facility] Recommended yearly ophthalmology/optometry visit for glaucoma screening and checkup Recommended yearly dental visit for hygiene and checkup  Vaccinations: Influenza vaccine: Done 11/17/2020 - Repeat annually  Pneumococcal vaccine: Done 07/11/2014 & 07/21/2015 Tdap vaccine: Done 05/14/2011 - Repeat in 10 years  Shingles vaccine: Done 06/17/2020 & 08/13/2020   Covid-19: Done 02/13/2019, 03/12/2019, & 10/16/2019  Advanced directives: Advance directive discussed with you today. Even though you declined this today, please call our office should you change your mind, and we can give you the proper paperwork for you to fill out.   Conditions/risks identified: Aim for 30 minutes of exercise or brisk walking each day, drink 6-8 glasses of water and eat lots of fruits and vegetables.   Next appointment: Follow up in one year for your annual wellness visit    Preventive Care 65 Years and Older, Female Preventive care refers to lifestyle choices and visits with your health care provider that can promote health and wellness. What does preventive care include? A yearly physical exam. This is also called an annual well check. Dental exams once or twice a year. Routine eye exams. Ask your health care provider how often you should have your eyes checked. Personal lifestyle choices, including: Daily care of your teeth and gums. Regular physical activity. Eating a healthy diet. Avoiding tobacco and drug use. Limiting  alcohol use. Practicing safe sex. Taking low-dose aspirin every day. Taking vitamin and mineral supplements as recommended by your health care provider. What happens during an annual well check? The services and screenings done by your health care provider during your annual well check will depend on your age, overall health, lifestyle risk factors, and family history of disease. Counseling  Your health care provider may ask you questions about your: Alcohol use. Tobacco use. Drug use. Emotional well-being. Home and relationship well-being. Sexual activity. Eating habits. History of falls. Memory and ability to understand (cognition). Work and work Statistician. Reproductive health. Screening  You may have the following tests or measurements: Height, weight, and BMI. Blood pressure. Lipid and cholesterol levels. These may be checked every 5 years, or more frequently if you are over 35 years old. Skin check. Lung cancer screening. You may have this screening every year starting at age 15 if you have a 30-pack-year history of smoking and currently smoke or have quit within the past 15 years. Fecal occult blood test (FOBT) of the stool. You may have this test every year starting at age 71. Flexible sigmoidoscopy or colonoscopy. You may have a sigmoidoscopy every 5 years or a colonoscopy every 10 years starting at age 16. Hepatitis C blood test. Hepatitis B blood test. Sexually transmitted disease (STD) testing. Diabetes screening. This is done by checking your blood sugar (glucose) after you have not eaten for a while (fasting). You may have this done every 1-3 years. Bone density scan. This is done to screen for osteoporosis. You may have this done starting at age 76. Mammogram. This may be done every 1-2 years. Talk to your health care provider about how  often you should have regular mammograms. Talk with your health care provider about your test results, treatment options, and if  necessary, the need for more tests. Vaccines  Your health care provider may recommend certain vaccines, such as: Influenza vaccine. This is recommended every year. Tetanus, diphtheria, and acellular pertussis (Tdap, Td) vaccine. You may need a Td booster every 10 years. Zoster vaccine. You may need this after age 58. Pneumococcal 13-valent conjugate (PCV13) vaccine. One dose is recommended after age 16. Pneumococcal polysaccharide (PPSV23) vaccine. One dose is recommended after age 21. Talk to your health care provider about which screenings and vaccines you need and how often you need them. This information is not intended to replace advice given to you by your health care provider. Make sure you discuss any questions you have with your health care provider. Document Released: 01/16/2015 Document Revised: 09/09/2015 Document Reviewed: 10/21/2014 Elsevier Interactive Patient Education  2017 [place] Prevention in the Home Falls can cause injuries. They can happen to people of all ages. There are many things you can do to make your home safe and to help prevent falls. What can I do on the outside of my home? Regularly fix the edges of walkways and driveways and fix any cracks. Remove anything that might make you trip as you walk through a door, such as a raised step or threshold. Trim any bushes or trees on the path to your home. Use bright outdoor lighting. Clear any walking paths of anything that might make someone trip, such as rocks or tools. Regularly check to see if handrails are loose or broken. Make sure that both sides of any steps have handrails. Any raised decks and porches should have guardrails on the edges. Have any leaves, snow, or ice cleared regularly. Use sand or salt on walking paths during winter. Clean up any spills in your garage right away. This includes oil or grease spills. What can I do in the bathroom? Use night lights. Install grab bars by the toilet  and in the tub and shower. Do not use towel bars as grab bars. Use non-skid mats or decals in the tub or shower. If you need to sit down in the shower, use a plastic, non-slip stool. Keep the floor dry. Clean up any water that spills on the floor as soon as it happens. Remove soap buildup in the tub or shower regularly. Attach bath mats securely with double-sided non-slip rug tape. Do not have throw rugs and other things on the floor that can make you trip. What can I do in the bedroom? Use night lights. Make sure that you have a light by your bed that is easy to reach. Do not use any sheets or blankets that are too big for your bed. They should not hang down onto the floor. Have a firm chair that has side arms. You can use this for support while you get dressed. Do not have throw rugs and other things on the floor that can make you trip. What can I do in the kitchen? Clean up any spills right away. Avoid walking on wet floors. Keep items that you use a lot in easy-to-reach places. If you need to reach something above you, use a strong step stool that has a grab bar. Keep electrical cords out of the way. Do not use floor polish or wax that makes floors slippery. If you must use wax, use non-skid floor wax. Do not have throw rugs and other things  on the floor that can make you trip. What can I do with my stairs? Do not leave any items on the stairs. Make sure that there are handrails on both sides of the stairs and use them. Fix handrails that are broken or loose. Make sure that handrails are as long as the stairways. Check any carpeting to make sure that it is firmly attached to the stairs. Fix any carpet that is loose or worn. Avoid having throw rugs at the top or bottom of the stairs. If you do have throw rugs, attach them to the floor with carpet tape. Make sure that you have a light switch at the top of the stairs and the bottom of the stairs. If you do not have them, ask someone to add  them for you. What else can I do to help prevent falls? Wear shoes that: Do not have high heels. Have rubber bottoms. Are comfortable and fit you well. Are closed at the toe. Do not wear sandals. If you use a stepladder: Make sure that it is fully opened. Do not climb a closed stepladder. Make sure that both sides of the stepladder are locked into place. Ask someone to hold it for you, if possible. Clearly mark and make sure that you can see: Any grab bars or handrails. First and last steps. Where the edge of each step is. Use tools that help you move around (mobility aids) if they are needed. These include: Canes. Walkers. Scooters. Crutches. Turn on the lights when you go into a dark area. Replace any light bulbs as soon as they burn out. Set up your furniture so you have a clear path. Avoid moving your furniture around. If any of your floors are uneven, fix them. If there are any pets around you, be aware of where they are. Review your medicines with your doctor. Some medicines can make you feel dizzy. This can increase your chance of falling. Ask your doctor what other things that you can do to help prevent falls. This information is not intended to replace advice given to you by your health care provider. Make sure you discuss any questions you have with your health care provider. Document Released: 10/16/2008 Document Revised: 05/28/2015 Document Reviewed: 01/24/2014 Elsevier Interactive Patient Education  2017 [employer].

## 2020-11-23 NOTE — Progress Notes (Signed)
Subjective:   FLANNERY CAVALLERO is a 70 y.o. female who presents for Medicare Annual (Subsequent) preventive examination.  Virtual Visit via Telephone Note  I connected with  CLAUDEAN LEAVELLE on 11/23/20 at 10:30 AM EST by telephone and verified that I am speaking with the correct person using two identifiers.  Location: Patient: Home Provider: [facility] Persons participating in the virtual visit: patient/Nurse Health Advisor   I discussed the limitations, risks, security and privacy concerns of performing an evaluation and management service by telephone and the availability of in person appointments. The patient expressed understanding and agreed to proceed.  Interactive audio and video telecommunications were attempted between this nurse and patient, however failed, due to patient having technical difficulties OR patient did not have access to video capability.  We continued and completed visit with audio only.  Some vital signs may be absent or patient reported.   Lenzy Kerschner E Eann Cleland, LPN   Review of Systems     Cardiac Risk Factors include: advanced age (>17men, >41 women);sedentary lifestyle;dyslipidemia;hypertension     Objective:    Today's Vitals   11/23/20 1031  Weight: 120 lb (54.4 kg)  Height: 4\' 11"  (1.499 m)   Body mass index is 24.24 kg/m.  Advanced Directives 11/23/2020 11/21/2019 11/20/2018 11/15/2017 10/19/2016  Does Patient Have a Medical Advance Directive? No No No No No  Would patient like information on creating a medical advance directive? No - Patient declined No - Patient declined No - Patient declined No - Patient declined Yes (MAU/Ambulatory/Procedural Areas - Information given)    Current Medications (verified) Outpatient Encounter Medications as of 11/23/2020  Medication Sig   ALPRAZolam (XANAX) 0.25 MG tablet Take 1 tablet (0.25 mg total) by mouth at bedtime as needed.   aspirin 81 MG chewable tablet Chew by mouth daily.   atorvastatin (LIPITOR) 20 MG tablet  Take 1 tablet (20 mg total) by mouth daily at 6 PM.   BIOTIN PO Take by mouth.   CALCIUM-MAGNESIUM PO Take by mouth.   Cranberry 400 MG CAPS Take by mouth.   LUMIGAN 0.01 % SOLN    metoprolol succinate (TOPROL-XL) 50 MG 24 hr tablet Take 1 tablet (50 mg total) by mouth daily. Take with or immediately following a meal.   pantoprazole (PROTONIX) 40 MG tablet Take 1 tablet (40 mg total) by mouth daily.   pyridOXINE (VITAMIN B-6) 100 MG tablet Take 100 mg by mouth daily.   No facility-administered encounter medications on file as of 11/23/2020.    Allergies (verified) Codeine and Sulfonamide derivatives   History: Past Medical History:  Diagnosis Date   Allergic rhinitis    Atypical mole    Cerumen impaction    right    Chest discomfort    Chronic constipation    Diverticulosis    GERD (gastroesophageal reflux disease)    Glaucoma    Hyperlipidemia    Kidney stones    Nasal congestion    Osteopenia 03/2018   T score -2.0 FRAX 12% / 2.2% stable from prior DEXA   Palpitation    PUD (peptic ulcer disease)    Scoliosis    Sinusitis    Vitamin D deficiency    Past Surgical History:  Procedure Laterality Date   CARDIAC CATHETERIZATION     COLPOSCOPY     excision of mole  2007   GYNECOLOGIC CRYOSURGERY     LAPAROSCOPIC CHOLECYSTECTOMY     TUBAL LIGATION  1991   Family History  Problem Relation Age  of Onset   Hypertension Mother    Heart disease Mother    Hyperlipidemia Mother    Glaucoma Mother    Hypertension Father    Cancer Father        LUNG   Hyperlipidemia Father    Diabetes Sister    Hypertension Sister    Hyperlipidemia Sister    Fibromyalgia Daughter    Asthma Daughter    Social History   Socioeconomic History   Marital status: Married    Spouse name: Eddie   Number of children: 1   Years of education: 14   Highest education level: Associate degree: occupational, Hotel manager, or vocational program  Occupational History   Occupation: Retired     Fish farm manager: [employer]  Tobacco Use   Smoking status: Never   Smokeless tobacco: Never  Vaping Use   Vaping Use: Never used  Substance and Sexual Activity   Alcohol use: Yes    Alcohol/week: 0.0 standard drinks    Comment: Rare   Drug use: No   Sexual activity: Yes    Birth control/protection: Post-menopausal, Surgical    Comment: Tubal lig-1st intercourse 70 yo-Fewer than 5 partners  Other Topics Concern   Not on file  Social History Narrative   Not on file   Social Determinants of Health   Financial Resource Strain: Low Risk    Difficulty of Paying Living Expenses: Not hard at all  Food Insecurity: No Food Insecurity   Worried About Charity fundraiser in the Last Year: Never true   [place] in the Last Year: Never true  Transportation Needs: No Transportation Needs   Lack of Transportation (Medical): No   Lack of Transportation (Non-Medical): No  Physical Activity: Insufficiently Active   Days of Exercise per Week: 7 days   Minutes of Exercise per Session: 10 min  Stress: No Stress Concern Present   Feeling of Stress : Not at all  Social Connections: Socially Integrated   Frequency of Communication with Friends and Family: More than three times a week   Frequency of Social Gatherings with Friends and Family: More than three times a week   Attends Religious Services: 1 to 4 times per year   Active Member of [employer] or Organizations: Yes   Attends Archivist Meetings: 1 to 4 times per year   Marital Status: Married    Tobacco Counseling Counseling given: Not Answered   Clinical Intake:  Pre-visit preparation completed: Yes  Pain : No/denies pain     BMI - recorded: 24.24 Nutritional Status: BMI of 19-24  Normal Nutritional Risks: None Diabetes: No  How often do you need to have someone help you when you read instructions, pamphlets, or other written materials from your doctor or pharmacy?: 1 - Never  Diabetic? no  Interpreter Needed?:  No  Information entered by :: Syna Gad, LPN   Activities of Daily Living In your present state of health, do you have any difficulty performing the following activities: 11/23/2020  Hearing? N  Vision? N  Difficulty concentrating or making decisions? N  Walking or climbing stairs? N  Dressing or bathing? N  Doing errands, shopping? N  Preparing Food and eating ? N  Using the Toilet? N  In the past six months, have you accidently leaked urine? N  Do you have problems with loss of bowel control? N  Managing your Medications? N  Managing your Finances? N  Housekeeping or managing your Housekeeping? N  Some recent  data might be hidden    Patient Care Team: Chevis Pretty, FNP as PCP - General (Family Medicine) [facility] (Dermatology) Phineas Real, Belinda Block, MD (Inactive) as Consulting Physician (Gynecology) [facility] (Diagnostic Radiology) Jola Schmidt, MD as Consulting Physician (Ophthalmology)  Indicate any recent Medical Services you may have received from other than [facility] providers in the past year (date may be approximate).     Assessment:   This is a routine wellness examination for Zakyah.  Hearing/Vision screen Hearing Screening - Comments:: Denies hearing difficulties  Vision Screening - Comments:: Wears rx glasses or contact lenses - up to date with annual eye exams with Dr Valetta Close (has glaucoma)  Dietary issues and exercise activities discussed: Current Exercise Habits: Home exercise routine, Type of exercise: walking, Time (Minutes): 10, Frequency (Times/Week): 7, Weekly Exercise (Minutes/Week): 70, Intensity: Mild, Exercise limited by: None identified   Goals Addressed             This Visit's Progress    DIET - EAT MORE FRUITS AND VEGETABLES   On track    DIET - [place]   Not on track    Try to drink 6-8 glasses of water daily     Increase physical activity   Not on track    Pt was  walking, plans ro start back       Depression Screen PHQ 2/9 Scores 11/23/2020 06/17/2020 12/12/2019 11/21/2019 11/21/2019 03/11/2019 11/20/2018  PHQ - 2 Score 0 0 0 0 0 0 0  PHQ- 9 Score - 2 - - - - -    Fall Risk Fall Risk  11/23/2020 06/17/2020 12/12/2019 11/21/2019 03/11/2019  Falls in the past year? 0 0 0 0 0  Number falls in past yr: 0 - - - -  Injury with Fall? 0 - - - -  Risk for fall due to : Medication side effect;Other (Comment) - - - -  Risk for fall due to: Comment intermittent vertigo - - - -  Follow up Falls prevention discussed - - - -    FALL RISK PREVENTION PERTAINING TO THE HOME:  Any stairs in or around the home? No  If so, are there any without handrails? No  Home free of loose throw rugs in walkways, pet beds, electrical cords, etc? Yes  Adequate lighting in your home to reduce risk of falls? Yes   ASSISTIVE DEVICES UTILIZED TO PREVENT FALLS:  Life alert? No  Use of a cane, walker or w/c? No  Grab bars in the bathroom? Yes  Shower chair or bench in shower? Yes  Elevated toilet seat or a handicapped toilet? No   TIMED UP AND GO:  Was the test performed? No . Telephonic visit  Cognitive Function: Normal cognitive status assessed by direct observation by this Nurse Health Advisor. No abnormalities found.    MMSE - Mini Mental State Exam 11/15/2017 10/19/2016  Orientation to time 5 5  Orientation to Place 5 5  Registration 3 3  Attention/ Calculation 5 5  Recall 3 3  Language- name 2 objects 2 2  Language- repeat 1 1  Language- follow 3 step command 3 3  Language- read & follow direction 1 1  Write a sentence 1 1  Copy design 1 1  Total score 30 30     6CIT Screen 11/21/2019 11/20/2018  What Year? 0 points 0 points  What month? 0 points 0 points  What time? 0 points 0 points  Count  back from 20 0 points 0 points  Months in reverse 0 points 0 points  Repeat phrase 0 points 0 points  Total Score 0 0    Immunizations Immunization History   Administered Date(s) Administered   Fluad Quad(high Dose 65+) 09/11/2018, 11/21/2019, 11/17/2020   Influenza, High Dose Seasonal PF 10/19/2016, 10/09/2017   Influenza,inj,Quad PF,6+ Mos 10/14/2015   Influenza-Unspecified 11/18/2014   Moderna Sars-Covid-2 Vaccination 02/13/2019, 03/12/2019, 10/16/2019   Pneumococcal Conjugate-13 07/11/2014   Pneumococcal Polysaccharide-23 07/21/2015   Tdap 05/14/2011   Zoster Recombinat (Shingrix) 06/17/2020, 08/13/2020   Zoster, Live 04/04/2011    TDAP status: Up to date  Flu Vaccine status: Up to date  Pneumococcal vaccine status: Up to date  Covid-19 vaccine status: Completed vaccines  Qualifies for Shingles Vaccine? Yes   Zostavax completed Yes   Shingrix Completed?: Yes  Screening Tests Health Maintenance  Topic Date Due   COVID-19 Vaccine (4 - Booster for Moderna series) 12/11/2019   DEXA SCAN  06/17/2021 (Originally 03/13/2020)   MAMMOGRAM  12/10/2020   TETANUS/TDAP  05/13/2021   Fecal DNA (Cologuard)  07/17/2023   Pneumonia Vaccine 51+ Years old  Completed   INFLUENZA VACCINE  Completed   Hepatitis C Screening  Completed   Zoster Vaccines- Shingrix  Completed   HPV VACCINES  Aged Out   COLONOSCOPY (Pts 45-48yrs Insurance coverage will need to be confirmed)  Discontinued    Health Maintenance  Health Maintenance Due  Topic Date Due   COVID-19 Vaccine (4 - Booster for Moderna series) 12/11/2019    Colorectal cancer screening: Type of screening: Cologuard. Completed 07/16/2020. Repeat every 3 years  Mammogram status: Completed 12/11/2019. Repeat every year  Bone Density status: Completed 03/14/2018. Results reflect: Bone density results: OSTEOPENIA. Repeat every 2 years.  Lung Cancer Screening: (Low Dose CT Chest recommended if Age 3-80 years, 30 pack-year currently smoking OR have quit w/in 15years.) does not qualify.   Additional Screening:  Hepatitis C Screening: does qualify; Completed 01/20/2015  Vision Screening:  Recommended annual ophthalmology exams for early detection of glaucoma and other disorders of the eye. Is the patient up to date with their annual eye exam?  Yes  Who is the provider or what is the name of the office in which the patient attends annual eye exams? [facility] If pt is not established with a provider, would they like to be referred to a provider to establish care? No .   Dental Screening: Recommended annual dental exams for proper oral hygiene  Community Resource Referral / Chronic Care Management: CRR required this visit?  No   CCM required this visit?  No      Plan:     I have personally reviewed and noted the following in the patient's chart:   Medical and social history Use of alcohol, tobacco or illicit drugs  Current medications and supplements including opioid prescriptions.  Functional ability and status Nutritional status Physical activity Advanced directives List of other physicians Hospitalizations, surgeries, and ER visits in previous 12 months Vitals Screenings to include cognitive, depression, and falls Referrals and appointments  In addition, I have reviewed and discussed with patient certain preventive protocols, quality metrics, and best practice recommendations. A written personalized care plan for preventive services as well as general preventive health recommendations were provided to patient.     Sandrea Hammond, LPN   30/07/6224   Nurse Notes: none

## 2020-12-01 ENCOUNTER — Other Ambulatory Visit: Payer: Self-pay

## 2020-12-01 ENCOUNTER — Encounter: Payer: Self-pay | Admitting: Nurse Practitioner

## 2020-12-01 ENCOUNTER — Ambulatory Visit (INDEPENDENT_AMBULATORY_CARE_PROVIDER_SITE_OTHER): Payer: PPO | Admitting: Nurse Practitioner

## 2020-12-01 VITALS — BP 130/80 | Ht <= 58 in | Wt 119.0 lb

## 2020-12-01 DIAGNOSIS — M8589 Other specified disorders of bone density and structure, multiple sites: Secondary | ICD-10-CM | POA: Diagnosis not present

## 2020-12-01 DIAGNOSIS — Z780 Asymptomatic menopausal state: Secondary | ICD-10-CM

## 2020-12-01 DIAGNOSIS — Z01419 Encounter for gynecological examination (general) (routine) without abnormal findings: Secondary | ICD-10-CM | POA: Diagnosis not present

## 2020-12-01 DIAGNOSIS — N952 Postmenopausal atrophic vaginitis: Secondary | ICD-10-CM

## 2020-12-01 MED ORDER — ESTRADIOL 0.1 MG/GM VA CREA: 1.0000 | TOPICAL_CREAM | VAGINAL | 12 refills | Status: DC

## 2020-12-01 NOTE — Progress Notes (Signed)
[name] Jun 09, 1950 [id number]   History:  70 y.o. G1P1001 presents for breast and pelvic exam. She complains of intermittent vulvar itching and burning. Was seen for this previously with recommendations to try OTC lubricants. She felt some relief with coconut oil but symptoms still persist. Denies discharge or odor. Postmenopausal - no HRT, no bleeding. Cryosurgery many years ago, subsequent paps normal. GERD, HLD managed by PCP.   Gynecologic History No LMP recorded. Patient is postmenopausal.   Contraception: post menopausal status Sexually active: Yes  Health Maintenance Last Pap: 11/16/2017. Results were: Normal Last mammogram: 12/11/2019. Results were: Normal Last colonoscopy: 2011. Results were: Normal. Negative Cologuard 2022 Last Dexa: 03/14/2018. Results were: T-score -2.0, FRAX 12% / 2.2%  Past medical history, past surgical history, family history and social history were all reviewed and documented in the EPIC chart. Married. Retired. 1 daughter who lives close, granddaughter and grandson.   ROS:  A ROS was performed and pertinent positives and negatives are included.  Exam:  Vitals:   12/01/20 1006  BP: 130/80  Weight: 119 lb (54 kg)  Height: 4\' 10"  (1.473 m)   Body mass index is 24.87 kg/m.  General appearance:  Normal Thyroid:  Symmetrical, normal in size, without palpable masses or nodularity. Respiratory  Auscultation:  Clear without wheezing or rhonchi Cardiovascular  Auscultation:  Regular rate, without rubs, murmurs or gallops  Edema/varicosities:  Not grossly evident Abdominal  Soft,nontender, without masses, guarding or rebound.  Liver/spleen:  No organomegaly noted  Hernia:  None appreciated  Skin  Inspection:  Grossly normal Breasts: Examined lying and sitting.   Right: Without masses, retractions, nipple discharge or axillary adenopathy.   Left: Without masses, retractions, nipple discharge or axillary adenopathy. Genitourinary    Inguinal/mons:  Normal without inguinal adenopathy  External genitalia:  Normal appearing vulva with no masses, tenderness, or lesions. Atrophic changes  BUS/Urethra/Skene's glands:  Normal  Vagina:  Normal appearing with normal color and discharge, no lesions. Atrophic changes. Atrophic changes  Cervix:  Normal appearing without discharge or lesions  Uterus:  Normal in size, shape and contour.  Midline and mobile, nontender  Adnexa/parametria:     Rt: Normal in size, without masses or tenderness.   Lt: Normal in size, without masses or tenderness.  Anus and perineum: Normal  Digital rectal exam: Normal sphincter tone without palpated masses or tenderness  Patient informed chaperone available to be present for breast and pelvic exam. Patient has requested no chaperone to be present. Patient has been advised what will be completed during breast and pelvic exam.   Assessment/Plan:  70 y.o. G1P1001 for breast and pelvic exam.   Well female exam with routine gynecological exam - Education provided on SBEs, importance of preventative screenings, current guidelines, high calcium diet, regular exercise, and multivitamin daily. Labs with PCP.   Postmenopausal - Plan: DG Bone Density. No HRT, no bleeding.   Osteopenia of multiple sites - Plan: DG Bone Density. T-score -2.0 without elevated FRAX. Continue Vitamin D + Calcium and increase exercise making sure to incorporate resistance training. Will schedule DXA now.   Postmenopausal atrophic vaginitis - Plan: estradiol (ESTRACE VAGINAL) 0.1 MG/GM vaginal cream twice weekly. Initially she will use nightly x 1 week, then ever other night x 1 week, then twice weekly. We discussed risk for small amount of systemic absorption. She would like to continue. She will also continue to use coconut oil.   Screening for cervical cancer - Cryosurgery many years ago, subsequent paps normal.  No longer screening per guidelines.   Screening for breast cancer -  Normal mammogram history.  Continue annual screenings.  Normal breast exam today. Mammogram scheduled 12/12.   Screening for colon cancer - Negative Cologuard 2022.   Return in 2 years for breast and pelvic exam.   Tamela Gammon DNP, 10:52 AM 12/01/2020

## 2020-12-14 ENCOUNTER — Other Ambulatory Visit: Payer: Self-pay

## 2020-12-14 ENCOUNTER — Ambulatory Visit
Admission: RE | Admit: 2020-12-14 | Discharge: 2020-12-14 | Disposition: A | Discharge status: Home / Self Care | Payer: PPO | Source: Ambulatory Visit | Attending: Nurse Practitioner | Admitting: Nurse Practitioner

## 2020-12-14 DIAGNOSIS — Z1231 Encounter for screening mammogram for malignant neoplasm of breast: Secondary | ICD-10-CM | POA: Diagnosis not present

## 2020-12-17 ENCOUNTER — Ambulatory Visit: Payer: PPO | Admitting: Nurse Practitioner

## 2020-12-18 ENCOUNTER — Ambulatory Visit: Payer: Self-pay | Admitting: Nurse Practitioner

## 2020-12-19 DIAGNOSIS — H6592 Unspecified nonsuppurative otitis media, left ear: Secondary | ICD-10-CM | POA: Diagnosis not present

## 2020-12-19 DIAGNOSIS — U071 COVID-19: Secondary | ICD-10-CM | POA: Diagnosis not present

## 2021-01-07 ENCOUNTER — Other Ambulatory Visit: Payer: Self-pay | Admitting: Nurse Practitioner

## 2021-01-07 ENCOUNTER — Telehealth: Payer: Self-pay

## 2021-01-07 ENCOUNTER — Encounter: Payer: Self-pay | Admitting: Nurse Practitioner

## 2021-01-07 ENCOUNTER — Ambulatory Visit (INDEPENDENT_AMBULATORY_CARE_PROVIDER_SITE_OTHER): Payer: PPO | Admitting: Nurse Practitioner

## 2021-01-07 VITALS — BP 139/79 | HR 71 | Temp 96.9°F | Resp 20 | Ht <= 58 in | Wt 120.0 lb

## 2021-01-07 DIAGNOSIS — E785 Hyperlipidemia, unspecified: Secondary | ICD-10-CM | POA: Diagnosis not present

## 2021-01-07 DIAGNOSIS — E559 Vitamin D deficiency, unspecified: Secondary | ICD-10-CM | POA: Diagnosis not present

## 2021-01-07 DIAGNOSIS — K449 Diaphragmatic hernia without obstruction or gangrene: Secondary | ICD-10-CM

## 2021-01-07 DIAGNOSIS — M8588 Other specified disorders of bone density and structure, other site: Secondary | ICD-10-CM | POA: Diagnosis not present

## 2021-01-07 DIAGNOSIS — K581 Irritable bowel syndrome with constipation: Secondary | ICD-10-CM | POA: Diagnosis not present

## 2021-01-07 DIAGNOSIS — K219 Gastro-esophageal reflux disease without esophagitis: Secondary | ICD-10-CM | POA: Diagnosis not present

## 2021-01-07 DIAGNOSIS — I10 Essential (primary) hypertension: Secondary | ICD-10-CM | POA: Diagnosis not present

## 2021-01-07 LAB — CBC WITH DIFFERENTIAL/PLATELET
Basophils Absolute: 0.1 10*3/uL (ref 0.0–0.2)
Basos: 1 %
EOS (ABSOLUTE): 0.4 10*3/uL (ref 0.0–0.4)
Eos: 5 %
Hematocrit: 40.9 % (ref 34.0–46.6)
Hemoglobin: 13.5 g/dL (ref 11.1–15.9)
Immature Grans (Abs): 0 10*3/uL (ref 0.0–0.1)
Immature Granulocytes: 0 %
Lymphocytes Absolute: 2.7 10*3/uL (ref 0.7–3.1)
Lymphs: 33 %
MCH: 30.5 pg (ref 26.6–33.0)
MCHC: 33 g/dL (ref 31.5–35.7)
MCV: 93 fL (ref 79–97)
Monocytes Absolute: 0.6 10*3/uL (ref 0.1–0.9)
Monocytes: 8 %
Neutrophils Absolute: 4.4 10*3/uL (ref 1.4–7.0)
Neutrophils: 53 %
Platelets: 227 10*3/uL (ref 150–450)
RBC: 4.42 x10E6/uL (ref 3.77–5.28)
RDW: 13.4 % (ref 11.7–15.4)
WBC: 8.1 10*3/uL (ref 3.4–10.8)

## 2021-01-07 LAB — CMP14+EGFR
ALT: 61 IU/L — ABNORMAL HIGH (ref 0–32)
AST: 47 IU/L — ABNORMAL HIGH (ref 0–40)
Albumin/Globulin Ratio: 1.8 (ref 1.2–2.2)
Albumin: 4.2 g/dL (ref 3.7–4.7)
Alkaline Phosphatase: 90 IU/L (ref 44–121)
BUN/Creatinine Ratio: 16 (ref 12–28)
BUN: 12 mg/dL (ref 8–27)
Bilirubin Total: 0.4 mg/dL (ref 0.0–1.2)
CO2: 23 mmol/L (ref 20–29)
Calcium: 9.5 mg/dL (ref 8.7–10.3)
Chloride: 102 mmol/L (ref 96–106)
Creatinine, Ser: 0.74 mg/dL (ref 0.57–1.00)
Globulin, Total: 2.4 g/dL (ref 1.5–4.5)
Glucose: 90 mg/dL (ref 70–99)
Potassium: 4.2 mmol/L (ref 3.5–5.2)
Sodium: 142 mmol/L (ref 134–144)
Total Protein: 6.6 g/dL (ref 6.0–8.5)
eGFR: 86 mL/min/{1.73_m2} (ref >59)

## 2021-01-07 LAB — LIPID PANEL
Chol/HDL Ratio: 4.1 ratio (ref 0.0–4.4)
Cholesterol, Total: 175 mg/dL (ref 100–199)
HDL: 43 mg/dL (ref >39)
LDL Chol Calc (NIH): 90 mg/dL (ref 0–99)
Triglycerides: 249 mg/dL — ABNORMAL HIGH (ref 0–149)
VLDL Cholesterol Cal: 42 mg/dL — ABNORMAL HIGH (ref 5–40)

## 2021-01-07 MED ORDER — AZITHROMYCIN 250 MG PO TABS: ORAL_TABLET | ORAL | 0 refills | Status: DC

## 2021-01-07 MED ORDER — METOPROLOL SUCCINATE ER 50 MG PO TB24: 50.0000 mg | ORAL_TABLET | Freq: Every day | ORAL | 1 refills | Status: DC

## 2021-01-07 MED ORDER — ATORVASTATIN CALCIUM 20 MG PO TABS: 20.0000 mg | ORAL_TABLET | Freq: Every day | ORAL | 1 refills | Status: DC

## 2021-01-07 MED ORDER — PANTOPRAZOLE SODIUM 40 MG PO TBEC: 40.0000 mg | DELAYED_RELEASE_TABLET | Freq: Every day | ORAL | 1 refills | Status: DC

## 2021-01-07 NOTE — Telephone Encounter (Signed)
Patient notified and verbalized understanding. 

## 2021-01-07 NOTE — Telephone Encounter (Signed)
Patient says since she got home she has a terrible headache and her teeth and face are hurting very bad. She is blowing out green stuff from her nose. Do you think she needs an antibiotic?

## 2021-01-07 NOTE — Progress Notes (Signed)
Subjective:    Patient ID: [name], female    DOB: Sep 13, 1950, 71 y.o.   MRN: [id number]   Chief Complaint: Medical Management of Chronic Issues    HPI:  [name] is a 71 y.o. who identifies as a female who was assigned female at birth.   Social history: Lives with: husband Work history: retired   Scientist, forensic in today for follow up of the following chronic medical issues:  1. Essential hypertension No c/o chest pain, sob or headache. Does not check blood pressure at home. BP Readings from Last 3 Encounters:  01/07/21 139/79  12/01/20 130/80  07/27/20 118/76      2. Hyperlipidemia with target LDL less than 100 does watch diet and stays very active. Lab Results  Component Value Date   CHOL 176 06/17/2020   HDL 47 06/17/2020   LDLCALC 100 (H) 06/17/2020   TRIG 166 (H) 06/17/2020   CHOLHDL 3.7 06/17/2020     3. Gastroesophageal reflux disease without esophagitis Is on protonix daily and that is working well for her.  4. Diaphragmatic hernia without obstruction and without gangrene No problems  5. Irritable bowel syndrome with constipation Has occasional constipation. She uses probiotics and that has really helped prevent episodes of constipation  6. Osteopenia of lumbar spine Last dexascan was done 03/14/18. Her t score was -2.4  7. Vitamin D deficiency Is on daily vitamind supplement   New complaints: None today  Allergies  Allergen Reactions   Codeine Other (See Comments)    GI upset   Sulfonamide Derivatives     REACTION: severe GI upset   Outpatient Encounter Medications as of 01/07/2021  Medication Sig   ALPRAZolam (XANAX) 0.25 MG tablet Take 1 tablet (0.25 mg total) by mouth at bedtime as needed.   aspirin 81 MG chewable tablet Chew by mouth daily.   atorvastatin (LIPITOR) 20 MG tablet Take 1 tablet (20 mg total) by mouth daily at 6 PM.   BIOTIN PO Take by mouth.   CALCIUM-MAGNESIUM PO Take by mouth.   Cranberry 400 MG CAPS Take by mouth.    LUMIGAN 0.01 % SOLN    metoprolol succinate (TOPROL-XL) 50 MG 24 hr tablet Take 1 tablet (50 mg total) by mouth daily. Take with or immediately following a meal.   pantoprazole (PROTONIX) 40 MG tablet Take 1 tablet (40 mg total) by mouth daily.   pyridOXINE (VITAMIN B-6) 100 MG tablet Take 100 mg by mouth daily.   estradiol (ESTRACE VAGINAL) 0.1 MG/GM vaginal cream Place 1 Applicatorful vaginally 2 (two) times a week. Initial dose: Use nightly x 1 week, then decease to every other night x 1 week, then twice weekly (Patient not taking: Reported on 01/07/2021)   No facility-administered encounter medications on file as of 01/07/2021.    Past Surgical History:  Procedure Laterality Date   CARDIAC CATHETERIZATION     COLPOSCOPY     excision of mole  2007   GYNECOLOGIC CRYOSURGERY     LAPAROSCOPIC CHOLECYSTECTOMY     TUBAL LIGATION  1991    Family History  Problem Relation Age of Onset   Hypertension Mother    Heart disease Mother    Hyperlipidemia Mother    Glaucoma Mother    Hypertension Father    Cancer Father        LUNG   Hyperlipidemia Father    Diabetes Sister    Hypertension Sister    Hyperlipidemia Sister    Fibromyalgia Daughter  Asthma Daughter       Controlled substance contract: n/a     Review of Systems  Constitutional:  Negative for diaphoresis.  Eyes:  Negative for pain.  Respiratory:  Negative for shortness of breath.   Cardiovascular:  Negative for chest pain, palpitations and leg swelling.  Gastrointestinal:  Negative for abdominal pain.  Endocrine: Negative for polydipsia.  Skin:  Negative for rash.  Neurological:  Negative for dizziness, weakness and headaches.  Hematological:  Does not bruise/bleed easily.  All other systems reviewed and are negative.     Objective:   Physical Exam Vitals and nursing note reviewed.  Constitutional:      General: She is not in acute distress.    Appearance: Normal appearance. She is well-developed.  HENT:      Head: Normocephalic.     Right Ear: Tympanic membrane normal.     Left Ear: Tympanic membrane normal.     Nose: Nose normal.     Mouth/Throat:     Mouth: Mucous membranes are moist.  Eyes:     Pupils: Pupils are equal, round, and reactive to light.  Neck:     Vascular: No carotid bruit or JVD.  Cardiovascular:     Rate and Rhythm: Normal rate and regular rhythm.     Heart sounds: Normal heart sounds.  Pulmonary:     Effort: Pulmonary effort is normal. No respiratory distress.     Breath sounds: Normal breath sounds. No wheezing or rales.  Chest:     Chest wall: No tenderness.  Abdominal:     General: Bowel sounds are normal. There is no distension or abdominal bruit.     Palpations: Abdomen is soft. There is no hepatomegaly, splenomegaly, mass or pulsatile mass.     Tenderness: There is no abdominal tenderness.  Musculoskeletal:        General: Normal range of motion.     Cervical back: Normal range of motion and neck supple.  Lymphadenopathy:     Cervical: No cervical adenopathy.  Skin:    General: Skin is warm and dry.  Neurological:     Mental Status: She is alert and oriented to person, place, and time.     Deep Tendon Reflexes: Reflexes are normal and symmetric.  Psychiatric:        Behavior: Behavior normal.        Thought Content: Thought content normal.        Judgment: Judgment normal.    BP 139/79    Pulse 71    Temp (!) 96.9 F (36.1 C) (Temporal)    Resp 20    Ht 4' 10"  (1.473 m)    Wt 120 lb (54.4 kg)    SpO2 99%    BMI 25.08 kg/m        Assessment & Plan:  CARMELITA AMPARO comes in today with chief complaint of Medical Management of Chronic Issues   Diagnosis and orders addressed:  1. Essential hypertension Low sodium deit - metoprolol succinate (TOPROL-XL) 50 MG 24 hr tablet; Take 1 tablet (50 mg total) by mouth daily. Take with or immediately following a meal.  Dispense: 90 tablet; Refill: 1 - CBC with Differential/Platelet - CMP14+EGFR  2.  Hyperlipidemia with target LDL less than 100 Low fat diet - atorvastatin (LIPITOR) 20 MG tablet; Take 1 tablet (20 mg total) by mouth daily at 6 PM.  Dispense: 90 tablet; Refill: 1 - Lipid panel  3. Gastroesophageal reflux disease without esophagitis Avoid spicy foods  Do not eat 2 hours prior to bedtime - pantoprazole (PROTONIX) 40 MG tablet; Take 1 tablet (40 mg total) by mouth daily.  Dispense: 90 tablet; Refill: 1  4. Diaphragmatic hernia without obstruction and without gangrene Do not over eat  5. Irritable bowel syndrome with constipation Continue probiotics daily  6. Osteopenia of lumbar spine Weight bearing exercises  7. Vitamin D deficiency Continue daily vitamin d supplement   Labs pending Health Maintenance reviewed Diet and exercise encouraged  Follow up plan: 6 months   Mary-Margaret Hassell Done, FNP

## 2021-01-07 NOTE — Patient Instructions (Signed)
Bone Health Bones protect organs, store calcium, anchor muscles, and support the whole body. Keeping your bones strong is important, especially as you get older. You can take actions to help keep your bones strong and healthy. Why is keeping my bones healthy important? Keeping your bones healthy is important because your body constantly replaces bone cells. Cells get old, and new cells take their place. As we age, we lose bone cells because the body may not be able to make enough new cells to replace the old cells. The amount of bone cells and bone tissue you have is referred to as bone mass. The higher your bone mass, the stronger your bones. The aging process leads to an overall loss of bone mass in the body, which can increase the likelihood of: Broken bones. A condition in which the bones become weak and brittle (osteoporosis). A large decline in bone mass occurs in older adults. In women, it occurs about the time of menopause. What actions can I take to keep my bones healthy? Good health habits are important for maintaining healthy bones. This includes eating nutritious foods and exercising regularly. To have healthy bones, you need to get enough of the right minerals and vitamins. Most nutrition experts recommend getting these nutrients from the foods that you eat. In some cases, taking supplements may also be recommended. Doing certain types of exercise is also important for bone health. What are the nutritional recommendations for healthy bones? Eating a well-balanced diet with plenty of calcium and vitamin D will help to protect your bones. Nutritional recommendations vary from person to person. Ask your health care provider what is healthy for you. Here are some general guidelines. Get enough calcium Calcium is the most important (essential) mineral for bone health. Most people can get enough calcium from their diet, but supplements may be recommended for people who are at risk for  osteoporosis. Good sources of calcium include: Dairy products, such as low-fat or nonfat milk, cheese, and yogurt. Dark green leafy vegetables, such as bok choy and broccoli. Foods that have calcium added to them (are fortified). Foods that may be fortified with calcium include orange juice, cereal, bread, soy beverages, and tofu products. Nuts, such as almonds. Follow these recommended amounts for daily calcium intake: Infants, 0-6 months: 200 mg. Infants, 6-12 months: 260 mg. Children, age 63-3: 700 mg. Children, age 64-8: 1,000 mg. Children, age 23-13: 1,300 mg. Teens, age 647-18: 1,300 mg. Adults, age 29-50: 1,000 mg. Adults, age 27-70: Men: 1,000 mg. Women: 1,200 mg. Adults, age 646 or older: 1,200 mg. Pregnant and breastfeeding females: Teens: 1,300 mg. Adults: 1,000 mg. Get enough vitamin D Vitamin D is the most essential vitamin for bone health. It helps the body absorb calcium. Sunlight stimulates the skin to make vitamin D, so be sure to get enough sunlight. If you live in a cold climate or you do not get outside often, your health care provider may recommend that you take vitamin D supplements. Good sources of vitamin D in your diet include: Egg yolks. Saltwater fish. Milk and cereal fortified with vitamin D. Follow these recommended amounts for daily vitamin D intake: Infants, 0-12 months: 400 international units (IU). Children and teens, age 63-18: 37 international units. Adults, age 54 or younger: 45 international units. Adults, age 644 or older: 5-1,000 international units. Get other important nutrients Other nutrients that are important for bone health include: Phosphorus. This mineral is found in meat, poultry, dairy foods, nuts, and legumes. The recommended daily  intake for adult men and adult women is 700 mg. Magnesium. This mineral is found in seeds, nuts, dark green vegetables, and legumes. The recommended daily intake for adult men is 400-420 mg. For adult women,  it is 310-320 mg. Vitamin K. This vitamin is found in green leafy vegetables. The recommended daily intake is 120 mcg for adult men and 90 mcg for adult women. What type of physical activity is best for building and maintaining healthy bones? Weight-bearing and strength-building activities are important for building and maintaining healthy bones. Weight-bearing activities cause muscles and bones to work against gravity. Strength-building activities increase the strength of the muscles that support bones. Weight-bearing and muscle-building activities include: Walking and hiking. Jogging and running. Dancing. Gym exercises. Lifting weights. Tennis and racquetball. Climbing stairs. Aerobics. Adults should get at least 30 minutes of moderate physical activity on most days. Children should get at least 60 minutes of moderate physical activity on most days. Ask your health care provider what type of exercise is best for you. How can I find out if my bone mass is low? Bone mass can be measured with an X-ray test called a bone mineral density (BMD) test. This test is recommended for all women who are age 80 or older. It may also be recommended for: Men who are age 37 or older. People who are at risk for osteoporosis because of: Having a long-term disease that weakens bones, such as kidney disease or rheumatoid arthritis. Having menopause earlier than normal. Taking medicine that weakens bones, such as steroids, thyroid hormones, or hormone treatment for breast cancer or prostate cancer. Smoking. Drinking three or more alcoholic drinks a day. Being underweight. Sedentary lifestyle. If you find that you have a low bone mass, you may be able to prevent osteoporosis or further bone loss by changing your diet and lifestyle. Where can I find more information? Bone Health & Osteoporosis Foundation: [employer] of Health: www.bones.[employer]Ex[employer].es International Osteoporosis  Foundation: Administrator.iofbonehealth.org Summary The aging process leads to an overall loss of bone mass in the body, which can increase the likelihood of broken bones and osteoporosis. Eating a well-balanced diet with plenty of calcium and vitamin D will help to protect your bones. Weight-bearing and strength-building activities are also important for building and maintaining strong bones. Bone mass can be measured with an X-ray test called a bone mineral density (BMD) test. This information is not intended to replace advice given to you by your health care provider. Make sure you discuss any questions you have with your health care provider. Document Revised: 06/03/2020 Document Reviewed: 06/03/2020 Elsevier Patient Education  [place].

## 2021-01-07 NOTE — Telephone Encounter (Signed)
Probably viral but I will sned in z pak for her

## 2021-03-11 DIAGNOSIS — H40023 Open angle with borderline findings, high risk, bilateral: Secondary | ICD-10-CM | POA: Diagnosis not present

## 2021-06-22 ENCOUNTER — Encounter: Payer: Self-pay | Admitting: Physician Assistant

## 2021-06-22 ENCOUNTER — Ambulatory Visit: Payer: PPO | Admitting: Physician Assistant

## 2021-06-22 DIAGNOSIS — Z86018 Personal history of other benign neoplasm: Secondary | ICD-10-CM

## 2021-06-22 DIAGNOSIS — L720 Epidermal cyst: Secondary | ICD-10-CM

## 2021-06-22 DIAGNOSIS — Z1283 Encounter for screening for malignant neoplasm of skin: Secondary | ICD-10-CM | POA: Diagnosis not present

## 2021-06-22 DIAGNOSIS — Z808 Family history of malignant neoplasm of other organs or systems: Secondary | ICD-10-CM

## 2021-06-22 NOTE — Progress Notes (Signed)
   Follow-Up Visit   Subjective  [name] is a 71 y.o. female who presents for the following: Annual Exam (No new concerns-Personal history of atypical nevi but no melanoma or non mole skin cancer.  Family history of non mole skin cancer but no melanoma. ).   The following portions of the chart were reviewed this encounter and updated as appropriate:  Tobacco  Allergies  Meds  Problems  Med Hx  Surg Hx  Fam Hx      Objective  Well appearing patient in no apparent distress; mood and affect are within normal limits.  A full examination was performed including scalp, head, eyes, ears, nose, lips, neck, chest, axillae, abdomen, back, buttocks, bilateral upper extremities, bilateral lower extremities, hands, feet, fingers, toes, fingernails, and toenails. All findings within normal limits unless otherwise noted below.  Mid Forehead, Right Forehead White papules   Assessment & Plan  Milia (2) Mid Forehead; Right Forehead  observe   No atypical nevi noted at the time of the visit.  I, Masa Lubin, PA-C, have reviewed all documentation's for this visit.  The documentation on 06/22/21 for the exam, diagnosis, procedures and orders are all accurate and complete.

## 2021-06-29 ENCOUNTER — Ambulatory Visit (INDEPENDENT_AMBULATORY_CARE_PROVIDER_SITE_OTHER): Payer: PPO | Admitting: Nurse Practitioner

## 2021-06-29 ENCOUNTER — Other Ambulatory Visit: Payer: Self-pay | Admitting: Nurse Practitioner

## 2021-06-29 ENCOUNTER — Encounter: Payer: Self-pay | Admitting: Nurse Practitioner

## 2021-06-29 VITALS — BP 116/75 | HR 61 | Temp 96.9°F | Resp 20 | Ht <= 58 in | Wt 117.0 lb

## 2021-06-29 DIAGNOSIS — K219 Gastro-esophageal reflux disease without esophagitis: Secondary | ICD-10-CM

## 2021-06-29 DIAGNOSIS — I10 Essential (primary) hypertension: Secondary | ICD-10-CM

## 2021-06-29 DIAGNOSIS — F411 Generalized anxiety disorder: Secondary | ICD-10-CM

## 2021-06-29 DIAGNOSIS — K222 Esophageal obstruction: Secondary | ICD-10-CM

## 2021-06-29 DIAGNOSIS — K449 Diaphragmatic hernia without obstruction or gangrene: Secondary | ICD-10-CM | POA: Diagnosis not present

## 2021-06-29 DIAGNOSIS — E559 Vitamin D deficiency, unspecified: Secondary | ICD-10-CM

## 2021-06-29 DIAGNOSIS — E785 Hyperlipidemia, unspecified: Secondary | ICD-10-CM

## 2021-06-29 DIAGNOSIS — Z23 Encounter for immunization: Secondary | ICD-10-CM

## 2021-06-29 DIAGNOSIS — M8588 Other specified disorders of bone density and structure, other site: Secondary | ICD-10-CM

## 2021-06-29 MED ORDER — ATORVASTATIN CALCIUM 20 MG PO TABS: 20.0000 mg | ORAL_TABLET | Freq: Every day | ORAL | 1 refills | Status: DC

## 2021-06-29 MED ORDER — METOPROLOL SUCCINATE ER 50 MG PO TB24: 50.0000 mg | ORAL_TABLET | Freq: Every day | ORAL | 1 refills | Status: DC

## 2021-06-29 MED ORDER — PANTOPRAZOLE SODIUM 40 MG PO TBEC: 40.0000 mg | DELAYED_RELEASE_TABLET | Freq: Every day | ORAL | 1 refills | Status: DC

## 2021-06-30 LAB — CBC WITH DIFFERENTIAL/PLATELET
Basophils Absolute: 0 10*3/uL (ref 0.0–0.2)
Basos: 0 %
EOS (ABSOLUTE): 0.2 10*3/uL (ref 0.0–0.4)
Eos: 3 %
Hematocrit: 39.6 % (ref 34.0–46.6)
Hemoglobin: 13.4 g/dL (ref 11.1–15.9)
Immature Grans (Abs): 0 10*3/uL (ref 0.0–0.1)
Immature Granulocytes: 0 %
Lymphocytes Absolute: 2.7 10*3/uL (ref 0.7–3.1)
Lymphs: 34 %
MCH: 31.1 pg (ref 26.6–33.0)
MCHC: 33.8 g/dL (ref 31.5–35.7)
MCV: 92 fL (ref 79–97)
Monocytes Absolute: 0.6 10*3/uL (ref 0.1–0.9)
Monocytes: 7 %
Neutrophils Absolute: 4.5 10*3/uL (ref 1.4–7.0)
Neutrophils: 56 %
Platelets: 200 10*3/uL (ref 150–450)
RBC: 4.31 x10E6/uL (ref 3.77–5.28)
RDW: 13 % (ref 11.7–15.4)
WBC: 8 10*3/uL (ref 3.4–10.8)

## 2021-06-30 LAB — CMP14+EGFR
ALT: 25 IU/L (ref 0–32)
AST: 24 IU/L (ref 0–40)
Albumin/Globulin Ratio: 1.8 (ref 1.2–2.2)
Albumin: 4.4 g/dL (ref 3.7–4.7)
Alkaline Phosphatase: 86 IU/L (ref 44–121)
BUN/Creatinine Ratio: 21 (ref 12–28)
BUN: 15 mg/dL (ref 8–27)
Bilirubin Total: 0.4 mg/dL (ref 0.0–1.2)
CO2: 23 mmol/L (ref 20–29)
Calcium: 9.6 mg/dL (ref 8.7–10.3)
Chloride: 99 mmol/L (ref 96–106)
Creatinine, Ser: 0.73 mg/dL (ref 0.57–1.00)
Globulin, Total: 2.4 g/dL (ref 1.5–4.5)
Glucose: 78 mg/dL (ref 70–99)
Potassium: 4 mmol/L (ref 3.5–5.2)
Sodium: 138 mmol/L (ref 134–144)
Total Protein: 6.8 g/dL (ref 6.0–8.5)
eGFR: 87 mL/min/{1.73_m2} (ref >59)

## 2021-06-30 LAB — LIPID PANEL
Chol/HDL Ratio: 3.8 ratio (ref 0.0–4.4)
Cholesterol, Total: 169 mg/dL (ref 100–199)
HDL: 45 mg/dL (ref >39)
LDL Chol Calc (NIH): 92 mg/dL (ref 0–99)
Triglycerides: 186 mg/dL — ABNORMAL HIGH (ref 0–149)
VLDL Cholesterol Cal: 32 mg/dL (ref 5–40)

## 2021-06-30 NOTE — Addendum Note (Signed)
Addended by: Rolena Infante on: 06/30/2021 08:47 AM   Modules accepted: Orders

## 2021-07-02 LAB — TOXASSURE SELECT 13 (MW), URINE

## 2021-07-09 ENCOUNTER — Ambulatory Visit: Payer: PPO | Admitting: Nurse Practitioner

## 2021-07-15 ENCOUNTER — Encounter: Payer: Self-pay | Admitting: Family

## 2021-07-15 ENCOUNTER — Ambulatory Visit (INDEPENDENT_AMBULATORY_CARE_PROVIDER_SITE_OTHER): Payer: PPO | Admitting: Family

## 2021-07-15 DIAGNOSIS — R399 Unspecified symptoms and signs involving the genitourinary system: Secondary | ICD-10-CM | POA: Diagnosis not present

## 2021-07-15 DIAGNOSIS — R300 Dysuria: Secondary | ICD-10-CM

## 2021-07-15 LAB — MICROSCOPIC EXAMINATION: Renal Epithel, UA: NONE SEEN /hpf

## 2021-07-15 LAB — URINALYSIS, ROUTINE W REFLEX MICROSCOPIC
Bilirubin, UA: NEGATIVE
Glucose, UA: NEGATIVE
Ketones, UA: NEGATIVE
Leukocytes,UA: NEGATIVE
Nitrite, UA: NEGATIVE
Specific Gravity, UA: 1.015 (ref 1.005–1.030)
Urobilinogen, Ur: 0.2 mg/dL (ref 0.2–1.0)
pH, UA: 7 (ref 5.0–7.5)

## 2021-07-15 MED ORDER — CEPHALEXIN 500 MG PO CAPS: 500.0000 mg | ORAL_CAPSULE | Freq: Two times a day (BID) | ORAL | 0 refills | Status: DC

## 2021-07-15 NOTE — Progress Notes (Signed)
Virtual Visit  Note Due to COVID-19 pandemic this visit was conducted virtually. This visit type was conducted due to national recommendations for restrictions regarding the COVID-19 Pandemic (e.g. social distancing, sheltering in place) in an effort to limit this patient's exposure and mitigate transmission in our community. All issues noted in this document were discussed and addressed.  A physical exam was not performed with this format.  I connected with [name] on 07/15/21 at 1:34 pm  by telephone and verified that I am speaking with the correct person using two identifiers. [name] is currently located at home and no one is currently with her during visit. The provider, Evelina Dun, FNP is located in their office at time of visit.  I discussed the limitations, risks, security and privacy concerns of performing an evaluation and management service by telephone and the availability of in person appointments. I also discussed with the patient that there may be a patient responsible charge related to this service. The patient expressed understanding and agreed to proceed.  [name], [name] are scheduled for a virtual visit with your provider today.    Just as we do with appointments in the office, we must obtain your consent to participate.  Your consent will be active for this visit and any virtual visit you may have with one of our providers in the next 365 days.    If you have a MyChart account, I can also send a copy of this consent to you electronically.  All virtual visits are billed to your insurance company just like a traditional visit in the office.  As this is a virtual visit, video technology does not allow for your provider to perform a traditional examination.  This may limit your provider's ability to fully assess your condition.  If your provider identifies any concerns that need to be evaluated in person or the need to arrange testing such as labs, EKG, etc, we will make  arrangements to do so.    Although advances in technology are sophisticated, we cannot ensure that it will always work on either your end or our end.  If the connection with a video visit is poor, we may have to switch to a telephone visit.  With either a video or telephone visit, we are not always able to ensure that we have a secure connection.   I need to obtain your verbal consent now.   Are you willing to proceed with your visit today?   [name] has provided verbal consent on 07/15/2021 for a virtual visit (video or telephone).   Evelina Dun, [place] 07/15/2021  1:35 PM   History and Present Illness:  Dysuria  This is a new problem. The current episode started in the past 7 days. The problem occurs intermittently. The problem has been unchanged. The quality of the pain is described as burning (pressure). The pain is at a severity of 6/10. The pain is mild. Associated symptoms include flank pain, frequency, hesitancy and urgency. Pertinent negatives include no discharge, hematuria or nausea. She has tried increased fluids for the symptoms. The treatment provided mild relief.      Review of Systems  Gastrointestinal:  Negative for nausea.  Genitourinary:  Positive for dysuria, flank pain, frequency, hesitancy and urgency. Negative for hematuria.     Observations/Objective: No SOB or distress noted   Assessment and Plan: 1. Dysuria  - Urinalysis, Routine w reflex microscopic  2. UTI symptoms Force fluids AZO over the  counter X2 days RTO if symptoms worsen or do not improve  Culture pending - cephALEXin (KEFLEX) 500 MG capsule; Take 1 capsule (500 mg total) by mouth 2 (two) times daily.  Dispense: 14 capsule; Refill: 0    I discussed the assessment and treatment plan with the patient. The patient was provided an opportunity to ask questions and all were answered. The patient agreed with the plan and demonstrated an understanding of the instructions.   The patient was  advised to call back or seek an in-person evaluation if the symptoms worsen or if the condition fails to improve as anticipated.  The above assessment and management plan was discussed with the patient. The patient verbalized understanding of and has agreed to the management plan. Patient is aware to call the clinic if symptoms persist or worsen. Patient is aware when to return to the clinic for a follow-up visit. Patient educated on when it is appropriate to go to the emergency department.   Time call ended:  1:45 pm   I provided 11 minutes of  non face-to-face time during this encounter.    Evelina Dun, FNP

## 2021-07-15 NOTE — Patient Instructions (Signed)
Urinary Tract Infection, Adult  A urinary tract infection (UTI) is an infection of any part of the urinary tract. The urinary tract includes the kidneys, ureters, bladder, and urethra. These organs make, store, and get rid of urine in the body. An upper UTI affects the ureters and kidneys. A lower UTI affects the bladder and urethra. What are the causes? Most urinary tract infections are caused by bacteria in your genital area around your urethra, where urine leaves your body. These bacteria grow and cause inflammation of your urinary tract. What increases the risk? You are more likely to develop this condition if: You have a urinary catheter that stays in place. You are not able to control when you urinate or have a bowel movement (incontinence). You are female and you: Use a spermicide or diaphragm for birth control. Have low estrogen levels. Are pregnant. You have certain genes that increase your risk. You are sexually active. You take antibiotic medicines. You have a condition that causes your flow of urine to slow down, such as: An enlarged prostate, if you are female. Blockage in your urethra. A kidney stone. A nerve condition that affects your bladder control (neurogenic bladder). Not getting enough to drink, or not urinating often. You have certain medical conditions, such as: Diabetes. A weak disease-fighting system (immunesystem). Sickle cell disease. Gout. Spinal cord injury. What are the signs or symptoms? Symptoms of this condition include: Needing to urinate right away (urgency). Frequent urination. This may include small amounts of urine each time you urinate. Pain or burning with urination. Blood in the urine. Urine that smells bad or unusual. Trouble urinating. Cloudy urine. Vaginal discharge, if you are female. Pain in the abdomen or the lower back. You may also have: Vomiting or a decreased appetite. Confusion. Irritability or tiredness. A fever or  chills. Diarrhea. The first symptom in older adults may be confusion. In some cases, they may not have any symptoms until the infection has worsened. How is this diagnosed? This condition is diagnosed based on your medical history and a physical exam. You may also have other tests, including: Urine tests. Blood tests. Tests for STIs (sexually transmitted infections). If you have had more than one UTI, a cystoscopy or imaging studies may be done to determine the cause of the infections. How is this treated? Treatment for this condition includes: Antibiotic medicine. Over-the-counter medicines to treat discomfort. Drinking enough water to stay hydrated. If you have frequent infections or have other conditions such as a kidney stone, you may need to see a health care provider who specializes in the urinary tract (urologist). In rare cases, urinary tract infections can cause sepsis. Sepsis is a life-threatening condition that occurs when the body responds to an infection. Sepsis is treated in the hospital with IV antibiotics, fluids, and other medicines. Follow these instructions at home:  Medicines Take over-the-counter and prescription medicines only as told by your health care provider. If you were prescribed an antibiotic medicine, take it as told by your health care provider. Do not stop using the antibiotic even if you start to feel better. General instructions Make sure you: Empty your bladder often and completely. Do not hold urine for long periods of time. Empty your bladder after sex. Wipe from front to back after urinating or having a bowel movement if you are female. Use each tissue only one time when you wipe. Drink enough fluid to keep your urine pale yellow. Keep all follow-up visits. This is important. Contact a health   care provider if: Your symptoms do not get better after 1-2 days. Your symptoms go away and then return. Get help right away if: You have severe pain in  your back or your lower abdomen. You have a fever or chills. You have nausea or vomiting. Summary A urinary tract infection (UTI) is an infection of any part of the urinary tract, which includes the kidneys, ureters, bladder, and urethra. Most urinary tract infections are caused by bacteria in your genital area. Treatment for this condition often includes antibiotic medicines. If you were prescribed an antibiotic medicine, take it as told by your health care provider. Do not stop using the antibiotic even if you start to feel better. Keep all follow-up visits. This is important. This information is not intended to replace advice given to you by your health care provider. Make sure you discuss any questions you have with your health care provider. Document Revised: 08/02/2019 Document Reviewed: 08/02/2019 Elsevier Patient Education  2023 Elsevier Inc.  

## 2021-07-17 LAB — URINE CULTURE: Organism ID, Bacteria: NO GROWTH

## 2021-08-06 ENCOUNTER — Ambulatory Visit (INDEPENDENT_AMBULATORY_CARE_PROVIDER_SITE_OTHER): Payer: PPO

## 2021-08-06 ENCOUNTER — Ambulatory Visit (INDEPENDENT_AMBULATORY_CARE_PROVIDER_SITE_OTHER): Payer: PPO | Admitting: Nurse Practitioner

## 2021-08-06 ENCOUNTER — Encounter: Payer: Self-pay | Admitting: Nurse Practitioner

## 2021-08-06 VITALS — BP 129/79 | HR 73 | Temp 97.9°F | Resp 20 | Ht <= 58 in | Wt 117.0 lb

## 2021-08-06 DIAGNOSIS — R0781 Pleurodynia: Secondary | ICD-10-CM

## 2021-08-06 DIAGNOSIS — Z87440 Personal history of urinary (tract) infections: Secondary | ICD-10-CM | POA: Diagnosis not present

## 2021-08-06 LAB — MICROSCOPIC EXAMINATION
Bacteria, UA: NONE SEEN
Renal Epithel, UA: NONE SEEN /hpf

## 2021-08-06 LAB — URINALYSIS, COMPLETE
Bilirubin, UA: NEGATIVE
Glucose, UA: NEGATIVE
Leukocytes,UA: NEGATIVE
Nitrite, UA: NEGATIVE
Specific Gravity, UA: 1.025 (ref 1.005–1.030)
Urobilinogen, Ur: 0.2 mg/dL (ref 0.2–1.0)
pH, UA: 5 (ref 5.0–7.5)

## 2021-08-06 NOTE — Progress Notes (Signed)
Subjective:    Patient ID: [name], female    DOB: 30-Sep-1950, 71 y.o.   MRN: [id number]  Chief Complaint: Pain and soreness in left side and Urinary pressure   HPI Patient comes in c/o left flank pain along lower ribs. Has come about in the last several months.    Review of Systems  Constitutional:  Negative for diaphoresis.  Eyes:  Negative for pain.  Respiratory:  Negative for shortness of breath.   Cardiovascular:  Negative for chest pain, palpitations and leg swelling.  Gastrointestinal:  Negative for abdominal pain, constipation, nausea and vomiting.  Endocrine: Negative for polydipsia.  Genitourinary:  Negative for dysuria and frequency.  Skin:  Negative for rash.  Neurological:  Negative for dizziness, weakness and headaches.  Hematological:  Does not bruise/bleed easily.  All other systems reviewed and are negative.      Objective:   Physical Exam Vitals and nursing note reviewed.  Constitutional:      General: She is not in acute distress.    Appearance: Normal appearance. She is well-developed.  Neck:     Vascular: No carotid bruit or JVD.  Cardiovascular:     Rate and Rhythm: Normal rate and regular rhythm.     Heart sounds: Normal heart sounds.  Pulmonary:     Effort: Pulmonary effort is normal. No respiratory distress.     Breath sounds: Normal breath sounds. No wheezing or rales.  Chest:     Chest wall: No tenderness.  Abdominal:     General: Bowel sounds are normal. There is no distension or abdominal bruit.     Palpations: Abdomen is soft. There is no hepatomegaly, splenomegaly, mass or pulsatile mass.     Tenderness: There is no abdominal tenderness.  Musculoskeletal:        General: Normal range of motion.     Cervical back: Normal range of motion and neck supple.  Lymphadenopathy:     Cervical: No cervical adenopathy.  Skin:    General: Skin is warm and dry.  Neurological:     Mental Status: She is alert and oriented to person, place,  and time.     Deep Tendon Reflexes: Reflexes are normal and symmetric.  Psychiatric:        Behavior: Behavior normal.        Thought Content: Thought content normal.        Judgment: Judgment normal.    BP 129/79   Pulse 73   Temp 97.9 F (36.6 C) (Temporal)   Resp 20   Ht '4\' 10"'$  (1.473 m)   Wt 117 lb (53.1 kg)   SpO2 97%   BMI 24.45 kg/m         Assessment & Plan:  [name] in today with chief complaint of Pain and soreness in left side and Urinary pressure   1. Recent urinary tract infection Urine clear Continue to force fluids - Urinalysis, Complete  2. Rib pain Unchanged from  previous studies in 2017- will wait on chest xray report - DG Ribs Unilateral W/Chest Left    The above assessment and management plan was discussed with the patient. The patient verbalized understanding of and has agreed to the management plan. Patient is aware to call the clinic if symptoms persist or worsen. Patient is aware when to return to the clinic for a follow-up visit. Patient educated on when it is appropriate to go to the emergency department.   Mary-Margaret Hassell Done, FNP

## 2021-08-06 NOTE — Patient Instructions (Signed)
   Chest Wall Pain Chest wall pain is pain in or around the bones and muscles of your chest. Chest wall pain may be caused by: An injury. Coughing a lot. Using your chest and arm muscles too much. Sometimes, the cause may not be known. This pain may take a few weeks or longer to get better. Follow these instructions at home: Managing pain, stiffness, and swelling If told, put ice on the painful area: Put ice in a plastic bag. Place a towel between your skin and the bag. Leave the ice on for 20 minutes, 2-3 times a day.  Activity Rest as told by your doctor. Avoid doing things that cause pain. This includes lifting heavy items. Ask your doctor what activities are safe for you. General instructions  Take over-the-counter and prescription medicines only as told by your doctor. Do not use any products that contain nicotine or tobacco, such as cigarettes, e-cigarettes, and chewing tobacco. If you need help quitting, ask your doctor. Keep all follow-up visits as told by your doctor. This is important. Contact a doctor if: You have a fever. Your chest pain gets worse. You have new symptoms. Get help right away if: You feel sick to your stomach (nauseous) or you throw up (vomit). You feel sweaty or light-headed. You have a cough with mucus from your lungs (sputum) or you cough up blood. You are short of breath. These symptoms may be an emergency. Do not wait to see if the symptoms will go away. Get medical help right away. Call your local emergency services (911 in the [place] Do not drive yourself to the hospital. Summary Chest wall pain is pain in or around the bones and muscles of your chest. It may be treated with ice, rest, and medicines. Your condition may also get better if you avoid doing things that cause pain. Contact a doctor if you have a fever, chest pain that gets worse, or new symptoms. Get help right away if you feel light-headed or you get short of breath. These symptoms  may be an emergency. This information is not intended to replace advice given to you by your health care provider. Make sure you discuss any questions you have with your health care provider. Document Revised: 03/24/2020 Document Reviewed: 03/06/2020 Elsevier Patient Education  2023 Elsevier Inc.  

## 2021-09-13 IMAGING — MG DIGITAL SCREENING BILAT W/ TOMO W/ CAD
8 series · 9 of 24 positions shown · non-contrast
Comparison: Previous exam(s).

CLINICAL DATA: Screening.

EXAM:
DIGITAL SCREENING BILATERAL MAMMOGRAM WITH TOMO AND CAD

[R CC synth-2D]
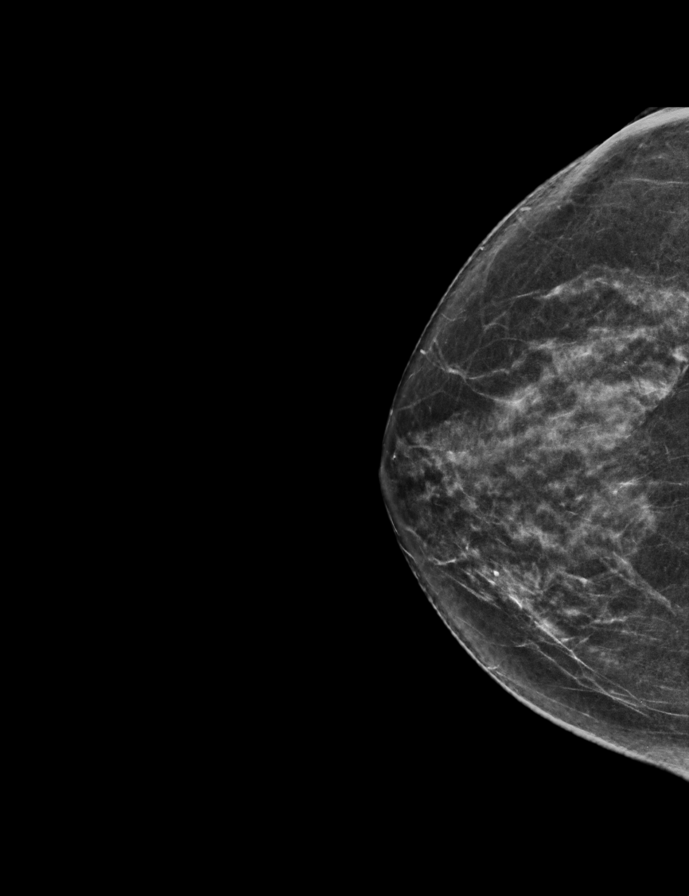

[R MLO synth-2D]
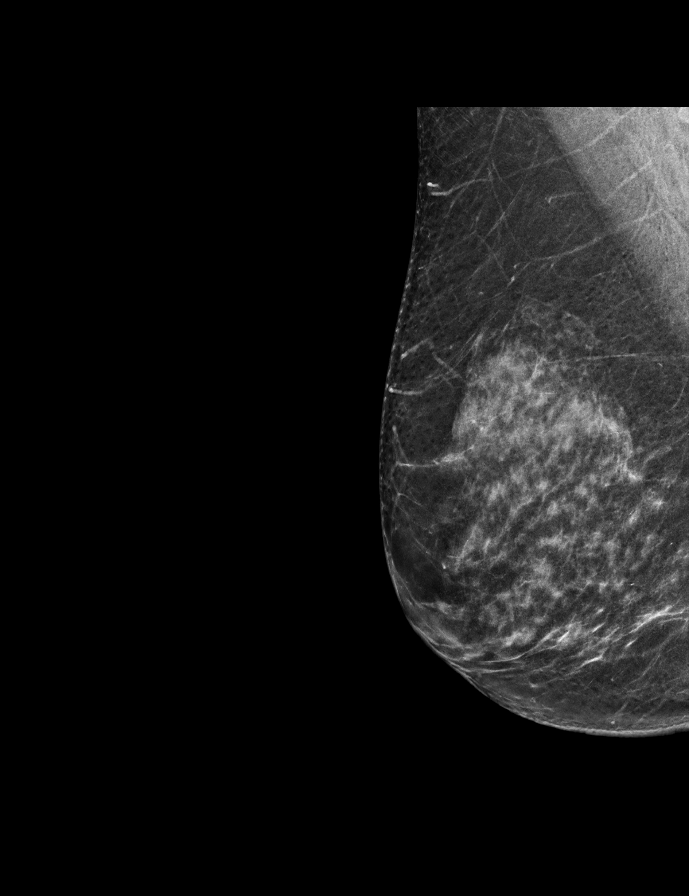

[L CC synth-2D]
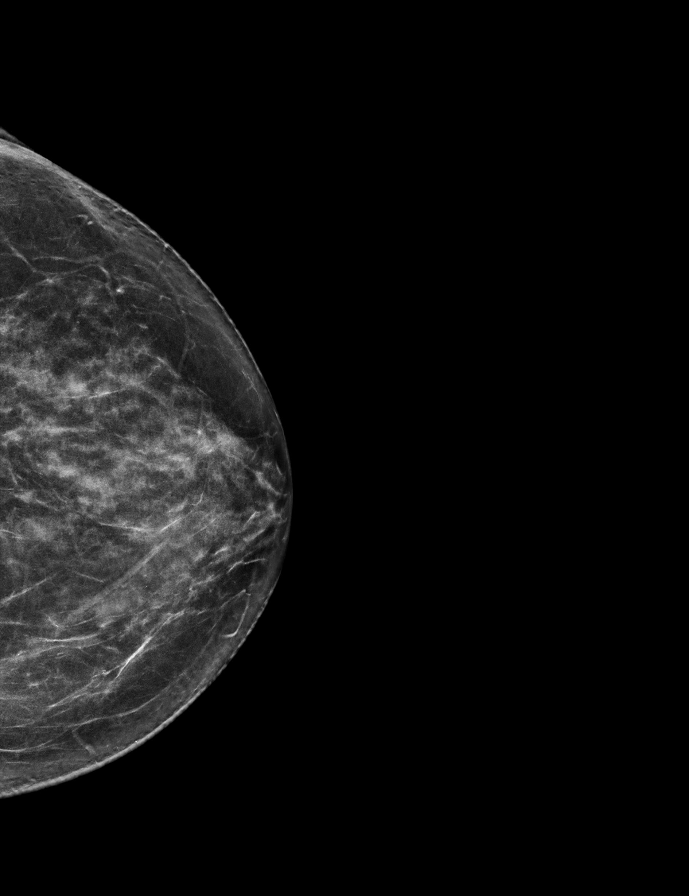

[L MLO synth-2D]
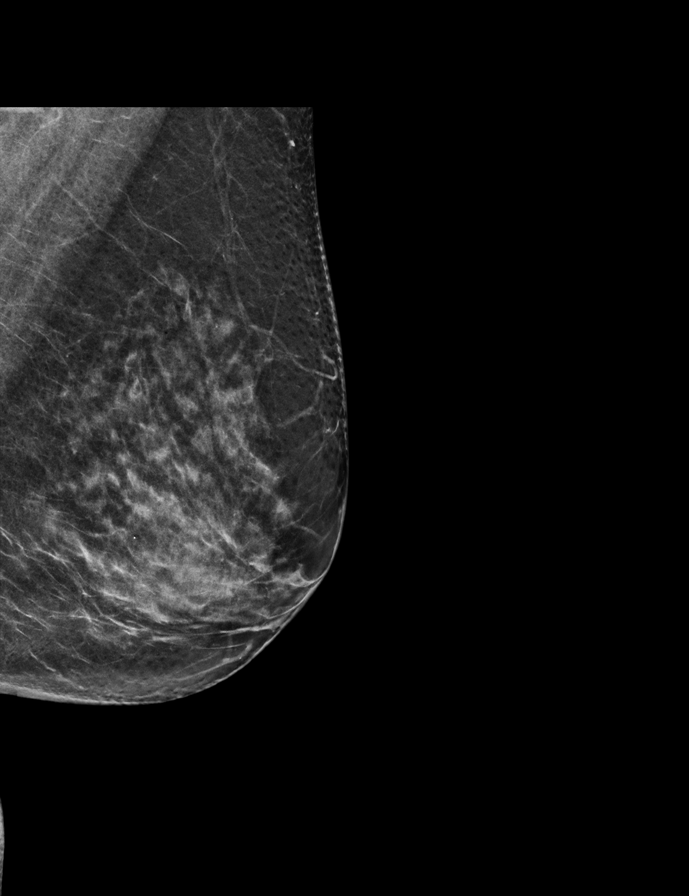

[L CC tomo · 2 of 61 frames shown]
[frame 20/61]
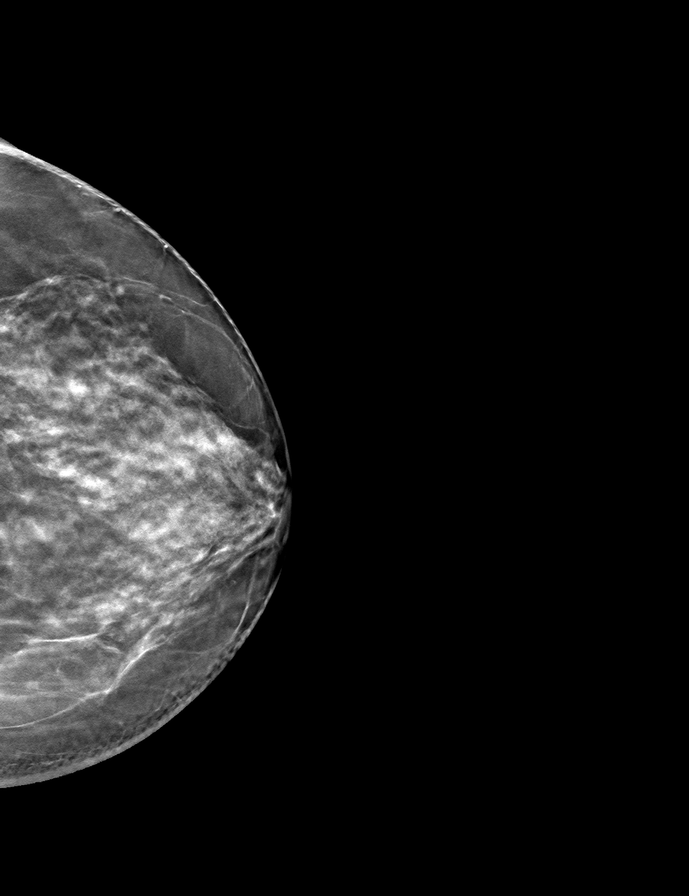
[frame 31/61]
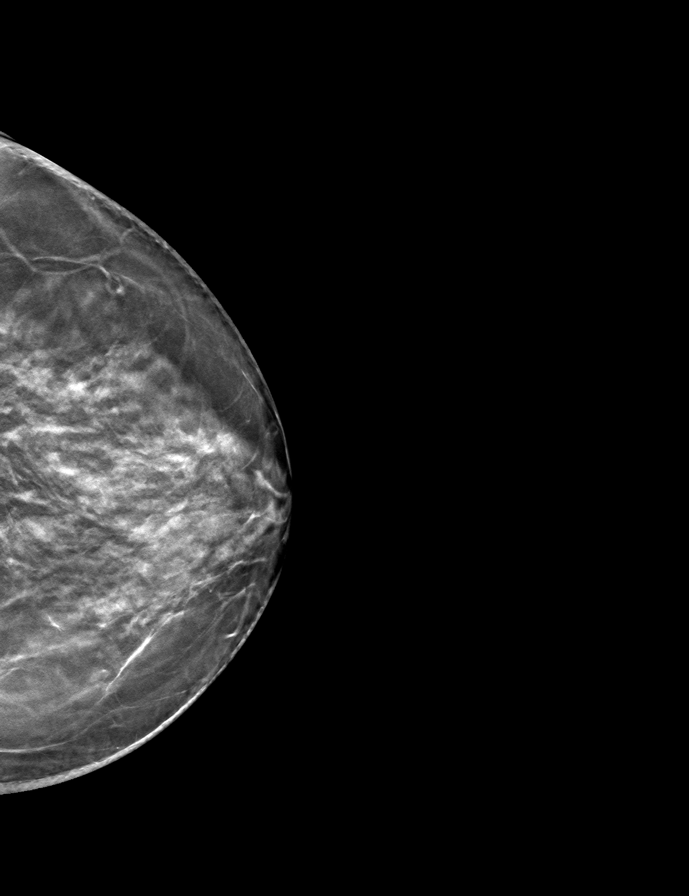

[R MLO tomo · tomo slice 32/63.0]
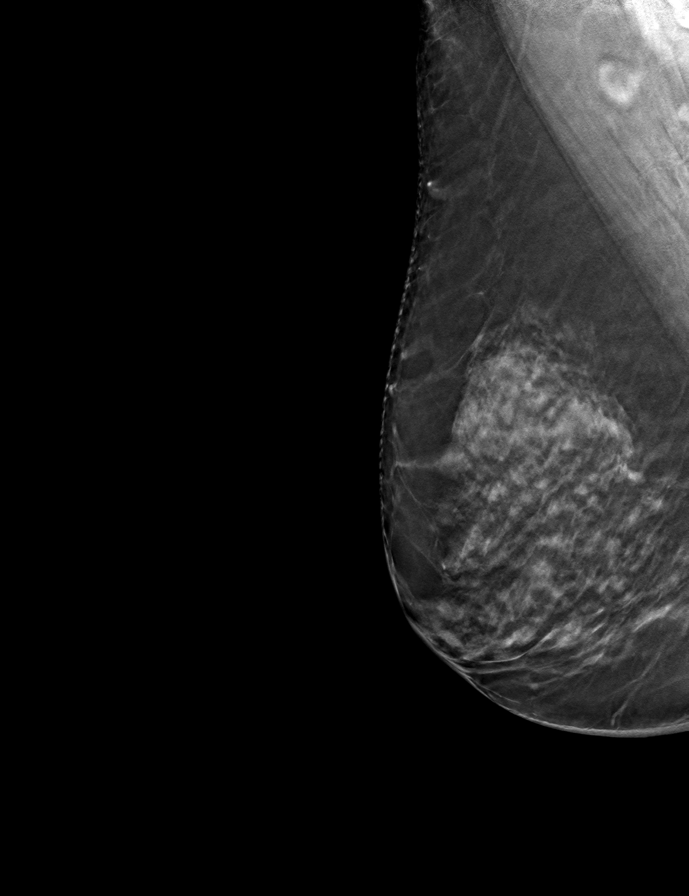

[L MLO tomo · tomo slice 33/65.0]
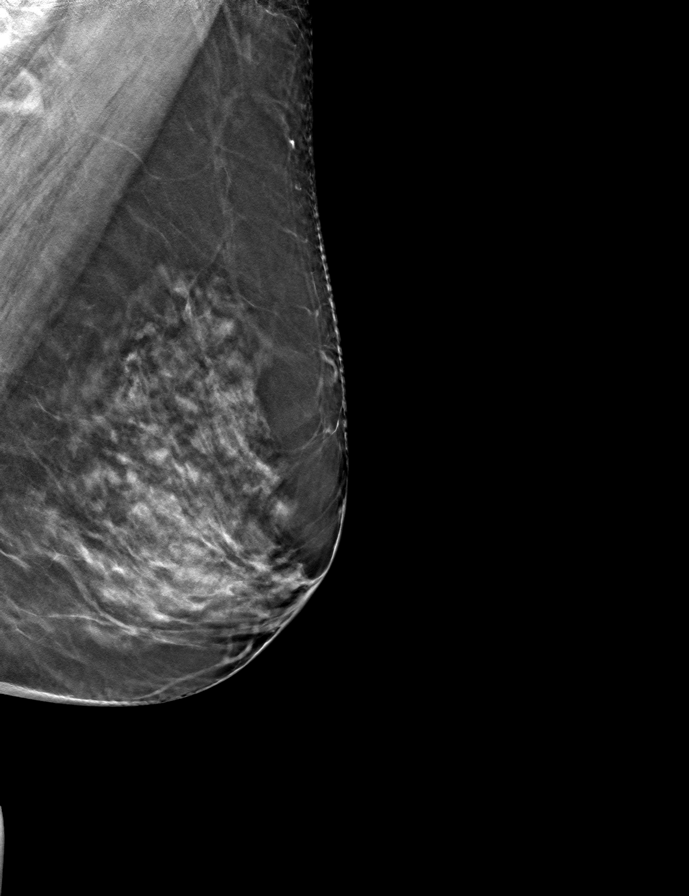

[R CC tomo · tomo slice 32/63.0]
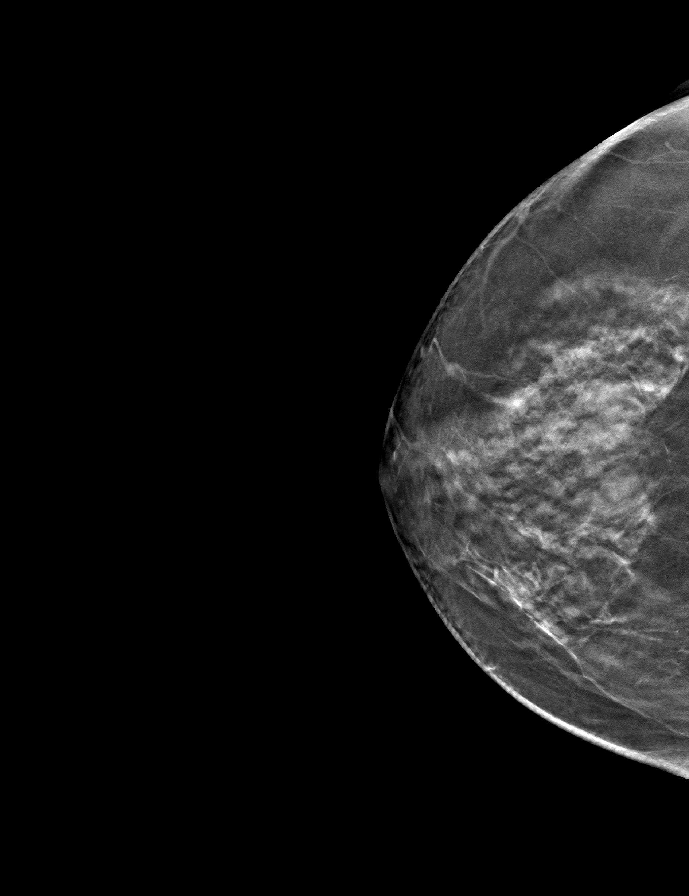

[9 of 24 positions shown; findings below may reference images not displayed]

ACR Breast Density Category c: The breast tissue is heterogeneously
dense, which may obscure small masses.
FINDINGS: There are no findings suspicious for malignancy. Images were
processed with CAD.
IMPRESSION: No mammographic evidence of malignancy. A result letter of this
screening mammogram will be mailed directly to the patient.

RECOMMENDATION:
Screening mammogram in one year. (Code:[id number])

BI-RADS CATEGORY  1: Negative.

## 2021-09-16 ENCOUNTER — Encounter: Payer: Self-pay | Admitting: Nurse Practitioner

## 2021-09-16 ENCOUNTER — Ambulatory Visit: Payer: PPO | Admitting: Nurse Practitioner

## 2021-09-16 VITALS — BP 108/78 | HR 75

## 2021-09-16 DIAGNOSIS — B3731 Acute candidiasis of vulva and vagina: Secondary | ICD-10-CM

## 2021-09-16 DIAGNOSIS — N898 Other specified noninflammatory disorders of vagina: Secondary | ICD-10-CM | POA: Diagnosis not present

## 2021-09-16 LAB — WET PREP FOR TRICH, YEAST, CLUE

## 2021-09-16 MED ORDER — FLUCONAZOLE 150 MG PO TABS: 150.0000 mg | ORAL_TABLET | ORAL | 0 refills | Status: DC

## 2021-09-16 NOTE — Progress Notes (Signed)
   Acute Office Visit  Subjective:    Patient ID: [name], female    DOB: 14-May-1950, 71 y.o.   MRN: [id number]   HPI 71 y.o. presents today for vulvovaginal itching and irritation. Treated for suspected UTI in July. Symptoms started after. Used OTC monistat with some relief but symptoms returned.   Review of Systems  Constitutional: Negative.   Genitourinary:  Positive for vaginal pain (Itching, irritation). Negative for difficulty urinating, dysuria, flank pain, frequency, genital sores, hematuria, urgency and vaginal discharge.       Objective:    Physical Exam Constitutional:      Appearance: Normal appearance.  Genitourinary:    General: Normal vulva.     Vagina: Normal.     Comments: Atrophic changes    BP 108/78   Pulse 75   SpO2 99%  Wt Readings from Last 3 Encounters:  08/06/21 117 lb (53.1 kg)  06/29/21 117 lb (53.1 kg)  01/07/21 120 lb (54.4 kg)        Patient informed chaperone available to be present for breast and/or pelvic exam. Patient has requested no chaperone to be present. Patient has been advised what will be completed during breast and pelvic exam.   Wet prep + yeast  Assessment & Plan:   Problem List Items Addressed This Visit   None Visit Diagnoses     Vaginal candidiasis    -  Primary   Relevant Medications   fluconazole (DIFLUCAN) 150 MG tablet   Vaginal itching       Relevant Orders   WET PREP FOR TRICH, YEAST, CLUE      Plan: Wet prep positive for yeast - Diflucan 150 mg today and repeat in 3 days for total of 2 doses.      Tamela Gammon DNP, 9:09 AM 09/16/2021

## 2021-09-23 ENCOUNTER — Other Ambulatory Visit: Payer: Self-pay | Admitting: Nurse Practitioner

## 2021-09-23 DIAGNOSIS — E785 Hyperlipidemia, unspecified: Secondary | ICD-10-CM

## 2021-09-23 DIAGNOSIS — I10 Essential (primary) hypertension: Secondary | ICD-10-CM

## 2021-09-23 DIAGNOSIS — K219 Gastro-esophageal reflux disease without esophagitis: Secondary | ICD-10-CM

## 2021-10-11 DIAGNOSIS — H2513 Age-related nuclear cataract, bilateral: Secondary | ICD-10-CM | POA: Diagnosis not present

## 2021-10-11 DIAGNOSIS — H401131 Primary open-angle glaucoma, bilateral, mild stage: Secondary | ICD-10-CM | POA: Diagnosis not present

## 2021-10-11 DIAGNOSIS — H5213 Myopia, bilateral: Secondary | ICD-10-CM | POA: Diagnosis not present

## 2021-10-28 ENCOUNTER — Other Ambulatory Visit: Payer: Self-pay | Admitting: Nurse Practitioner

## 2021-10-28 DIAGNOSIS — Z1231 Encounter for screening mammogram for malignant neoplasm of breast: Secondary | ICD-10-CM

## 2021-11-03 ENCOUNTER — Ambulatory Visit (INDEPENDENT_AMBULATORY_CARE_PROVIDER_SITE_OTHER): Payer: PPO

## 2021-11-03 DIAGNOSIS — Z23 Encounter for immunization: Secondary | ICD-10-CM | POA: Diagnosis not present

## 2021-12-28 ENCOUNTER — Encounter: Payer: Self-pay | Admitting: Nurse Practitioner

## 2021-12-28 ENCOUNTER — Ambulatory Visit (INDEPENDENT_AMBULATORY_CARE_PROVIDER_SITE_OTHER): Payer: PPO | Admitting: Nurse Practitioner

## 2021-12-28 VITALS — BP 125/76 | HR 55 | Temp 97.5°F | Resp 20 | Ht <= 58 in | Wt 119.0 lb

## 2021-12-28 DIAGNOSIS — E559 Vitamin D deficiency, unspecified: Secondary | ICD-10-CM | POA: Diagnosis not present

## 2021-12-28 DIAGNOSIS — K219 Gastro-esophageal reflux disease without esophagitis: Secondary | ICD-10-CM | POA: Diagnosis not present

## 2021-12-28 DIAGNOSIS — M8588 Other specified disorders of bone density and structure, other site: Secondary | ICD-10-CM | POA: Diagnosis not present

## 2021-12-28 DIAGNOSIS — I10 Essential (primary) hypertension: Secondary | ICD-10-CM

## 2021-12-28 DIAGNOSIS — E785 Hyperlipidemia, unspecified: Secondary | ICD-10-CM

## 2021-12-28 MED ORDER — PANTOPRAZOLE SODIUM 40 MG PO TBEC: 40.0000 mg | DELAYED_RELEASE_TABLET | Freq: Every day | ORAL | 1 refills | Status: DC

## 2021-12-28 MED ORDER — METOPROLOL SUCCINATE ER 50 MG PO TB24: 50.0000 mg | ORAL_TABLET | Freq: Every day | ORAL | 1 refills | Status: DC

## 2021-12-28 MED ORDER — ATORVASTATIN CALCIUM 20 MG PO TABS: ORAL_TABLET | ORAL | 1 refills | Status: DC

## 2021-12-28 NOTE — Patient Instructions (Signed)

## 2021-12-28 NOTE — Progress Notes (Signed)
Subjective:    Patient ID: [name], female    DOB: 03/06/1950, 71 y.o.   MRN: [id number]   Chief Complaint: medical management of chronic issues     HPI:  [name] is a 71 y.o. who identifies as a female who was assigned female at birth.   Social history: Lives with: husband Work history: retired   Scientist, forensic in today for follow up of the following chronic medical issues:  1. Essential hypertension No c.o chest pain, sob or headache. Does not check blood pressure at home. BP Readings from Last 3 Encounters:  09/16/21 108/78  08/06/21 129/79  06/29/21 116/75     2. Hyperlipidemia with target LDL less than 100 Does try to watch diet and stays very active. Lab Results  Component Value Date   CHOL 169 06/29/2021   HDL 45 06/29/2021   LDLCALC 92 06/29/2021   TRIG 186 (H) 06/29/2021   CHOLHDL 3.8 06/29/2021      3. Gastroesophageal reflux disease without esophagitis Is on protonix daily and is working well.  4. Vitamin D deficiency Is on daily vitamin d supplement  5. Osteopenia of lumbar spine Last dexascan was done on 03/14/18- has done by GYN. Her last tscore was -2.4   New complaints: None today  Allergies  Allergen Reactions   Codeine Other (See Comments)    GI upset   Sulfonamide Derivatives     REACTION: severe GI upset   Outpatient Encounter Medications as of 12/28/2021  Medication Sig   ALPRAZolam (XANAX) 0.25 MG tablet Take 1 tablet (0.25 mg total) by mouth at bedtime as needed.   aspirin 81 MG chewable tablet Chew by mouth daily.   atorvastatin (LIPITOR) 20 MG tablet TAKE 1 TABLET BY MOUTH DAILY AT 6PM   BIOTIN PO Take by mouth.   CALCIUM-MAGNESIUM PO Take by mouth.   Cranberry 400 MG CAPS Take by mouth.   estradiol (ESTRACE VAGINAL) 0.1 MG/GM vaginal cream Place 1 Applicatorful vaginally 2 (two) times a week. Initial dose: Use nightly x 1 week, then decease to every other night x 1 week, then twice weekly (Patient not taking: Reported on  09/16/2021)   fluconazole (DIFLUCAN) 150 MG tablet Take 1 tablet (150 mg total) by mouth every 3 (three) days.   LUMIGAN 0.01 % SOLN    metoprolol succinate (TOPROL-XL) 50 MG 24 hr tablet TAKE 1 TABLET BY MOUTH DAILY WITH FOOD   pantoprazole (PROTONIX) 40 MG tablet TAKE ONE (1) TABLET BY MOUTH EVERY DAY   pyridOXINE (VITAMIN B-6) 100 MG tablet Take 100 mg by mouth daily.   No facility-administered encounter medications on file as of 12/28/2021.    Past Surgical History:  Procedure Laterality Date   CARDIAC CATHETERIZATION     COLPOSCOPY     excision of mole  2007   GYNECOLOGIC CRYOSURGERY     LAPAROSCOPIC CHOLECYSTECTOMY     TUBAL LIGATION  1991    Family History  Problem Relation Age of Onset   Hypertension Mother    Heart disease Mother    Hyperlipidemia Mother    Glaucoma Mother    Hypertension Father    Cancer Father        LUNG   Hyperlipidemia Father    Diabetes Sister    Hypertension Sister    Hyperlipidemia Sister    Fibromyalgia Daughter    Asthma Daughter       Controlled substance contract: n/a     Review of Systems  Constitutional:  Negative for diaphoresis.  Eyes:  Negative for pain.  Respiratory:  Negative for shortness of breath.   Cardiovascular:  Negative for chest pain, palpitations and leg swelling.  Gastrointestinal:  Negative for abdominal pain.  Endocrine: Negative for polydipsia.  Skin:  Negative for rash.  Neurological:  Negative for dizziness, weakness and headaches.  Hematological:  Does not bruise/bleed easily.  All other systems reviewed and are negative.      Objective:   Physical Exam Vitals and nursing note reviewed.  Constitutional:      General: She is not in acute distress.    Appearance: Normal appearance. She is well-developed.  HENT:     Head: Normocephalic.     Right Ear: Tympanic membrane normal.     Left Ear: Tympanic membrane normal.     Nose: Nose normal.     Mouth/Throat:     Mouth: Mucous membranes are  moist.  Eyes:     Pupils: Pupils are equal, round, and reactive to light.  Neck:     Vascular: No carotid bruit or JVD.  Cardiovascular:     Rate and Rhythm: Normal rate and regular rhythm.     Heart sounds: Normal heart sounds.  Pulmonary:     Effort: Pulmonary effort is normal. No respiratory distress.     Breath sounds: Normal breath sounds. No wheezing or rales.  Chest:     Chest wall: No tenderness.  Abdominal:     General: Bowel sounds are normal. There is no distension or abdominal bruit.     Palpations: Abdomen is soft. There is no hepatomegaly, splenomegaly, mass or pulsatile mass.     Tenderness: There is no abdominal tenderness.  Musculoskeletal:        General: Normal range of motion.     Cervical back: Normal range of motion and neck supple.  Lymphadenopathy:     Cervical: No cervical adenopathy.  Skin:    General: Skin is warm and dry.  Neurological:     Mental Status: She is alert and oriented to person, place, and time.     Deep Tendon Reflexes: Reflexes are normal and symmetric.  Psychiatric:        Behavior: Behavior normal.        Thought Content: Thought content normal.        Judgment: Judgment normal.     BP 125/76   Pulse (!) 55   Temp (!) 97.5 F (36.4 C) (Temporal)   Resp 20   Ht _0  (1.473 m)   Wt 119 lb (54 kg)   SpO2 100%   BMI 24.87 kg/m            Assessment & Plan:  [name] comes in today with chief complaint of Medical Management of Chronic Issues (Frequent yeast infections)   Diagnosis and orders addressed:  1. Essential hypertension Low sodium diet - CBC with Differential/Platelet - CMP14+EGFR - metoprolol succinate (TOPROL-XL) 50 MG 24 hr tablet; Take 1 tablet (50 mg total) by mouth daily. with food  Dispense: 90 tablet; Refill: 1  2. Hyperlipidemia with target LDL less than 100 Low fat diet - Lipid panel - atorvastatin (LIPITOR) 20 MG tablet; TAKE 1 TABLET BY MOUTH DAILY AT 6PM  Dispense: 90 tablet; Refill:  1  3. Gastroesophageal reflux disease without esophagitis Avoid spicy foods Do not eat 2 hours prior to bedtime  - pantoprazole (PROTONIX) 40 MG tablet; Take 1 tablet (40 mg total) by mouth daily.  Dispense: 90 tablet;  Refill: 1  4. Vitamin D deficiency Continue daily vitamin d supplement  5. Osteopenia of lumbar spine Will call insurance- last dexascan insurance did not pay for.   Labs pending Health Maintenance reviewed Diet and exercise encouraged  Follow up plan: 6 months   Mary-Margaret Hassell Done, FNP

## 2021-12-29 ENCOUNTER — Ambulatory Visit
Admission: RE | Admit: 2021-12-29 | Discharge: 2021-12-29 | Disposition: A | Discharge status: Home / Self Care | Payer: PPO | Source: Ambulatory Visit | Attending: Nurse Practitioner | Admitting: Nurse Practitioner

## 2021-12-29 DIAGNOSIS — Z1231 Encounter for screening mammogram for malignant neoplasm of breast: Secondary | ICD-10-CM | POA: Diagnosis not present

## 2021-12-29 LAB — LIPID PANEL
Chol/HDL Ratio: 3.9 ratio (ref 0.0–4.4)
Cholesterol, Total: 162 mg/dL (ref 100–199)
HDL: 42 mg/dL (ref >39)
LDL Chol Calc (NIH): 86 mg/dL (ref 0–99)
Triglycerides: 202 mg/dL — ABNORMAL HIGH (ref 0–149)
VLDL Cholesterol Cal: 34 mg/dL (ref 5–40)

## 2021-12-29 LAB — CBC WITH DIFFERENTIAL/PLATELET
Basophils Absolute: 0 10*3/uL (ref 0.0–0.2)
Basos: 1 %
EOS (ABSOLUTE): 0.3 10*3/uL (ref 0.0–0.4)
Eos: 5 %
Hematocrit: 42.4 % (ref 34.0–46.6)
Hemoglobin: 14.1 g/dL (ref 11.1–15.9)
Immature Grans (Abs): 0 10*3/uL (ref 0.0–0.1)
Immature Granulocytes: 0 %
Lymphocytes Absolute: 2.3 10*3/uL (ref 0.7–3.1)
Lymphs: 36 %
MCH: 30.9 pg (ref 26.6–33.0)
MCHC: 33.3 g/dL (ref 31.5–35.7)
MCV: 93 fL (ref 79–97)
Monocytes Absolute: 0.5 10*3/uL (ref 0.1–0.9)
Monocytes: 8 %
Neutrophils Absolute: 3.3 10*3/uL (ref 1.4–7.0)
Neutrophils: 50 %
Platelets: 213 10*3/uL (ref 150–450)
RBC: 4.57 x10E6/uL (ref 3.77–5.28)
RDW: 12.5 % (ref 11.7–15.4)
WBC: 6.5 10*3/uL (ref 3.4–10.8)

## 2021-12-29 LAB — CMP14+EGFR
ALT: 19 IU/L (ref 0–32)
AST: 21 IU/L (ref 0–40)
Albumin/Globulin Ratio: 1.8 (ref 1.2–2.2)
Albumin: 4.2 g/dL (ref 3.8–4.8)
Alkaline Phosphatase: 97 IU/L (ref 44–121)
BUN/Creatinine Ratio: 15 (ref 12–28)
BUN: 12 mg/dL (ref 8–27)
Bilirubin Total: 0.4 mg/dL (ref 0.0–1.2)
CO2: 25 mmol/L (ref 20–29)
Calcium: 9.6 mg/dL (ref 8.7–10.3)
Chloride: 102 mmol/L (ref 96–106)
Creatinine, Ser: 0.82 mg/dL (ref 0.57–1.00)
Globulin, Total: 2.3 g/dL (ref 1.5–4.5)
Glucose: 90 mg/dL (ref 70–99)
Potassium: 4.3 mmol/L (ref 3.5–5.2)
Sodium: 141 mmol/L (ref 134–144)
Total Protein: 6.5 g/dL (ref 6.0–8.5)
eGFR: 76 mL/min/{1.73_m2} (ref >59)

## 2022-01-04 ENCOUNTER — Ambulatory Visit: Payer: PPO | Admitting: Nurse Practitioner

## 2022-01-13 ENCOUNTER — Telehealth (INDEPENDENT_AMBULATORY_CARE_PROVIDER_SITE_OTHER): Payer: PPO | Admitting: Nurse Practitioner

## 2022-01-13 ENCOUNTER — Encounter: Payer: Self-pay | Admitting: Nurse Practitioner

## 2022-01-13 DIAGNOSIS — R399 Unspecified symptoms and signs involving the genitourinary system: Secondary | ICD-10-CM

## 2022-01-13 MED ORDER — CEPHALEXIN 500 MG PO CAPS: 500.0000 mg | ORAL_CAPSULE | Freq: Two times a day (BID) | ORAL | 0 refills | Status: DC

## 2022-01-13 NOTE — Progress Notes (Signed)
Virtual Visit Consent   [name], you are scheduled for a virtual visit with Mary-Margaret Hassell Done, [place], a [facility] provider, today.     Just as with appointments in the office, your consent must be obtained to participate.  Your consent will be active for this visit and any virtual visit you may have with one of our providers in the next 365 days.     If you have a MyChart account, a copy of this consent can be sent to you electronically.  All virtual visits are billed to your insurance company just like a traditional visit in the office.    As this is a virtual visit, video technology does not allow for your provider to perform a traditional examination.  This may limit your provider's ability to fully assess your condition.  If your provider identifies any concerns that need to be evaluated in person or the need to arrange testing (such as labs, EKG, etc.), we will make arrangements to do so.     Although advances in technology are sophisticated, we cannot ensure that it will always work on either your end or our end.  If the connection with a video visit is poor, the visit may have to be switched to a telephone visit.  With either a video or telephone visit, we are not always able to ensure that we have a secure connection.     I need to obtain your verbal consent now.   Are you willing to proceed with your visit today? YES   [name] has provided verbal consent on 01/13/2022 for a virtual visit (video or telephone).   Mary-Margaret Hassell Done, FNP   Date: 01/13/2022 10:49 AM   Virtual Visit via Video Note   I, Mary-Margaret Hassell Done, connected with [name] ([id number], Feb 01, 1950) on 01/13/22 at 10:30 AM EST by a video-enabled telemedicine application and verified that I am speaking with the correct person using two identifiers.  Location: Patient: Virtual Visit Location Patient: Home Provider: Virtual Visit Location Provider: Mobile   I discussed the limitations of  evaluation and management by telemedicine and the availability of in person appointments. The patient expressed understanding and agreed to proceed.    History of Present Illness: [name] is a 72 y.o. who identifies as a female who was assigned female at birth, and is being seen today for uti.  HPI: Urinary Tract Infection  This is a new problem. The current episode started in the past 7 days. The problem occurs intermittently. The problem has been waxing and waning. The quality of the pain is described as burning. There has been no fever. The fever has been present for Less than 1 day. She is Sexually active. Associated symptoms include frequency, hesitancy and urgency. Pertinent negatives include no discharge or flank pain. Treatments tried: AZO.    Review of Systems  Genitourinary:  Positive for frequency, hesitancy and urgency. Negative for flank pain.    Problems:  Patient Active Problem List   Diagnosis Date Noted   Essential hypertension 03/28/2017   GERD (gastroesophageal reflux disease) 06/12/2012   Vitamin D deficiency 06/12/2012   Osteopenia    Hyperlipidemia with target LDL less than 100 08/05/2009   I B S-CONSTIPATION PREDOMINATE 01/31/2007   ESOPHAGEAL STRICTURE 01/27/2006   Diaphragmatic hernia 01/27/2006   HEMORRHOIDS, EXTERNAL 09/28/2003    Allergies:  Allergies  Allergen Reactions   Codeine Other (See Comments)    GI upset   Sulfonamide Derivatives  REACTION: severe GI upset   Medications:  Current Outpatient Medications:    ALPRAZolam (XANAX) 0.25 MG tablet, Take 1 tablet (0.25 mg total) by mouth at bedtime as needed., Disp: 30 tablet, Rfl: 5   aspirin 81 MG chewable tablet, Chew by mouth daily., Disp: , Rfl:    atorvastatin (LIPITOR) 20 MG tablet, TAKE 1 TABLET BY MOUTH DAILY AT 6PM, Disp: 90 tablet, Rfl: 1   BIOTIN PO, Take by mouth., Disp: , Rfl:    CALCIUM-MAGNESIUM PO, Take by mouth., Disp: , Rfl:    Cranberry 400 MG CAPS, Take by mouth., Disp:  , Rfl:    estradiol (ESTRACE VAGINAL) 0.1 MG/GM vaginal cream, Place 1 Applicatorful vaginally 2 (two) times a week. Initial dose: Use nightly x 1 week, then decease to every other night x 1 week, then twice weekly, Disp: 42.5 g, Rfl: 12   LUMIGAN 0.01 % SOLN, , Disp: , Rfl:    metoprolol succinate (TOPROL-XL) 50 MG 24 hr tablet, Take 1 tablet (50 mg total) by mouth daily. with food, Disp: 90 tablet, Rfl: 1   pantoprazole (PROTONIX) 40 MG tablet, Take 1 tablet (40 mg total) by mouth daily., Disp: 90 tablet, Rfl: 1   pyridOXINE (VITAMIN B-6) 100 MG tablet, Take 100 mg by mouth daily., Disp: , Rfl:   Observations/Objective: Patient is well-developed, well-nourished in no acute distress.  Resting comfortably  at home.  Head is normocephalic, atraumatic.  No labored breathing.  Speech is clear and coherent with logical content.  Patient is alert and oriented at baseline.  No back pain No abdominal pain  Assessment and Plan:  [name] in today with chief complaint of Urinary Tract Infection   1. UTI symptoms Take medication as prescribe Cotton underwear Take shower not bath Cranberry juice, yogurt Force fluids AZO over the counter X2 days RTO prn  Meds ordered this encounter  Medications   cephALEXin (KEFLEX) 500 MG capsule    Sig: Take 1 capsule (500 mg total) by mouth 2 (two) times daily.    Dispense:  14 capsule    Refill:  0    Order Specific Question:   Supervising Provider    Answer:   Caryl Pina A [[id number]]     Follow Up Instructions: I discussed the assessment and treatment plan with the patient. The patient was provided an opportunity to ask questions and all were answered. The patient agreed with the plan and demonstrated an understanding of the instructions.  A copy of instructions were sent to the patient via MyChart.  The patient was advised to call back or seek an in-person evaluation if the symptoms worsen or if the condition fails to improve as  anticipated.  Time:  I spent 6 minutes with the patient via telehealth technology discussing the above problems/concerns.    Mary-Margaret Hassell Done, FNP

## 2022-01-13 NOTE — Patient Instructions (Signed)
Take medication as prescribe Cotton underwear Take shower not bath Cranberry juice, yogurt Force fluids AZO over the counter X2 days RTO prn   

## 2022-02-14 ENCOUNTER — Ambulatory Visit (INDEPENDENT_AMBULATORY_CARE_PROVIDER_SITE_OTHER): Payer: PPO

## 2022-02-14 VITALS — Ht <= 58 in | Wt 120.0 lb

## 2022-02-14 DIAGNOSIS — Z780 Asymptomatic menopausal state: Secondary | ICD-10-CM | POA: Diagnosis not present

## 2022-02-14 DIAGNOSIS — Z0000 Encounter for general adult medical examination without abnormal findings: Secondary | ICD-10-CM | POA: Diagnosis not present

## 2022-02-14 NOTE — Patient Instructions (Signed)
Ms. [name] , Thank you for taking time to come for your Medicare Wellness Visit. I appreciate your ongoing commitment to your health goals. Please review the following plan we discussed and let me know if I can assist you in the future.   These are the goals we discussed:  Goals      DIET - EAT MORE FRUITS AND VEGETABLES     DIET - INCREASE WATER INTAKE     Try to drink 6-8 glasses of water daily     Increase physical activity     Pt was walking, plans ro start back        This is a list of the screening recommended for you and due dates:  Health Maintenance  Topic Date Due   DEXA scan (bone density measurement)  03/13/2020   COVID-19 Vaccine (4 - 2023-24 season) 09/03/2021   Mammogram  12/30/2022   Medicare Annual Wellness Visit  02/15/2023   Cologuard (Stool DNA test)  07/17/2023   DTaP/Tdap/Td vaccine (3 - Td or Tdap) 06/30/2031   Pneumonia Vaccine  Completed   Flu Shot  Completed   Hepatitis C Screening: USPSTF Recommendation to screen - Ages 18-79 yo.  Completed   Zoster (Shingles) Vaccine  Completed   HPV Vaccine  Aged Out   Colon Cancer Screening  Discontinued    Advanced directives: Advance directive discussed with you today. I have provided a copy for you to complete at home and have notarized. Once this is complete please bring a copy in to our office so we can scan it into your chart.   Conditions/risks identified: Aim for 30 minutes of exercise or brisk walking, 6-8 glasses of water, and 5 servings of fruits and vegetables each day.   Next appointment: Follow up in one year for your annual wellness visit    Preventive Care 65 Years and Older, Female Preventive care refers to lifestyle choices and visits with your health care provider that can promote health and wellness. What does preventive care include? A yearly physical exam. This is also called an annual well check. Dental exams once or twice a year. Routine eye exams. Ask your health care provider how often  you should have your eyes checked. Personal lifestyle choices, including: Daily care of your teeth and gums. Regular physical activity. Eating a healthy diet. Avoiding tobacco and drug use. Limiting alcohol use. Practicing safe sex. Taking low-dose aspirin every day. Taking vitamin and mineral supplements as recommended by your health care provider. What happens during an annual well check? The services and screenings done by your health care provider during your annual well check will depend on your age, overall health, lifestyle risk factors, and family history of disease. Counseling  Your health care provider may ask you questions about your: Alcohol use. Tobacco use. Drug use. Emotional well-being. Home and relationship well-being. Sexual activity. Eating habits. History of falls. Memory and ability to understand (cognition). Work and work Statistician. Reproductive health. Screening  You may have the following tests or measurements: Height, weight, and BMI. Blood pressure. Lipid and cholesterol levels. These may be checked every 5 years, or more frequently if you are over 40 years old. Skin check. Lung cancer screening. You may have this screening every year starting at age 85 if you have a 30-pack-year history of smoking and currently smoke or have quit within the past 15 years. Fecal occult blood test (FOBT) of the stool. You may have this test every year starting at age 45.  Flexible sigmoidoscopy or colonoscopy. You may have a sigmoidoscopy every 5 years or a colonoscopy every 10 years starting at age 8. Hepatitis C blood test. Hepatitis B blood test. Sexually transmitted disease (STD) testing. Diabetes screening. This is done by checking your blood sugar (glucose) after you have not eaten for a while (fasting). You may have this done every 1-3 years. Bone density scan. This is done to screen for osteoporosis. You may have this done starting at age 53. Mammogram. This  may be done every 1-2 years. Talk to your health care provider about how often you should have regular mammograms. Talk with your health care provider about your test results, treatment options, and if necessary, the need for more tests. Vaccines  Your health care provider may recommend certain vaccines, such as: Influenza vaccine. This is recommended every year. Tetanus, diphtheria, and acellular pertussis (Tdap, Td) vaccine. You may need a Td booster every 10 years. Zoster vaccine. You may need this after age 52. Pneumococcal 13-valent conjugate (PCV13) vaccine. One dose is recommended after age 35. Pneumococcal polysaccharide (PPSV23) vaccine. One dose is recommended after age 36. Talk to your health care provider about which screenings and vaccines you need and how often you need them. This information is not intended to replace advice given to you by your health care provider. Make sure you discuss any questions you have with your health care provider. Document Released: 01/16/2015 Document Revised: 09/09/2015 Document Reviewed: 10/21/2014 Elsevier Interactive Patient Education  2017 [place] Prevention in the Home Falls can cause injuries. They can happen to people of all ages. There are many things you can do to make your home safe and to help prevent falls. What can I do on the outside of my home? Regularly fix the edges of walkways and driveways and fix any cracks. Remove anything that might make you trip as you walk through a door, such as a raised step or threshold. Trim any bushes or trees on the path to your home. Use bright outdoor lighting. Clear any walking paths of anything that might make someone trip, such as rocks or tools. Regularly check to see if handrails are loose or broken. Make sure that both sides of any steps have handrails. Any raised decks and porches should have guardrails on the edges. Have any leaves, snow, or ice cleared regularly. Use sand or  salt on walking paths during winter. Clean up any spills in your garage right away. This includes oil or grease spills. What can I do in the bathroom? Use night lights. Install grab bars by the toilet and in the tub and shower. Do not use towel bars as grab bars. Use non-skid mats or decals in the tub or shower. If you need to sit down in the shower, use a plastic, non-slip stool. Keep the floor dry. Clean up any water that spills on the floor as soon as it happens. Remove soap buildup in the tub or shower regularly. Attach bath mats securely with double-sided non-slip rug tape. Do not have throw rugs and other things on the floor that can make you trip. What can I do in the bedroom? Use night lights. Make sure that you have a light by your bed that is easy to reach. Do not use any sheets or blankets that are too big for your bed. They should not hang down onto the floor. Have a firm chair that has side arms. You can use this for support while you get  dressed. Do not have throw rugs and other things on the floor that can make you trip. What can I do in the kitchen? Clean up any spills right away. Avoid walking on wet floors. Keep items that you use a lot in easy-to-reach places. If you need to reach something above you, use a strong step stool that has a grab bar. Keep electrical cords out of the way. Do not use floor polish or wax that makes floors slippery. If you must use wax, use non-skid floor wax. Do not have throw rugs and other things on the floor that can make you trip. What can I do with my stairs? Do not leave any items on the stairs. Make sure that there are handrails on both sides of the stairs and use them. Fix handrails that are broken or loose. Make sure that handrails are as long as the stairways. Check any carpeting to make sure that it is firmly attached to the stairs. Fix any carpet that is loose or worn. Avoid having throw rugs at the top or bottom of the stairs. If  you do have throw rugs, attach them to the floor with carpet tape. Make sure that you have a light switch at the top of the stairs and the bottom of the stairs. If you do not have them, ask someone to add them for you. What else can I do to help prevent falls? Wear shoes that: Do not have high heels. Have rubber bottoms. Are comfortable and fit you well. Are closed at the toe. Do not wear sandals. If you use a stepladder: Make sure that it is fully opened. Do not climb a closed stepladder. Make sure that both sides of the stepladder are locked into place. Ask someone to hold it for you, if possible. Clearly mark and make sure that you can see: Any grab bars or handrails. First and last steps. Where the edge of each step is. Use tools that help you move around (mobility aids) if they are needed. These include: Canes. Walkers. Scooters. Crutches. Turn on the lights when you go into a dark area. Replace any light bulbs as soon as they burn out. Set up your furniture so you have a clear path. Avoid moving your furniture around. If any of your floors are uneven, fix them. If there are any pets around you, be aware of where they are. Review your medicines with your doctor. Some medicines can make you feel dizzy. This can increase your chance of falling. Ask your doctor what other things that you can do to help prevent falls. This information is not intended to replace advice given to you by your health care provider. Make sure you discuss any questions you have with your health care provider. Document Released: 10/16/2008 Document Revised: 05/28/2015 Document Reviewed: 01/24/2014 Elsevier Interactive Patient Education  2017 [employer].

## 2022-02-14 NOTE — Progress Notes (Signed)
Subjective:   [name] is a 72 y.o. female who presents for Medicare Annual (Subsequent) preventive examination. I connected with  [name] on 02/14/22 by a audio enabled telemedicine application and verified that I am speaking with the correct person using two identifiers.  Patient Location: Home  Provider Location: Home Office  I discussed the limitations of evaluation and management by telemedicine. The patient expressed understanding and agreed to proceed.  Review of Systems     Cardiac Risk Factors include: advanced age (>34mn, >>55women);hypertension     Objective:    Today's Vitals   02/14/22 1345  Weight: 120 lb (54.4 kg)  Height: 4' 10"$  (1.473 m)   Body mass index is 25.08 kg/m.     02/14/2022    1:49 PM 11/23/2020   10:36 AM 11/21/2019   10:51 AM 11/20/2018   10:40 AM 11/15/2017   11:20 AM 10/19/2016    1:16 PM  Advanced Directives  Does Patient Have a Medical Advance Directive? No No No No No No  Would patient like information on creating a medical advance directive? No - Patient declined No - Patient declined No - Patient declined No - Patient declined No - Patient declined Yes (MAU/Ambulatory/Procedural Areas - Information given)    Current Medications (verified) Outpatient Encounter Medications as of 02/14/2022  Medication Sig   ALPRAZolam (XANAX) 0.25 MG tablet Take 1 tablet (0.25 mg total) by mouth at bedtime as needed.   aspirin 81 MG chewable tablet Chew by mouth daily.   atorvastatin (LIPITOR) 20 MG tablet TAKE 1 TABLET BY MOUTH DAILY AT 6PM   BIOTIN PO Take by mouth.   CALCIUM-MAGNESIUM PO Take by mouth.   Cranberry 400 MG CAPS Take by mouth.   estradiol (ESTRACE VAGINAL) 0.1 MG/GM vaginal cream Place 1 Applicatorful vaginally 2 (two) times a week. Initial dose: Use nightly x 1 week, then decease to every other night x 1 week, then twice weekly   LUMIGAN 0.01 % SOLN    metoprolol succinate (TOPROL-XL) 50 MG 24 hr tablet Take 1 tablet (50  mg total) by mouth daily. with food   pantoprazole (PROTONIX) 40 MG tablet Take 1 tablet (40 mg total) by mouth daily.   pyridOXINE (VITAMIN B-6) 100 MG tablet Take 100 mg by mouth daily.   cephALEXin (KEFLEX) 500 MG capsule Take 1 capsule (500 mg total) by mouth 2 (two) times daily. (Patient not taking: Reported on 02/14/2022)   No facility-administered encounter medications on file as of 02/14/2022.    Allergies (verified) Codeine and Sulfonamide derivatives   History: Past Medical History:  Diagnosis Date   Allergic rhinitis    Atypical mole 05/10/2005   mod-severe- right outer central back   Atypical mole 07/20/2011   moderate- back of right knee   Atypical mole 10/22/2013   mild- left anterior thigh sup   Cerumen impaction    right    Chest discomfort    Chronic constipation    Diverticulosis    GERD (gastroesophageal reflux disease)    Glaucoma    Hyperlipidemia    Kidney stones    Nasal congestion    Osteopenia 03/2018   T score -2.0 FRAX 12% / 2.2% stable from prior DEXA   Palpitation    PUD (peptic ulcer disease)    Scoliosis    Sinusitis    Vitamin D deficiency    Past Surgical History:  Procedure Laterality Date   CARDIAC CATHETERIZATION     COLPOSCOPY  excision of mole  2007   GYNECOLOGIC CRYOSURGERY     LAPAROSCOPIC CHOLECYSTECTOMY     TUBAL LIGATION  1991   Family History  Problem Relation Age of Onset   Hypertension Mother    Heart disease Mother    Hyperlipidemia Mother    Glaucoma Mother    Hypertension Father    Cancer Father        LUNG   Hyperlipidemia Father    Diabetes Sister    Hypertension Sister    Hyperlipidemia Sister    Fibromyalgia Daughter    Asthma Daughter    Social History   Socioeconomic History   Marital status: Married    Spouse name: Eddie   Number of children: 1   Years of education: 14   Highest education level: Associate degree: occupational, Hotel manager, or vocational program  Occupational History    Occupation: Retired    Fish farm manager: [employer]  Tobacco Use   Smoking status: Never   Smokeless tobacco: Never  Vaping Use   Vaping Use: Never used  Substance and Sexual Activity   Alcohol use: Yes    Alcohol/week: 0.0 standard drinks of alcohol    Comment: Rare   Drug use: No   Sexual activity: Yes    Birth control/protection: Post-menopausal, Surgical    Comment: Tubal lig-1st intercourse 72 yo-Fewer than 5 partners  Other Topics Concern   Not on file  Social History Narrative   Not on file   Social Determinants of Health   Financial Resource Strain: Low Risk  (02/14/2022)   Overall Financial Resource Strain (CARDIA)    Difficulty of Paying Living Expenses: Not hard at all  Food Insecurity: No Food Insecurity (02/14/2022)   Hunger Vital Sign    Worried About Running Out of Food in the Last Year: Never true    Ran Out of Food in the Last Year: Never true  Transportation Needs: No Transportation Needs (02/14/2022)   PRAPARE - Hydrologist (Medical): No    Lack of Transportation (Non-Medical): No  Physical Activity: Insufficiently Active (11/23/2020)   Exercise Vital Sign    Days of Exercise per Week: 7 days    Minutes of Exercise per Session: 10 min  Stress: No Stress Concern Present (02/14/2022)   [place]    Feeling of Stress : Not at all  Social Connections: [place] (02/14/2022)   Social Connection and Isolation Panel [NHANES]    Frequency of Communication with Friends and Family: More than three times a week    Frequency of Social Gatherings with Friends and Family: More than three times a week    Attends Religious Services: More than 4 times per year    Active Member of [employer] or Organizations: Yes    Attends Music therapist: More than 4 times per year    Marital Status: Married    Tobacco Counseling Counseling given: Not Answered   Clinical  Intake:  Pre-visit preparation completed: Yes  Pain : No/denies pain     Nutritional Risks: None Diabetes: No  How often do you need to have someone help you when you read instructions, pamphlets, or other written materials from your doctor or pharmacy?: 1 - Never  Diabetic?no   Interpreter Needed?: No  Information entered by :: Jadene Pierini, LPN   Activities of Daily Living    02/14/2022    1:49 PM  In your present state of health, do  you have any difficulty performing the following activities:  Hearing? 0  Vision? 0  Difficulty concentrating or making decisions? 0  Walking or climbing stairs? 0  Dressing or bathing? 0  Doing errands, shopping? 0  Preparing Food and eating ? N  Using the Toilet? N  In the past six months, have you accidently leaked urine? N  Do you have problems with loss of bowel control? N  Managing your Medications? N  Managing your Finances? N  Housekeeping or managing your Housekeeping? N    Patient Care Team: Chevis Pretty, FNP as PCP - General (Family Medicine) [facility] (Dermatology) Phineas Real, Belinda Block, MD (Inactive) as Consulting Physician (Gynecology) [facility] [place] (Diagnostic Radiology) Jola Schmidt, MD as Consulting Physician (Ophthalmology) Warren Danes, PA-C as Physician Assistant (Dermatology)  Indicate any recent Medical Services you may have received from other than [facility] providers in the past year (date may be approximate).     Assessment:   This is a routine wellness examination for Shelita.  Hearing/Vision screen Vision Screening - Comments:: Wears rx glasses - up to date with routine eye exams with  Dr.Bowen   Dietary issues and exercise activities discussed: Current Exercise Habits: The patient does not participate in regular exercise at present, Exercise limited by: None identified   Goals Addressed             This Visit's Progress    DIET - EAT MORE  FRUITS AND VEGETABLES   On track      Depression Screen    02/14/2022    1:48 PM 12/28/2021    9:43 AM 08/06/2021    9:23 AM 06/29/2021   12:05 PM 01/07/2021    9:46 AM 11/23/2020   10:35 AM 06/17/2020   12:02 PM  PHQ 2/9 Scores  PHQ - 2 Score 0 0 0 0 0 0 0  PHQ- 9 Score 0 0 1 1   2    $ Fall Risk    02/14/2022    1:47 PM 12/28/2021    9:42 AM 08/06/2021    9:23 AM 06/29/2021   12:05 PM 01/07/2021    9:46 AM  Fall Risk   Falls in the past year? 0 1 0 0 0  Number falls in past yr: 0 0     Injury with Fall? 0 0     Risk for fall due to : No Fall Risks History of fall(s)     Follow up Falls prevention discussed Education provided       [place]:  Any stairs in or around the home? No  If so, are there any without handrails? No  Home free of loose throw rugs in walkways, pet beds, electrical cords, etc? Yes  Adequate lighting in your home to reduce risk of falls? Yes   ASSISTIVE DEVICES UTILIZED TO PREVENT FALLS:  Life alert? No  Use of a cane, walker or w/c? No  Grab bars in the bathroom? No  Shower chair or bench in shower? No  Elevated toilet seat or a handicapped toilet? No       11/15/2017   11:23 AM 10/19/2016    1:12 PM  MMSE - Mini Mental State Exam  Orientation to time 5 5  Orientation to Place 5 5  Registration 3 3  Attention/ Calculation 5 5  Recall 3 3  Language- name 2 objects 2 2  Language- repeat 1 1  Language- follow 3 step  command 3 3  Language- read & follow direction 1 1  Write a sentence 1 1  Copy design 1 1  Total score 30 30        02/14/2022    1:50 PM 11/21/2019   10:46 AM 11/20/2018   10:45 AM  6CIT Screen  What Year? 0 points 0 points 0 points  What month? 0 points 0 points 0 points  What time? 0 points 0 points 0 points  Count back from 20 0 points 0 points 0 points  Months in reverse 0 points 0 points 0 points  Repeat phrase 0 points 0 points 0 points  Total Score 0 points 0 points 0 points     Immunizations Immunization History  Administered Date(s) Administered   Fluad Quad(high Dose 65+) 09/11/2018, 11/21/2019, 11/17/2020, 11/03/2021   Influenza, High Dose Seasonal PF 10/19/2016, 10/09/2017   Influenza,inj,Quad PF,6+ Mos 10/14/2015   Influenza-Unspecified 11/18/2014   Moderna Sars-Covid-2 Vaccination 02/13/2019, 03/12/2019, 10/16/2019   Pneumococcal Conjugate-13 07/11/2014   Pneumococcal Polysaccharide-23 07/21/2015   Td 06/29/2021   Tdap 05/14/2011   Zoster Recombinat (Shingrix) 06/17/2020, 08/13/2020   Zoster, Live 04/04/2011    TDAP status: Up to date  Flu Vaccine status: Up to date  Pneumococcal vaccine status: Up to date  Covid-19 vaccine status: Completed vaccines  Qualifies for Shingles Vaccine? Yes   Zostavax completed Yes   Shingrix Completed?: Yes  Screening Tests Health Maintenance  Topic Date Due   DEXA SCAN  03/13/2020   COVID-19 Vaccine (4 - 2023-24 season) 09/03/2021   MAMMOGRAM  12/30/2022   Medicare Annual Wellness (AWV)  02/15/2023   Fecal DNA (Cologuard)  07/17/2023   DTaP/Tdap/Td (3 - Td or Tdap) 06/30/2031   Pneumonia Vaccine 55+ Years old  Completed   INFLUENZA VACCINE  Completed   Hepatitis C Screening  Completed   Zoster Vaccines- Shingrix  Completed   HPV VACCINES  Aged Out   COLONOSCOPY (Pts 45-30yr Insurance coverage will need to be confirmed)  Discontinued    Health Maintenance  Health Maintenance Due  Topic Date Due   DEXA SCAN  03/13/2020   COVID-19 Vaccine (4 - 2023-24 season) 09/03/2021    Colorectal cancer screening: Type of screening: Cologuard. Completed 07/16/2020. Repeat every 3 years  Mammogram status: Completed 12/29/2021. Repeat every year  Bone Density status: Ordered 02/14/2022. Pt provided with contact info and advised to call to schedule appt.  Lung Cancer Screening: (Low Dose CT Chest recommended if Age 73-80years, 30 pack-year currently smoking OR have quit w/in 15years.) does not qualify.    Lung Cancer Screening Referral: n/a  Additional Screening:  Hepatitis C Screening: does qualify; Completed 01/20/2015  Vision Screening: Recommended annual ophthalmology exams for early detection of glaucoma and other disorders of the eye. Is the patient up to date with their annual eye exam?  Yes  Who is the provider or what is the name of the office in which the patient attends annual eye exams? Dr.Bowman  If pt is not established with a provider, would they like to be referred to a provider to establish care? No .   Dental Screening: Recommended annual dental exams for proper oral hygiene  Community Resource Referral / Chronic Care Management: CRR required this visit?  No   CCM required this visit?  No      Plan:     I have personally reviewed and noted the following in the patient's chart:   Medical and social history Use of alcohol, tobacco  or illicit drugs  Current medications and supplements including opioid prescriptions. Patient is not currently taking opioid prescriptions. Functional ability and status Nutritional status Physical activity Advanced directives List of other physicians Hospitalizations, surgeries, and ER visits in previous 12 months Vitals Screenings to include cognitive, depression, and falls Referrals and appointments  In addition, I have reviewed and discussed with patient certain preventive protocols, quality metrics, and best practice recommendations. A written personalized care plan for preventive services as well as general preventive health recommendations were provided to patient.     Daphane Shepherd, LPN   [id number]   Nurse Notes: none

## 2022-02-24 ENCOUNTER — Ambulatory Visit (INDEPENDENT_AMBULATORY_CARE_PROVIDER_SITE_OTHER): Payer: PPO | Admitting: Nurse Practitioner

## 2022-02-24 ENCOUNTER — Encounter: Payer: Self-pay | Admitting: Nurse Practitioner

## 2022-02-24 VITALS — BP 122/71 | HR 65 | Temp 96.6°F | Resp 20 | Ht <= 58 in | Wt 119.0 lb

## 2022-02-24 DIAGNOSIS — B3731 Acute candidiasis of vulva and vagina: Secondary | ICD-10-CM | POA: Diagnosis not present

## 2022-02-24 MED ORDER — FLUCONAZOLE 150 MG PO TABS: ORAL_TABLET | ORAL | 0 refills | Status: DC

## 2022-02-24 NOTE — Patient Instructions (Signed)

## 2022-02-24 NOTE — Progress Notes (Signed)
Subjective:    Patient ID: [name], female    DOB: [birth year], 72 y.o.   MRN: [id number]   Chief Complaint: Recurring yeast infections   HPI Patient comes in c/o recurrent yeast infections. Se used OTC cream and that seemed to help. Blood sugars ave been good. Currently no discharge or itching Patient Active Problem List   Diagnosis Date Noted   Essential hypertension 03/28/2017   GERD (gastroesophageal reflux disease) 06/12/2012   Vitamin D deficiency 06/12/2012   Osteopenia    Hyperlipidemia with target LDL less than 100 08/05/2009   I B S-CONSTIPATION PREDOMINATE 01/31/2007   ESOPHAGEAL STRICTURE 01/27/2006   Diaphragmatic hernia 01/27/2006   HEMORRHOIDS, EXTERNAL 09/28/2003       Review of Systems  Constitutional:  Negative for diaphoresis.  Eyes:  Negative for pain.  Respiratory:  Negative for shortness of breath.   Cardiovascular:  Negative for chest pain, palpitations and leg swelling.  Gastrointestinal:  Negative for abdominal pain.  Endocrine: Negative for polydipsia.  Skin:  Negative for rash.  Neurological:  Negative for dizziness, weakness and headaches.  Hematological:  Does not bruise/bleed easily.  All other systems reviewed and are negative.      Objective:   Physical Exam Vitals and nursing note reviewed.  Constitutional:      General: She is not in acute distress.    Appearance: Normal appearance. She is well-developed.  Neck:     Vascular: No carotid bruit or JVD.  Cardiovascular:     Rate and Rhythm: Normal rate and regular rhythm.     Heart sounds: Normal heart sounds.  Pulmonary:     Effort: Pulmonary effort is normal. No respiratory distress.     Breath sounds: Normal breath sounds. No wheezing or rales.  Chest:     Chest wall: No tenderness.  Abdominal:     General: Bowel sounds are normal. There is no distension or abdominal bruit.     Palpations: Abdomen is soft. There is no hepatomegaly, splenomegaly, mass or pulsatile mass.      Tenderness: There is no abdominal tenderness.  Musculoskeletal:        General: Normal range of motion.     Cervical back: Normal range of motion and neck supple.  Lymphadenopathy:     Cervical: No cervical adenopathy.  Skin:    General: Skin is warm and dry.  Neurological:     Mental Status: She is alert and oriented to person, place, and time.     Deep Tendon Reflexes: Reflexes are normal and symmetric.  Psychiatric:        Behavior: Behavior normal.        Thought Content: Thought content normal.        Judgment: Judgment normal.    BP 122/71   Pulse 65   Temp (!) 96.6 F (35.9 C) (Temporal)   Resp 20   Ht 4' 10"$  (1.473 m)   Wt 119 lb (54 kg)   SpO2 99%   BMI 24.87 kg/m         Assessment & Plan:  [name] in today with chief complaint of Recurring yeast infections   1. Vaginal candidiasis Avoid bubble baths Void after intercourse - fluconazole (DIFLUCAN) 150 MG tablet; 1 po q week x 4 weeks  Dispense: 4 tablet; Refill: 0    The above assessment and management plan was discussed with the patient. The patient verbalized understanding of and has agreed to the management plan. Patient is aware  to call the clinic if symptoms persist or worsen. Patient is aware when to return to the clinic for a follow-up visit. Patient educated on when it is appropriate to go to the emergency department.   Mary-Margaret Hassell Done, FNP

## 2022-04-18 ENCOUNTER — Other Ambulatory Visit: Payer: Self-pay

## 2022-04-18 ENCOUNTER — Emergency Department (HOSPITAL_COMMUNITY): Payer: PPO

## 2022-04-18 ENCOUNTER — Encounter (HOSPITAL_COMMUNITY): Payer: Self-pay | Admitting: Emergency Medicine

## 2022-04-18 ENCOUNTER — Emergency Department (HOSPITAL_COMMUNITY)
Admission: EM | Admit: 2022-04-18 | Discharge: 2022-04-18 | Disposition: A | Discharge status: Home / Self Care | Payer: PPO | Attending: Emergency Medicine | Admitting: Emergency Medicine

## 2022-04-18 DIAGNOSIS — Z7982 Long term (current) use of aspirin: Secondary | ICD-10-CM | POA: Diagnosis not present

## 2022-04-18 DIAGNOSIS — R0789 Other chest pain: Secondary | ICD-10-CM | POA: Diagnosis not present

## 2022-04-18 DIAGNOSIS — R531 Weakness: Secondary | ICD-10-CM | POA: Diagnosis not present

## 2022-04-18 DIAGNOSIS — Z79899 Other long term (current) drug therapy: Secondary | ICD-10-CM | POA: Insufficient documentation

## 2022-04-18 DIAGNOSIS — R079 Chest pain, unspecified: Secondary | ICD-10-CM | POA: Diagnosis not present

## 2022-04-18 LAB — CBC
HCT: 42.8 % (ref 36.0–46.0)
Hemoglobin: 13.9 g/dL (ref 12.0–15.0)
MCH: 30.5 pg (ref 26.0–34.0)
MCHC: 32.5 g/dL (ref 30.0–36.0)
MCV: 93.9 fL (ref 80.0–100.0)
Platelets: 199 10*3/uL (ref 150–400)
RBC: 4.56 MIL/uL (ref 3.87–5.11)
RDW: 13.2 % (ref 11.5–15.5)
WBC: 6.2 10*3/uL (ref 4.0–10.5)
nRBC: 0 % (ref 0.0–0.2)

## 2022-04-18 LAB — BASIC METABOLIC PANEL
Anion gap: 7 (ref 5–15)
BUN: 18 mg/dL (ref 8–23)
CO2: 23 mmol/L (ref 22–32)
Calcium: 9.2 mg/dL (ref 8.9–10.3)
Chloride: 108 mmol/L (ref 98–111)
Creatinine, Ser: 0.72 mg/dL (ref 0.44–1.00)
GFR, Estimated: >60 mL/min (ref >60)
Glucose, Bld: 102 mg/dL — ABNORMAL HIGH (ref 70–99)
Potassium: 3.5 mmol/L (ref 3.5–5.1)
Sodium: 138 mmol/L (ref 135–145)

## 2022-04-18 LAB — TROPONIN I (HIGH SENSITIVITY)
Troponin I (High Sensitivity): 2 ng/L (ref <18)
Troponin I (High Sensitivity): 3 ng/L (ref <18)

## 2022-04-18 NOTE — Discharge Instructions (Signed)
As discussed, your evaluation today has been largely reassuring.  But, it is important that you monitor your condition carefully, and do not hesitate to return to the ED if you develop new, or concerning changes in your condition.  Please obtain and use diclofenac cream, available over-the-counter for pain control.  Otherwise, please follow-up with your physician for appropriate ongoing care.

## 2022-04-18 NOTE — ED Provider Notes (Signed)
[place] EMERGENCY DEPARTMENT AT [facility] Provider Note   CSN: [id number] Arrival date & time: 04/18/22  1941     History  Chief Complaint  Patient presents with   Chest Pain   Weakness    [name] is a 72 y.o. female.  HPI Presents with concern of intermittent left-sided chest tightness.  Symptoms are always in the same location, left upper chest wall.  That began 3 days ago, have been sporadic since that time, though slightly more severe today.  She notes that at the onset of symptoms she was feeling a heavy pot of water.  Husband corroborates this. No dyspnea, no nausea, vomiting, other complaints.     Home Medications Prior to Admission medications   Medication Sig Start Date End Date Taking? Authorizing Provider  ALPRAZolam (XANAX) 0.25 MG tablet Take 1 tablet (0.25 mg total) by mouth at bedtime as needed. 09/11/19   Bennie Pierini, FNP  aspirin 81 MG chewable tablet Chew by mouth daily.    [provider]  atorvastatin (LIPITOR) 20 MG tablet TAKE 1 TABLET BY MOUTH DAILY AT [facility] 12/28/21   Daphine Deutscher, Mary-Margaret, FNP  BIOTIN PO Take by mouth.    [provider]  CALCIUM-MAGNESIUM PO Take by mouth.    [provider]  Cranberry 400 MG CAPS Take by mouth.    [provider]  estradiol (ESTRACE VAGINAL) 0.1 MG/GM vaginal cream Place 1 Applicatorful vaginally 2 (two) times a week. Initial dose: Use nightly x 1 week, then decease to every other night x 1 week, then twice weekly Patient not taking: Reported on 02/24/2022 12/03/20   Wyline Beady A, NP  fluconazole (DIFLUCAN) 150 MG tablet 1 po q week x 4 weeks 02/24/22   Bennie Pierini, FNP  LUMIGAN 0.01 % SOLN  01/12/16   [provider]  metoprolol succinate (TOPROL-XL) 50 MG 24 hr tablet Take 1 tablet (50 mg total) by mouth daily. with food 12/28/21   Daphine Deutscher, Mary-Margaret, FNP  pantoprazole (PROTONIX) 40 MG tablet Take 1 tablet (40 mg total) by mouth  daily. 12/28/21   Daphine Deutscher Mary-Margaret, FNP  pyridOXINE (VITAMIN B-6) 100 MG tablet Take 100 mg by mouth daily.    [provider]      Allergies    Codeine and Sulfonamide derivatives    Review of Systems   Review of Systems  All other systems reviewed and are negative.   Physical Exam Updated Vital Signs BP (!) 156/78 (BP Location: Right Arm)   Pulse 67   Temp 98 F (36.7 C) (Oral)   Resp 18   SpO2 98%  Physical Exam Vitals and nursing note reviewed.  Constitutional:      General: She is not in acute distress.    Appearance: She is well-developed.  HENT:     Head: Normocephalic and atraumatic.  Eyes:     Conjunctiva/sclera: Conjunctivae normal.  Cardiovascular:     Rate and Rhythm: Normal rate and regular rhythm.  Pulmonary:     Effort: Pulmonary effort is normal. No respiratory distress.     Breath sounds: Normal breath sounds. No stridor.  Abdominal:     General: There is no distension.  Skin:    General: Skin is warm and dry.  Neurological:     Mental Status: She is alert and oriented to person, place, and time.     Cranial Nerves: No cranial nerve deficit.  Psychiatric:        Mood and Affect: Mood  normal.     ED Results / Procedures / Treatments   Labs (all labs ordered are listed, but only abnormal results are displayed) Labs Reviewed  BASIC METABOLIC PANEL - Abnormal; Notable for the following components:      Result Value   Glucose, Bld 102 (*)    All other components within normal limits  CBC  TROPONIN I (HIGH SENSITIVITY)  TROPONIN I (HIGH SENSITIVITY)    EKG EKG Interpretation  Date/Time:  Monday April 18 2022 09:50:14 EDT Ventricular Rate:  62 PR Interval:  196 QRS Duration: 74 QT Interval:  382 QTC Calculation: 387 R Axis:   8 Text Interpretation: Normal sinus rhythm Cannot rule out Anterior infarct , age undetermined Abnormal ECG Confirmed by Gerhard Munch [phone]) on 04/18/2022 9:52:49 AM  Radiology DG Chest 2  View  Result Date: 04/18/2022 CLINICAL DATA:  Chest pain. EXAM: CHEST - 2 VIEW COMPARISON:  None Available. FINDINGS: The heart size and mediastinal contours are within normal limits. Mild elevation of the right hemidiaphragm, unchanged. No focal consolidation or pleural effusion. Thoracic spondylosis. No acute osseous abnormality. IMPRESSION: No active cardiopulmonary disease. Electronically Signed   By: Larose Hires D.O.   On: 04/18/2022 10:04    Procedures Procedures    Medications Ordered in ED Medications - No data to display  ED Course/ Medical Decision Making/ A&P                             Medical Decision Making Adult female, generally well with history of hypertension presents with chest pain.  Given the onset following lifting an object there is some suspicion for musculoskeletal etiology, but ACS, PE, pneumonia are all considerations.  Cardiac 70 sinus normal Pulse ox 100% room air normal   Amount and/or Complexity of Data Reviewed Independent Historian: spouse External Data Reviewed: notes. Labs: ordered. Decision-making details documented in ED Course. Radiology: ordered and independent interpretation performed. Decision-making details documented in ED Course. ECG/medicine tests: ordered and independent interpretation performed. Decision-making details documented in ED Course.  Risk Prescription drug management. Decision regarding hospitalization.   1:38 PM Patient in no distress, no ongoing complaints.  I reviewed her findings discussed them with her and her husband.  No evidence for ACS, pneumonia, PE or other acute phenomena.  Suspicion for musculoskeletal etiology given her description of pain following lifting heavy object, reassuring findings here, patient, husband, comfortable with outpatient follow-up.        Final Clinical Impression(s) / ED Diagnoses Final diagnoses:  Atypical chest pain    Rx / DC Orders ED Discharge Orders     None          Gerhard Munch, MD 04/18/22 1338

## 2022-04-18 NOTE — ED Provider Triage Note (Signed)
Emergency Medicine Provider Triage Evaluation Note  [name] , a 72 y.o. female  was evaluated in triage.  Pt complains of intermittent, brief episodes of sharp stabbing-like chest pain left upper chest near the axilla.  Pain began sometime over the weekend.  Patient spouse states that she picked up a 24 pack of bottled water on Friday unsure if she may have injured herself doing this.  She also notes having some pain of her left upper arm, but pain does not radiate into her lower arm, neck or jaw.  She denies any shortness of breath or diaphoresis.  Review of Systems  Positive: Chest pain Negative: Shortness of breath, neck or jaw pain diaphoresis  Physical Exam  BP (!) 156/78 (BP Location: Right Arm)   Pulse 67   Temp 98 F (36.7 C) (Oral)   Resp 18   SpO2 98%  Gen:   Awake, no distress   Resp:  Normal effort  MSK:   Moves extremities without difficulty  Other:    Medical Decision Making  Medically screening exam initiated at 10:04 AM.  Appropriate orders placed.  AIDE DAPONTE was informed that the remainder of the evaluation will be completed by another provider, this initial triage assessment does not replace that evaluation, and the importance of remaining in the ED until their evaluation is complete.     Pauline Aus, PA-C 04/18/22 1007

## 2022-04-18 NOTE — ED Triage Notes (Signed)
Pt c/o quick sharp pains to upper left chest intermittent since yesterday. C/o gen weakness. Denies sob/sweating/n/v. Pt a/o. No obvious ss of pain noted. Color wnl and non diaphoretic. No obvious gen weakness noted, pt ambulatory with steady gait. States pain does run down left arm some today.

## 2022-04-22 ENCOUNTER — Telehealth: Payer: Self-pay

## 2022-04-22 NOTE — Telephone Encounter (Signed)
     Patient  visit on 4/15  at [facility] Have you been able to follow up with your primary care physician? No    The patient was or was not able to obtain any needed medicine or equipment. No   Are there diet recommendations that you are having difficulty following? Na   Patient expresses understanding of discharge instructions and education provided has no other needs at this time.  Yes      Lenard Forth [facility] Guide, [facility] [phone] [address] [place], [place], [place] [address] Phone: [phone] Email: [email or web address]

## 2022-04-27 ENCOUNTER — Ambulatory Visit (INDEPENDENT_AMBULATORY_CARE_PROVIDER_SITE_OTHER): Payer: PPO | Admitting: Family Medicine

## 2022-04-27 ENCOUNTER — Encounter: Payer: Self-pay | Admitting: Family Medicine

## 2022-04-27 VITALS — BP 134/79 | HR 71 | Temp 97.8°F | Ht <= 58 in | Wt 116.6 lb

## 2022-04-27 DIAGNOSIS — R300 Dysuria: Secondary | ICD-10-CM

## 2022-04-27 LAB — URINALYSIS, COMPLETE
Bilirubin, UA: NEGATIVE
Ketones, UA: NEGATIVE
Leukocytes,UA: NEGATIVE
Nitrite, UA: POSITIVE — AB
Specific Gravity, UA: 1.02 (ref 1.005–1.030)
Urobilinogen, Ur: 0.2 mg/dL (ref 0.2–1.0)
pH, UA: 6 (ref 5.0–7.5)

## 2022-04-27 LAB — MICROSCOPIC EXAMINATION: Renal Epithel, UA: NONE SEEN /hpf

## 2022-04-27 MED ORDER — AMOXICILLIN 500 MG PO CAPS: 500.0000 mg | ORAL_CAPSULE | Freq: Three times a day (TID) | ORAL | 0 refills | Status: DC

## 2022-04-27 NOTE — Progress Notes (Signed)
Subjective:  Patient ID: [name], female    DOB: 1950/10/31  Age: 72 y.o. MRN: [id number]  CC: Urinary Tract Infection   HPI [name] presents for burning with urination and frequency for several days. Denies fever . No flank pain. No nausea, vomiting.        04/27/2022    2:06 PM 04/27/2022    1:58 PM 02/24/2022   12:21 PM  Depression screen PHQ 2/9  Decreased Interest 0 0 0  Down, Depressed, Hopeless 0 0 0  PHQ - 2 Score 0 0 0  Altered sleeping   1  Tired, decreased energy   1  Change in appetite   0  Feeling bad or failure about yourself    0  Trouble concentrating   0  Moving slowly or fidgety/restless   0  Suicidal thoughts   0  PHQ-9 Score   2  Difficult doing work/chores   Not difficult at all    History [name] has a past medical history of Allergic rhinitis, Atypical mole (05/10/2005), Atypical mole (07/20/2011), Atypical mole (10/22/2013), Cerumen impaction, Chest discomfort, Chronic constipation, Diverticulosis, GERD (gastroesophageal reflux disease), Glaucoma, Hyperlipidemia, Kidney stones, Nasal congestion, Osteopenia (03/2018), Palpitation, PUD (peptic ulcer disease), Scoliosis, Sinusitis, and Vitamin D deficiency.   She has a past surgical history that includes Laparoscopic cholecystectomy; Tubal ligation (1991); Cardiac catheterization; excision of mole (2007); Gynecologic cryosurgery; and Colposcopy.   Her family history includes Asthma in her daughter; Cancer in her father; Diabetes in her sister; Fibromyalgia in her daughter; Glaucoma in her mother; Heart disease in her mother; Hyperlipidemia in her father, mother, and sister; Hypertension in her father, mother, and sister.She reports that she has never smoked. She has never used smokeless tobacco. She reports current alcohol use. She reports that she does not use drugs.    ROS Review of Systems  Constitutional:  Negative for chills, diaphoresis and fever.  HENT:  Negative for congestion.   Eyes:   Negative for visual disturbance.  Respiratory:  Negative for cough and shortness of breath.   Cardiovascular:  Negative for chest pain and palpitations.  Gastrointestinal:  Negative for constipation, diarrhea and nausea.  Genitourinary:  Positive for dysuria, frequency and urgency. Negative for decreased urine volume, flank pain, hematuria, menstrual problem and pelvic pain.  Musculoskeletal:  Negative for arthralgias and joint swelling.  Skin:  Negative for rash.  Neurological:  Negative for dizziness and numbness.    Objective:  BP 134/79   Pulse 71   Temp 97.8 F (36.6 C)   Ht  (1.473 m)   Wt 116 lb 9.6 oz (52.9 kg)   SpO2 97%   BMI 24.37 kg/m   BP Readings from Last 3 Encounters:  04/27/22 134/79  04/18/22 (!) 156/78  02/24/22 122/71    Wt Readings from Last 3 Encounters:  04/27/22 116 lb 9.6 oz (52.9 kg)  02/24/22 119 lb (54 kg)  02/14/22 120 lb (54.4 kg)     Physical Exam Constitutional:      Appearance: She is well-developed.  HENT:     Head: Normocephalic and atraumatic.  Cardiovascular:     Rate and Rhythm: Normal rate and regular rhythm.     Heart sounds: No murmur heard. Pulmonary:     Effort: Pulmonary effort is normal.     Breath sounds: Normal breath sounds.  Abdominal:     General: Bowel sounds are normal.     Palpations: Abdomen is soft. There is no mass.  Tenderness: There is no abdominal tenderness. There is no guarding or rebound.  Musculoskeletal:        General: No tenderness.  Skin:    General: Skin is warm and dry.  Neurological:     Mental Status: She is alert and oriented to person, place, and time.  Psychiatric:        Behavior: Behavior normal.       Assessment & Plan:   Brilynn was seen today for urinary tract infection.  Diagnoses and all orders for this visit:  Dysuria -     Urinalysis, Complete -     Urine Culture  Other orders -     amoxicillin (AMOXIL) 500 MG capsule; Take 1 capsule (500 mg total) by  mouth 3 (three) times daily.       I have discontinued Maripaz R. Flavell's estradiol and fluconazole. I am also having her start on amoxicillin. Additionally, I am having her maintain her CALCIUM-MAGNESIUM PO, BIOTIN PO, Cranberry, aspirin, Lumigan, pyridOXINE, ALPRAZolam, metoprolol succinate, pantoprazole, and atorvastatin.  Allergies as of 04/27/2022       Reactions   Codeine Other (See Comments)   GI upset   Sulfonamide Derivatives    REACTION: severe GI upset        Medication List        Accurate as of April 27, 2022  2:24 PM. If you have any questions, ask your nurse or doctor.          STOP taking these medications    estradiol 0.1 MG/GM vaginal cream Commonly known as: ESTRACE VAGINAL Stopped by: Mechele Claude, MD   fluconazole 150 MG tablet Commonly known as: Diflucan Stopped by: Mechele Claude, MD       TAKE these medications    ALPRAZolam 0.25 MG tablet Commonly known as: XANAX Take 1 tablet (0.25 mg total) by mouth at bedtime as needed.   amoxicillin 500 MG capsule Commonly known as: AMOXIL Take 1 capsule (500 mg total) by mouth 3 (three) times daily. Started by: Mechele Claude, MD   aspirin 81 MG chewable tablet Chew by mouth daily.   atorvastatin 20 MG tablet Commonly known as: LIPITOR TAKE 1 TABLET BY MOUTH DAILY AT 6PM   BIOTIN PO Take by mouth.   CALCIUM-MAGNESIUM PO Take by mouth.   Cranberry 400 MG Caps Take by mouth.   Lumigan 0.01 % Soln Generic drug: bimatoprost   metoprolol succinate 50 MG 24 hr tablet Commonly known as: TOPROL-XL Take 1 tablet (50 mg total) by mouth daily. with food   pantoprazole 40 MG tablet Commonly known as: PROTONIX Take 1 tablet (40 mg total) by mouth daily.   pyridOXINE 100 MG tablet Commonly known as: VITAMIN B6 Take 100 mg by mouth daily.         Follow-up: Return if symptoms worsen or fail to improve.  Mechele Claude, M.D.

## 2022-04-28 ENCOUNTER — Ambulatory Visit: Payer: PPO | Admitting: Family Medicine

## 2022-04-29 LAB — URINE CULTURE: Organism ID, Bacteria: NO GROWTH

## 2022-05-26 ENCOUNTER — Telehealth (INDEPENDENT_AMBULATORY_CARE_PROVIDER_SITE_OTHER): Payer: PPO | Admitting: Family Medicine

## 2022-05-26 ENCOUNTER — Encounter: Payer: Self-pay | Admitting: Family Medicine

## 2022-05-26 DIAGNOSIS — N3001 Acute cystitis with hematuria: Secondary | ICD-10-CM | POA: Diagnosis not present

## 2022-05-26 DIAGNOSIS — H40023 Open angle with borderline findings, high risk, bilateral: Secondary | ICD-10-CM | POA: Diagnosis not present

## 2022-05-26 DIAGNOSIS — R300 Dysuria: Secondary | ICD-10-CM | POA: Diagnosis not present

## 2022-05-26 LAB — URINALYSIS, ROUTINE W REFLEX MICROSCOPIC
Bilirubin, UA: NEGATIVE
Glucose, UA: NEGATIVE
Ketones, UA: NEGATIVE
Leukocytes,UA: NEGATIVE
Nitrite, UA: NEGATIVE
Specific Gravity, UA: >1.03 — ABNORMAL HIGH (ref 1.005–1.030)
Urobilinogen, Ur: 0.2 mg/dL (ref 0.2–1.0)
pH, UA: 5 (ref 5.0–7.5)

## 2022-05-26 LAB — MICROSCOPIC EXAMINATION: Renal Epithel, UA: NONE SEEN /hpf

## 2022-05-26 MED ORDER — CEPHALEXIN 500 MG PO CAPS
500.0000 mg | ORAL_CAPSULE | Freq: Two times a day (BID) | ORAL | 0 refills | Status: AC
Start: 2022-05-26 — End: 2022-06-02

## 2022-05-26 NOTE — Progress Notes (Signed)
Virtual Visit via Video   I connected with patient on 05/26/22 at 1245 by a video enabled telemedicine application and verified that I am speaking with the correct person using two identifiers.  Location patient: Home Location provider: [facility] Persons participating in the virtual visit: Patient and Provider  I discussed the limitations of evaluation and management by telemedicine and the availability of in person appointments. The patient expressed understanding and agreed to proceed.  Subjective:   HPI:  Pt presents today for  Chief Complaint  Patient presents with   Dysuria   Pt reports dysuria that started yesterday. States she has lower abdominal pressure, frequency, and urgency. She was recently treated for same with Amoxil. States symptoms improved but did not fully resolve.   Dysuria  This is a new problem. The current episode started yesterday. The problem occurs every urination. The problem has been gradually worsening. The quality of the pain is described as aching and burning. The pain is mild. There has been no fever. There is No history of pyelonephritis. Associated symptoms include frequency and urgency. Pertinent negatives include no chills, discharge, flank pain, hematuria, hesitancy, nausea, possible pregnancy, sweats or vomiting. She has tried increased fluids for the symptoms. The treatment provided no relief.     Review of Systems  Constitutional:  Negative for chills, diaphoresis, fever, malaise/fatigue and weight loss.  Gastrointestinal:  Positive for abdominal pain. Negative for blood in stool, constipation, diarrhea, heartburn, melena, nausea and vomiting.  Genitourinary:  Positive for dysuria, frequency and urgency. Negative for flank pain, hematuria and hesitancy.  Neurological:  Negative for weakness.  All other systems reviewed and are negative.    Patient Active Problem List   Diagnosis Date Noted   Essential  hypertension 03/28/2017   GERD (gastroesophageal reflux disease) 06/12/2012   Vitamin D deficiency 06/12/2012   Osteopenia    Hyperlipidemia with target LDL less than 100 08/05/2009   I B S-CONSTIPATION PREDOMINATE 01/31/2007   ESOPHAGEAL STRICTURE 01/27/2006   Diaphragmatic hernia 01/27/2006   HEMORRHOIDS, EXTERNAL 09/28/2003    Social History   Tobacco Use   Smoking status: Never   Smokeless tobacco: Never  Substance Use Topics   Alcohol use: Yes    Alcohol/week: 0.0 standard drinks of alcohol    Comment: Rare    Current Outpatient Medications:    cephALEXin (KEFLEX) 500 MG capsule, Take 1 capsule (500 mg total) by mouth 2 (two) times daily for 7 days., Disp: 14 capsule, Rfl: 0   ALPRAZolam (XANAX) 0.25 MG tablet, Take 1 tablet (0.25 mg total) by mouth at bedtime as needed., Disp: 30 tablet, Rfl: 5   aspirin 81 MG chewable tablet, Chew by mouth daily., Disp: , Rfl:    atorvastatin (LIPITOR) 20 MG tablet, TAKE 1 TABLET BY MOUTH DAILY AT 6PM, Disp: 90 tablet, Rfl: 1   BIOTIN PO, Take by mouth., Disp: , Rfl:    CALCIUM-MAGNESIUM PO, Take by mouth., Disp: , Rfl:    Cranberry 400 MG CAPS, Take by mouth., Disp: , Rfl:    LUMIGAN 0.01 % SOLN, , Disp: , Rfl:    metoprolol succinate (TOPROL-XL) 50 MG 24 hr tablet, Take 1 tablet (50 mg total) by mouth daily. with food, Disp: 90 tablet, Rfl: 1   pantoprazole (PROTONIX) 40 MG tablet, Take 1 tablet (40 mg total) by mouth daily., Disp: 90 tablet, Rfl: 1   pyridOXINE (VITAMIN B-6) 100 MG tablet, Take 100 mg by mouth daily., Disp: , Rfl:  Allergies  Allergen Reactions   Codeine Other (See Comments)    GI upset   Sulfonamide Derivatives     REACTION: severe GI upset    Objective:   There were no vitals taken for this visit.  Patient is well-developed, well-nourished in no acute distress.  Resting comfortably at home.  Head is normocephalic, atraumatic.  No labored breathing.  Speech is clear and coherent with logical content.   Patient is alert and oriented at baseline.    Assessment and Plan:   [name] was seen today for dysuria.  Diagnoses and all orders for this visit:  Dysuria Urinalysis revealed 1+ blood, 2+ protein, many bacteria. Culture pending.  -     Urinalysis, Routine w reflex microscopic -     Urine Culture -     Microscopic Examination  Acute cystitis with hematuria Was recently treated with Amoxil without complete resolution of symptoms. Will initiate below, change to regimen if warranted. Increase water intake anf avoid bladder irritants. Report new, worsening, or persistent symptoms.  -     cephALEXin (KEFLEX) 500 MG capsule; Take 1 capsule (500 mg total) by mouth 2 (two) times daily for 7 days.      Return if symptoms worsen or fail to improve.  Kari Baars, FNP-C [facility] [address] [place], [place] [address] [phone]  05/26/2022  Time spent with the patient: 13 minutes, of which >50% was spent in obtaining information about symptoms, reviewing previous labs, evaluations, and treatments, counseling about condition (please see the discussed topics above), and developing a plan to further investigate it; had a number of questions which I addressed.

## 2022-05-28 LAB — URINE CULTURE

## 2022-05-30 LAB — URINE CULTURE

## 2022-05-31 ENCOUNTER — Other Ambulatory Visit: Payer: Self-pay

## 2022-05-31 DIAGNOSIS — R399 Unspecified symptoms and signs involving the genitourinary system: Secondary | ICD-10-CM

## 2022-06-02 ENCOUNTER — Telehealth: Payer: Self-pay | Admitting: Nurse Practitioner

## 2022-06-02 NOTE — Telephone Encounter (Signed)
Lmtcb.

## 2022-06-02 NOTE — Telephone Encounter (Signed)
Pt calling back about this message. Can covering provider address? Please call back

## 2022-06-02 NOTE — Telephone Encounter (Signed)
Patient r/ c °

## 2022-06-02 NOTE — Telephone Encounter (Signed)
Pt wants to know if after taking cephALEXin (KEFLEX) 500 MG capsule  can cause her to feel like she has a stomach bug. She says that her stomach feels sore. Please call back

## 2022-06-02 NOTE — Telephone Encounter (Signed)
Patient aware any antibiotics can cause any GI symptoms. She states it has gotten better and will call back if she needs [place].

## 2022-06-09 ENCOUNTER — Other Ambulatory Visit: Payer: PPO

## 2022-06-09 ENCOUNTER — Telehealth: Payer: Self-pay | Admitting: Nurse Practitioner

## 2022-06-09 DIAGNOSIS — R399 Unspecified symptoms and signs involving the genitourinary system: Secondary | ICD-10-CM | POA: Diagnosis not present

## 2022-06-09 LAB — MICROSCOPIC EXAMINATION: Renal Epithel, UA: NONE SEEN /hpf

## 2022-06-09 LAB — URINALYSIS, ROUTINE W REFLEX MICROSCOPIC
Bilirubin, UA: NEGATIVE
Glucose, UA: NEGATIVE
Ketones, UA: NEGATIVE
Leukocytes,UA: NEGATIVE
Nitrite, UA: NEGATIVE
Specific Gravity, UA: >1.03 — ABNORMAL HIGH (ref 1.005–1.030)
Urobilinogen, Ur: 0.2 mg/dL (ref 0.2–1.0)
pH, UA: 5.5 (ref 5.0–7.5)

## 2022-06-10 NOTE — Telephone Encounter (Signed)
Patient return call. ?

## 2022-06-10 NOTE — Telephone Encounter (Signed)
Patient aware that urine culture has been sent off.

## 2022-06-11 LAB — URINE CULTURE

## 2022-07-04 ENCOUNTER — Other Ambulatory Visit: Payer: Self-pay | Admitting: Nurse Practitioner

## 2022-07-04 ENCOUNTER — Encounter: Payer: Self-pay | Admitting: Nurse Practitioner

## 2022-07-04 ENCOUNTER — Ambulatory Visit (INDEPENDENT_AMBULATORY_CARE_PROVIDER_SITE_OTHER): Payer: PPO | Admitting: Nurse Practitioner

## 2022-07-04 ENCOUNTER — Ambulatory Visit (INDEPENDENT_AMBULATORY_CARE_PROVIDER_SITE_OTHER): Payer: PPO

## 2022-07-04 VITALS — BP 139/78 | HR 63 | Temp 97.1°F | Resp 20 | Ht <= 58 in | Wt 119.0 lb

## 2022-07-04 DIAGNOSIS — I10 Essential (primary) hypertension: Secondary | ICD-10-CM

## 2022-07-04 DIAGNOSIS — Z780 Asymptomatic menopausal state: Secondary | ICD-10-CM | POA: Diagnosis not present

## 2022-07-04 DIAGNOSIS — K581 Irritable bowel syndrome with constipation: Secondary | ICD-10-CM

## 2022-07-04 DIAGNOSIS — E559 Vitamin D deficiency, unspecified: Secondary | ICD-10-CM | POA: Diagnosis not present

## 2022-07-04 DIAGNOSIS — E785 Hyperlipidemia, unspecified: Secondary | ICD-10-CM | POA: Diagnosis not present

## 2022-07-04 DIAGNOSIS — K449 Diaphragmatic hernia without obstruction or gangrene: Secondary | ICD-10-CM

## 2022-07-04 DIAGNOSIS — K219 Gastro-esophageal reflux disease without esophagitis: Secondary | ICD-10-CM | POA: Diagnosis not present

## 2022-07-04 DIAGNOSIS — M8588 Other specified disorders of bone density and structure, other site: Secondary | ICD-10-CM

## 2022-07-04 LAB — CMP14+EGFR: Potassium: 4.5 mmol/L (ref 3.5–5.2)

## 2022-07-04 LAB — LIPID PANEL

## 2022-07-04 LAB — CBC WITH DIFFERENTIAL/PLATELET
Immature Grans (Abs): 0 10*3/uL (ref 0.0–0.1)
RDW: 13.3 % (ref 11.7–15.4)

## 2022-07-04 MED ORDER — PANTOPRAZOLE SODIUM 40 MG PO TBEC
40.0000 mg | DELAYED_RELEASE_TABLET | Freq: Every day | ORAL | 1 refills | Status: DC
Start: 2022-07-04 — End: 2022-12-19

## 2022-07-04 MED ORDER — ATORVASTATIN CALCIUM 20 MG PO TABS: ORAL_TABLET | ORAL | 1 refills | Status: DC

## 2022-07-04 MED ORDER — METOPROLOL SUCCINATE ER 50 MG PO TB24: 50.0000 mg | ORAL_TABLET | Freq: Every day | ORAL | 1 refills | Status: DC

## 2022-07-04 NOTE — Progress Notes (Signed)
Subjective:    Patient ID: [name], female    DOB: [birth year], 72 y.o.   MRN: [id number]   Chief Complaint: medical management of chronic issues     HPI:  [name] is a 72 y.o. who identifies as a female who was assigned female at birth.   Social history: Lives with: husband Work history: retired   Water engineer in today for follow up of the following chronic medical issues:  1. Essential hypertension No c/o chest pain, sob or headache. Does not check blood pressure at home BP Readings from Last 3 Encounters:  04/27/22 134/79  04/18/22 (!) 156/78  02/24/22 122/71     2. Hyperlipidemia with target LDL less than 100 Does watch diet and exercises's daily Lab Results  Component Value Date   CHOL 162 12/28/2021   HDL 42 12/28/2021   LDLCALC 86 12/28/2021   TRIG 202 (H) 12/28/2021   CHOLHDL 3.9 12/28/2021   The 10-year ASCVD risk score (Arnett DK, et al., 2019) is: 19.6%   3. Gastroesophageal reflux disease without esophagitis Is on protonix and is doing well.  4. Diaphragmatic hernia without obstruction and without gangrene No issues unless she over eats  5. Irritable bowel syndrome with constipation Takes probiotics and stool softner daily and is doing well.  6. Osteopenia of lumbar spine Walks daily. Her last dexascan was done in 2020. She will repeat it today while she is here.  7. Vitamin D deficiency Is on a daily vitamin d supplement Last vitamin D Lab Results  Component Value Date   VD25OH 37.0 01/23/2017     New complaints: None today  Allergies  Allergen Reactions   Codeine Other (See Comments)    GI upset   Sulfonamide Derivatives     REACTION: severe GI upset   Outpatient Encounter Medications as of 07/04/2022  Medication Sig   ALPRAZolam (XANAX) 0.25 MG tablet Take 1 tablet (0.25 mg total) by mouth at bedtime as needed.   aspirin 81 MG chewable tablet Chew by mouth daily.   atorvastatin (LIPITOR) 20 MG tablet TAKE 1 TABLET BY MOUTH  DAILY AT 6PM   BIOTIN PO Take by mouth.   CALCIUM-MAGNESIUM PO Take by mouth.   Cranberry 400 MG CAPS Take by mouth.   LUMIGAN 0.01 % SOLN    metoprolol succinate (TOPROL-XL) 50 MG 24 hr tablet Take 1 tablet (50 mg total) by mouth daily. with food   pantoprazole (PROTONIX) 40 MG tablet Take 1 tablet (40 mg total) by mouth daily.   pyridOXINE (VITAMIN B-6) 100 MG tablet Take 100 mg by mouth daily.   No facility-administered encounter medications on file as of 07/04/2022.    Past Surgical History:  Procedure Laterality Date   CARDIAC CATHETERIZATION     COLPOSCOPY     excision of mole  2007   GYNECOLOGIC CRYOSURGERY     LAPAROSCOPIC CHOLECYSTECTOMY     TUBAL LIGATION  1991    Family History  Problem Relation Age of Onset   Hypertension Mother    Heart disease Mother    Hyperlipidemia Mother    Glaucoma Mother    Hypertension Father    Cancer Father        LUNG   Hyperlipidemia Father    Diabetes Sister    Hypertension Sister    Hyperlipidemia Sister    Fibromyalgia Daughter    Asthma Daughter       Controlled substance contract: 07/04/22-  patient takes xanax very seldom.  Review of Systems  Constitutional:  Negative for diaphoresis.  Eyes:  Negative for pain.  Respiratory:  Negative for shortness of breath.   Cardiovascular:  Negative for chest pain, palpitations and leg swelling.  Gastrointestinal:  Negative for abdominal pain.  Endocrine: Negative for polydipsia.  Skin:  Negative for rash.  Neurological:  Negative for dizziness, weakness and headaches.  Hematological:  Does not bruise/bleed easily.  All other systems reviewed and are negative.      Objective:   Physical Exam Vitals and nursing note reviewed.  Constitutional:      General: She is not in acute distress.    Appearance: Normal appearance. She is well-developed.  HENT:     Head: Normocephalic.     Right Ear: Tympanic membrane normal.     Left Ear: Tympanic membrane normal.     Nose:  Nose normal.     Mouth/Throat:     Mouth: Mucous membranes are moist.  Eyes:     Pupils: Pupils are equal, round, and reactive to light.  Neck:     Vascular: No carotid bruit or JVD.  Cardiovascular:     Rate and Rhythm: Normal rate and regular rhythm.     Heart sounds: Normal heart sounds.  Pulmonary:     Effort: Pulmonary effort is normal. No respiratory distress.     Breath sounds: Normal breath sounds. No wheezing or rales.  Chest:     Chest wall: No tenderness.  Abdominal:     General: Bowel sounds are normal. There is no distension or abdominal bruit.     Palpations: Abdomen is soft. There is no hepatomegaly, splenomegaly, mass or pulsatile mass.     Tenderness: There is no abdominal tenderness.  Musculoskeletal:        General: Normal range of motion.     Cervical back: Normal range of motion and neck supple.  Lymphadenopathy:     Cervical: No cervical adenopathy.  Skin:    General: Skin is warm and dry.  Neurological:     Mental Status: She is alert and oriented to person, place, and time.     Deep Tendon Reflexes: Reflexes are normal and symmetric.  Psychiatric:        Behavior: Behavior normal.        Thought Content: Thought content normal.        Judgment: Judgment normal.    BP 139/78   Pulse 63   Temp (!) 97.1 F (36.2 C) (Temporal)   Resp 20   Ht 4\' 10"  (1.473 m)   Wt 119 lb (54 kg)   SpO2 98%   BMI 24.87 kg/m         Assessment & Plan:   [name] comes in today with chief complaint of Medical Management of Chronic Issues   Diagnosis and orders addressed:  1. Essential hypertension Low sodium diet - metoprolol succinate (TOPROL-XL) 50 MG 24 hr tablet; Take 1 tablet (50 mg total) by mouth daily. with food  Dispense: 90 tablet; Refill: 1 - CBC with Differential/Platelet - CMP14+EGFR  2. Hyperlipidemia with target LDL less than 100 Low fat diet - atorvastatin (LIPITOR) 20 MG tablet; TAKE 1 TABLET BY MOUTH DAILY AT 6PM  Dispense: 90  tablet; Refill: 1 - Lipid panel  3. Gastroesophageal reflux disease without esophagitis Avoid spicy foods Do not eat 2 hours prior to bedtime - pantoprazole (PROTONIX) 40 MG tablet; Take 1 tablet (40 mg total) by mouth daily.  Dispense: 90 tablet; Refill: 1  4. Diaphragmatic hernia without obstruction and without gangrene Avoid over eating  5. Irritable bowel syndrome with constipation Continue stool softner and probiotic  6. Osteopenia of lumbar spine Weight bearing exercises Will send results of dexascan when available  7. Vitamin D deficiency Continue vitamin d supplement   Labs pending Health Maintenance reviewed Diet and exercise encouraged  Follow up plan: 6 months   Mary-Margaret Daphine Deutscher, FNP

## 2022-07-04 NOTE — Patient Instructions (Signed)
Bone Health Bones protect organs, store calcium, anchor muscles, and support the whole body. Keeping your bones strong is important, especially as you get older. You can take actions to help keep your bones strong and healthy. Why is keeping my bones healthy important?  Keeping your bones healthy is important because your body constantly replaces bone cells. Cells get old, and new cells take their place. As we age, we lose bone cells because the body may not be able to make enough new cells to replace the old cells. The amount of bone cells and bone tissue you have is referred to as bone mass. The higher your bone mass, the stronger your bones. The aging process leads to an overall loss of bone mass in the body, which can increase the likelihood of: Broken bones. A condition in which the bones become weak and brittle (osteoporosis). A large decline in bone mass occurs in older adults. In women, it occurs about the time of menopause. What actions can I take to keep my bones healthy? Good health habits are important for maintaining healthy bones. This includes eating nutritious foods and exercising regularly. To have healthy bones, you need to get enough of the right minerals and vitamins. Most nutrition experts recommend getting these nutrients from the foods that you eat. In some cases, taking supplements may also be recommended. Doing certain types of exercise is also important for bone health. What are the nutritional recommendations for healthy bones?  Eating a well-balanced diet with plenty of calcium and vitamin D will help to protect your bones. Nutritional recommendations vary from person to person. Ask your health care provider what is healthy for you. Here are some general guidelines. Get enough calcium Calcium is the most important (essential) mineral for bone health. Most people can get enough calcium from their diet, but supplements may be recommended for people who are at risk for  osteoporosis. Good sources of calcium include: Dairy products, such as low-fat or nonfat milk, cheese, and yogurt. Dark green leafy vegetables, such as bok choy and broccoli. Foods that have calcium added to them (are fortified). Foods that may be fortified with calcium include orange juice, cereal, bread, soy beverages, and tofu products. Nuts, such as almonds. Follow these recommended amounts for daily calcium intake: Infants, 0-6 months: 200 mg. Infants, 6-12 months: 260 mg. Children, age 1-3: 700 mg. Children, age 4-8: 1,000 mg. Children, age 9-13: 1,300 mg. Teens, age 14-18: 1,300 mg. Adults, age 19-50: 1,000 mg. Adults, age 51-70: Men: 1,000 mg. Women: 1,200 mg. Adults, age 71 or older: 1,200 mg. Pregnant and breastfeeding females: Teens: 1,300 mg. Adults: 1,000 mg. Get enough vitamin D Vitamin D is the most essential vitamin for bone health. It helps the body absorb calcium. Sunlight stimulates the skin to make vitamin D, so be sure to get enough sunlight. If you live in a cold climate or you do not get outside often, your health care provider may recommend that you take vitamin D supplements. Good sources of vitamin D in your diet include: Egg yolks. Saltwater fish. Milk and cereal fortified with vitamin D. Follow these recommended amounts for daily vitamin D intake: Infants, 0-12 months: 400 international units (IU). Children and teens, age 1-18: 600 international units. Adults, age 59 or younger: 600 international units. Adults, age 60 or older: 600-1,000 international units. Get other important nutrients Other nutrients that are important for bone health include: Phosphorus. This mineral is found in meat, poultry, dairy foods, nuts, and legumes. The   recommended daily intake for adult men and adult women is 700 mg. Magnesium. This mineral is found in seeds, nuts, dark green vegetables, and legumes. The recommended daily intake for adult men is 400-420 mg. For adult women,  it is 310-320 mg. Vitamin K. This vitamin is found in green leafy vegetables. The recommended daily intake is 120 mcg for adult men and 90 mcg for adult women. What type of physical activity is best for building and maintaining healthy bones? Weight-bearing and strength-building activities are important for building and maintaining healthy bones. Weight-bearing activities cause muscles and bones to work against gravity. Strength-building activities increase the strength of the muscles that support bones. Weight-bearing and muscle-building activities include: Walking and hiking. Jogging and running. Dancing. Gym exercises. Lifting weights. Tennis and racquetball. Climbing stairs. Aerobics. Adults should get at least 30 minutes of moderate physical activity on most days. Children should get at least 60 minutes of moderate physical activity on most days. Ask your health care provider what type of exercise is best for you. How can I find out if my bone mass is low? Bone mass can be measured with an X-ray test called a bone mineral density (BMD) test. This test is recommended for all women who are age 65 or older. It may also be recommended for: Men who are age 70 or older. People who are at risk for osteoporosis because of: Having a long-term disease that weakens bones, such as kidney disease or rheumatoid arthritis. Having menopause earlier than normal. Taking medicine that weakens bones, such as steroids, thyroid hormones, or hormone treatment for breast cancer or prostate cancer. Smoking. Drinking three or more alcoholic drinks a day. Being underweight. Sedentary lifestyle. If you find that you have a low bone mass, you may be able to prevent osteoporosis or further bone loss by changing your diet and lifestyle. Where can I find more information? Bone Health & Osteoporosis Foundation: www.nof.org/patients National Institutes of Health: www.bones.nih.gov International Osteoporosis  Foundation: www.iofbonehealth.org Summary The aging process leads to an overall loss of bone mass in the body, which can increase the likelihood of broken bones and osteoporosis. Eating a well-balanced diet with plenty of calcium and vitamin D will help to protect your bones. Weight-bearing and strength-building activities are also important for building and maintaining strong bones. Bone mass can be measured with an X-ray test called a bone mineral density (BMD) test. This information is not intended to replace advice given to you by your health care provider. Make sure you discuss any questions you have with your health care provider. Document Revised: 06/03/2020 Document Reviewed: 06/03/2020 Elsevier Patient Education  2024 Elsevier Inc.  

## 2022-07-05 LAB — CMP14+EGFR
ALT: 22 IU/L (ref 0–32)
AST: 24 IU/L (ref 0–40)
Albumin: 4.3 g/dL (ref 3.8–4.8)
Alkaline Phosphatase: 95 IU/L (ref 44–121)
BUN/Creatinine Ratio: 25 (ref 12–28)
BUN: 18 mg/dL (ref 8–27)
Bilirubin Total: 0.3 mg/dL (ref 0.0–1.2)
CO2: 21 mmol/L (ref 20–29)
Calcium: 9.9 mg/dL (ref 8.7–10.3)
Chloride: 103 mmol/L (ref 96–106)
Creatinine, Ser: 0.72 mg/dL (ref 0.57–1.00)
Globulin, Total: 2.2 g/dL (ref 1.5–4.5)
Glucose: 92 mg/dL (ref 70–99)
Sodium: 141 mmol/L (ref 134–144)
Total Protein: 6.5 g/dL (ref 6.0–8.5)
eGFR: 88 mL/min/{1.73_m2} (ref >59)

## 2022-07-05 LAB — LIPID PANEL
Triglycerides: 227 mg/dL — ABNORMAL HIGH (ref 0–149)
VLDL Cholesterol Cal: 38 mg/dL (ref 5–40)

## 2022-07-05 LAB — CBC WITH DIFFERENTIAL/PLATELET
Basophils Absolute: 0 10*3/uL (ref 0.0–0.2)
Basos: 0 %
EOS (ABSOLUTE): 0.2 10*3/uL (ref 0.0–0.4)
Eos: 4 %
Hematocrit: 40.2 % (ref 34.0–46.6)
Hemoglobin: 13.2 g/dL (ref 11.1–15.9)
Immature Granulocytes: 0 %
Lymphocytes Absolute: 2.3 10*3/uL (ref 0.7–3.1)
Lymphs: 35 %
MCH: 30.3 pg (ref 26.6–33.0)
MCHC: 32.8 g/dL (ref 31.5–35.7)
MCV: 92 fL (ref 79–97)
Monocytes Absolute: 0.6 10*3/uL (ref 0.1–0.9)
Monocytes: 8 %
Neutrophils Absolute: 3.5 10*3/uL (ref 1.4–7.0)
Neutrophils: 53 %
Platelets: 217 10*3/uL (ref 150–450)
RBC: 4.35 x10E6/uL (ref 3.77–5.28)
WBC: 6.7 10*3/uL (ref 3.4–10.8)

## 2022-07-06 DIAGNOSIS — Z780 Asymptomatic menopausal state: Secondary | ICD-10-CM | POA: Diagnosis not present

## 2022-07-06 DIAGNOSIS — M8589 Other specified disorders of bone density and structure, multiple sites: Secondary | ICD-10-CM | POA: Diagnosis not present

## 2022-07-11 ENCOUNTER — Ambulatory Visit (INDEPENDENT_AMBULATORY_CARE_PROVIDER_SITE_OTHER): Payer: PPO | Admitting: Nurse Practitioner

## 2022-07-11 ENCOUNTER — Encounter: Payer: Self-pay | Admitting: Nurse Practitioner

## 2022-07-11 VITALS — BP 132/79 | HR 76 | Temp 97.8°F | Resp 20 | Ht <= 58 in | Wt 117.0 lb

## 2022-07-11 DIAGNOSIS — R300 Dysuria: Secondary | ICD-10-CM | POA: Diagnosis not present

## 2022-07-11 LAB — URINALYSIS, COMPLETE
Bilirubin, UA: NEGATIVE
Glucose, UA: NEGATIVE
Leukocytes,UA: NEGATIVE
Nitrite, UA: POSITIVE — AB
Specific Gravity, UA: 1.015 (ref 1.005–1.030)
Urobilinogen, Ur: 2 mg/dL — ABNORMAL HIGH (ref 0.2–1.0)
pH, UA: 5 (ref 5.0–7.5)

## 2022-07-11 LAB — MICROSCOPIC EXAMINATION
Epithelial Cells (non renal): NONE SEEN /hpf (ref 0–10)
Renal Epithel, UA: NONE SEEN /hpf

## 2022-07-11 MED ORDER — CEPHALEXIN 500 MG PO CAPS: 500.0000 mg | ORAL_CAPSULE | Freq: Two times a day (BID) | ORAL | 0 refills | Status: DC

## 2022-07-11 NOTE — Patient Instructions (Signed)
Take medication as prescribe Cotton underwear Take shower not bath Cranberry juice, yogurt Force fluids AZO over the counter X2 days Culture pending RTO prn  

## 2022-07-11 NOTE — Progress Notes (Signed)
   Subjective:    Patient ID: [name], female    DOB: [birth year], 72 y.o.   MRN: [id number]   Chief Complaint: Dysuria   Dysuria  This is a new problem. The current episode started in the past 7 days. The problem has been rapidly worsening. The quality of the pain is described as aching and burning. The pain is at a severity of 5/10. The pain is moderate. There has been no fever. She is Sexually active. There is No history of pyelonephritis. Associated symptoms include flank pain, frequency, hesitancy and urgency. She has tried nothing for the symptoms. The treatment provided mild relief.      Patient Active Problem List   Diagnosis Date Noted   Essential hypertension 03/28/2017   GERD (gastroesophageal reflux disease) 06/12/2012   Vitamin D deficiency 06/12/2012   Osteopenia    Hyperlipidemia with target LDL less than 100 08/05/2009   I B S-CONSTIPATION PREDOMINATE 01/31/2007   ESOPHAGEAL STRICTURE 01/27/2006   Diaphragmatic hernia 01/27/2006   HEMORRHOIDS, EXTERNAL 09/28/2003       Review of Systems  Genitourinary:  Positive for dysuria, flank pain, frequency, hesitancy and urgency.       Objective:   Physical Exam Vitals reviewed.  Constitutional:      Appearance: Normal appearance.  Cardiovascular:     Rate and Rhythm: Normal rate and regular rhythm.     Heart sounds: Normal heart sounds.  Pulmonary:     Effort: Pulmonary effort is normal.     Breath sounds: Normal breath sounds.  Skin:    General: Skin is warm.  Neurological:     General: No focal deficit present.     Mental Status: She is alert and oriented to person, place, and time.  Psychiatric:        Mood and Affect: Mood normal.        Behavior: Behavior normal.     BP 132/79   Pulse 76   Temp 97.8 F (36.6 C) (Temporal)   Resp 20   Ht 4\' 10"  (1.473 m)   Wt 117 lb (53.1 kg)   SpO2 96%   BMI 24.45 kg/m        Assessment & Plan:  Oran Rein in today with chief complaint of  Dysuria   1. Dysuria Take medication as prescribe Cotton underwear Take shower not bath Cranberry juice, yogurt Force fluids AZO over the counter X2 days Culture pending RTO prn  Meds ordered this encounter  Medications   cephALEXin (KEFLEX) 500 MG capsule    Sig: Take 1 capsule (500 mg total) by mouth 2 (two) times daily.    Dispense:  14 capsule    Refill:  0    Order Specific Question:   Supervising Provider    Answer:   Arville Care A [[id number]]    - Urinalysis, Complete - Urine Culture    The above assessment and management plan was discussed with the patient. The patient verbalized understanding of and has agreed to the management plan. Patient is aware to call the clinic if symptoms persist or worsen. Patient is aware when to return to the clinic for a follow-up visit. Patient educated on when it is appropriate to go to the emergency department.   Mary-Margaret Daphine Deutscher, FNP

## 2022-07-12 LAB — URINE CULTURE

## 2022-07-14 ENCOUNTER — Emergency Department (HOSPITAL_COMMUNITY)
Admission: EM | Admit: 2022-07-14 | Discharge: 2022-07-14 | Disposition: A | Discharge status: Home / Self Care | Payer: PPO | Attending: Emergency Medicine | Admitting: Emergency Medicine

## 2022-07-14 ENCOUNTER — Emergency Department (HOSPITAL_COMMUNITY): Payer: PPO

## 2022-07-14 ENCOUNTER — Telehealth: Payer: Self-pay | Admitting: Nurse Practitioner

## 2022-07-14 ENCOUNTER — Other Ambulatory Visit: Payer: Self-pay

## 2022-07-14 ENCOUNTER — Encounter (HOSPITAL_COMMUNITY): Payer: Self-pay | Admitting: Emergency Medicine

## 2022-07-14 ENCOUNTER — Other Ambulatory Visit: Payer: Self-pay | Admitting: Nurse Practitioner

## 2022-07-14 DIAGNOSIS — R109 Unspecified abdominal pain: Secondary | ICD-10-CM | POA: Diagnosis present

## 2022-07-14 DIAGNOSIS — Z7982 Long term (current) use of aspirin: Secondary | ICD-10-CM | POA: Diagnosis not present

## 2022-07-14 DIAGNOSIS — Z79899 Other long term (current) drug therapy: Secondary | ICD-10-CM | POA: Insufficient documentation

## 2022-07-14 DIAGNOSIS — I10 Essential (primary) hypertension: Secondary | ICD-10-CM | POA: Diagnosis not present

## 2022-07-14 DIAGNOSIS — N201 Calculus of ureter: Secondary | ICD-10-CM

## 2022-07-14 DIAGNOSIS — K7689 Other specified diseases of liver: Secondary | ICD-10-CM | POA: Diagnosis not present

## 2022-07-14 DIAGNOSIS — K5730 Diverticulosis of large intestine without perforation or abscess without bleeding: Secondary | ICD-10-CM | POA: Diagnosis not present

## 2022-07-14 DIAGNOSIS — N132 Hydronephrosis with renal and ureteral calculous obstruction: Secondary | ICD-10-CM | POA: Diagnosis not present

## 2022-07-14 DIAGNOSIS — K449 Diaphragmatic hernia without obstruction or gangrene: Secondary | ICD-10-CM | POA: Diagnosis not present

## 2022-07-14 DIAGNOSIS — N130 Hydronephrosis with ureteropelvic junction obstruction: Secondary | ICD-10-CM | POA: Diagnosis not present

## 2022-07-14 LAB — CBC
HCT: 43.3 % (ref 36.0–46.0)
Hemoglobin: 13.9 g/dL (ref 12.0–15.0)
MCH: 30.3 pg (ref 26.0–34.0)
MCHC: 32.1 g/dL (ref 30.0–36.0)
MCV: 94.3 fL (ref 80.0–100.0)
Platelets: 227 10*3/uL (ref 150–400)
RBC: 4.59 MIL/uL (ref 3.87–5.11)
RDW: 13.4 % (ref 11.5–15.5)
WBC: 7.9 10*3/uL (ref 4.0–10.5)
nRBC: 0 % (ref 0.0–0.2)

## 2022-07-14 LAB — BASIC METABOLIC PANEL
Anion gap: 8 (ref 5–15)
BUN: 14 mg/dL (ref 8–23)
CO2: 27 mmol/L (ref 22–32)
Calcium: 9.6 mg/dL (ref 8.9–10.3)
Chloride: 103 mmol/L (ref 98–111)
Creatinine, Ser: 0.72 mg/dL (ref 0.44–1.00)
GFR, Estimated: >60 mL/min (ref >60)
Glucose, Bld: 106 mg/dL — ABNORMAL HIGH (ref 70–99)
Potassium: 4.2 mmol/L (ref 3.5–5.1)
Sodium: 138 mmol/L (ref 135–145)

## 2022-07-14 LAB — URINALYSIS, ROUTINE W REFLEX MICROSCOPIC
Bacteria, UA: NONE SEEN
Bilirubin Urine: NEGATIVE
Glucose, UA: NEGATIVE mg/dL
Ketones, ur: NEGATIVE mg/dL
Leukocytes,Ua: NEGATIVE
Nitrite: POSITIVE — AB
Protein, ur: 30 mg/dL — AB
Specific Gravity, Urine: 1.009 (ref 1.005–1.030)
pH: 7 (ref 5.0–8.0)

## 2022-07-14 MED ORDER — IOHEXOL 300 MG/ML  SOLN
100.0000 mL | Freq: Once | INTRAMUSCULAR | Status: AC | PRN
  Administered 2022-07-14: 100 mL via INTRAVENOUS

## 2022-07-14 MED ORDER — TAMSULOSIN HCL 0.4 MG PO CAPS: 0.4000 mg | ORAL_CAPSULE | Freq: Every day | ORAL | 0 refills | Status: DC

## 2022-07-14 MED ORDER — DOXYCYCLINE HYCLATE 100 MG PO TABS: 100.0000 mg | ORAL_TABLET | Freq: Two times a day (BID) | ORAL | 0 refills | Status: DC

## 2022-07-14 MED ORDER — KETOROLAC TROMETHAMINE 30 MG/ML IJ SOLN
30.0000 mg | Freq: Once | INTRAMUSCULAR | Status: AC
  Administered 2022-07-14: 30 mg via INTRAMUSCULAR
  Filled 2022-07-14: qty 1

## 2022-07-14 NOTE — Telephone Encounter (Signed)
Will change to doxycycline- prescription sent to pharmacy

## 2022-07-14 NOTE — ED Triage Notes (Signed)
Pt via POV c/o recurrent UTI with pain to left abdomen and left flank and bladder pressure. Pain rated 5-8/10 in waves. Pt denies n/v/hematuria but feels frequency with low urine volumes.

## 2022-07-14 NOTE — ED Provider Notes (Signed)
[place] EMERGENCY DEPARTMENT AT [facility] Provider Note   CSN: [id number] Arrival date & time: 07/14/22  1255     History  Chief Complaint  Patient presents with   Urinary Retention   Flank Pain    [name] is a 72 y.o. female with HLD, HTN, GERD esiophageal stricture, diaphragmatic hernia, who presents with concern for continued UTI with pain to left abdomen and left flank and bladder pressure. Feels "pressure in her bottom," then this morning started to have pain in her left back and left side of her abdomen. Still having urinary frequency. Now rated 3/10 but was worse. Denies nausea/vomiting, f/c. Had dysuria but AZO helps that some. Was diagnosed w/ UTI on 7/8 and given keflex. Has been taking it for 3 days but not improving, actually feels she is getting worse. Per chart review had mucous and nitrites in urine w/ symptoms so diagnosed UTI. Urine culture grew mixed flora.     Flank Pain       Home Medications Prior to Admission medications   Medication Sig Start Date End Date Taking? Authorizing Provider  tamsulosin (FLOMAX) 0.4 MG CAPS capsule Take 1 capsule (0.4 mg total) by mouth daily after supper. 07/14/22  Yes Loetta Rough, MD  ALPRAZolam Prudy Feeler) 0.25 MG tablet Take 1 tablet (0.25 mg total) by mouth at bedtime as needed. 09/11/19   Bennie Pierini, FNP  aspirin 81 MG chewable tablet Chew by mouth daily.    [provider]  atorvastatin (LIPITOR) 20 MG tablet TAKE 1 TABLET BY MOUTH DAILY AT [facility] 07/04/22   Daphine Deutscher, Mary-Margaret, FNP  BIOTIN PO Take by mouth.    [provider]  CALCIUM-MAGNESIUM PO Take by mouth.    [provider]  cephALEXin (KEFLEX) 500 MG capsule Take 1 capsule (500 mg total) by mouth 2 (two) times daily. 07/11/22   Daphine Deutscher Mary-Margaret, FNP  Cranberry 400 MG CAPS Take by mouth.    [provider]  doxycycline (VIBRA-TABS) 100 MG tablet Take 1 tablet (100 mg total) by mouth 2 (two) times daily.  1 po bid 07/14/22   Bennie Pierini, FNP  LUMIGAN 0.01 % SOLN  01/12/16   [provider]  metoprolol succinate (TOPROL-XL) 50 MG 24 hr tablet Take 1 tablet (50 mg total) by mouth daily. with food 07/04/22   Daphine Deutscher, Mary-Margaret, FNP  pantoprazole (PROTONIX) 40 MG tablet Take 1 tablet (40 mg total) by mouth daily. 07/04/22   Daphine Deutscher, Mary-Margaret, FNP  pyridOXINE (VITAMIN B-6) 100 MG tablet Take 100 mg by mouth daily.    [provider]      Allergies    Codeine and Sulfonamide derivatives    Review of Systems   Review of Systems  Genitourinary:  Positive for flank pain.   A 10 point review of systems was performed and is negative unless otherwise reported in HPI.  Physical Exam Updated Vital Signs BP (!) 141/74 (BP Location: Right Arm)   Pulse 61   Temp 98.2 F (36.8 C) (Oral)   Resp 18   Ht 4\' 10"  (1.473 m)   Wt 53.1 kg   SpO2 94%   BMI 24.45 kg/m  Physical Exam General: Normal appearing female, lying in bed.  HEENT:  Sclera anicteric, MMM, trachea midline.  Cardiology: RRR, no murmurs/rubs/gallops.  Resp: Normal respiratory rate and effort.  Abd: Soft, non-tender, non-distended. No rebound tenderness or guarding.  GU: Deferred. MSK: No peripheral edema or signs of trauma Skin: warm, dry.  Back: No CVA tenderness Neuro: A&Ox4, CNs II-XII grossly intact. MAEs.  Psych: Normal mood and affect.   ED Results / Procedures / Treatments   Labs (all labs ordered are listed, but only abnormal results are displayed) Labs Reviewed  URINALYSIS, ROUTINE W REFLEX MICROSCOPIC - Abnormal; Notable for the following components:      Result Value   Color, Urine AMBER (*)    Hgb urine dipstick SMALL (*)    Protein, ur 30 (*)    Nitrite POSITIVE (*)    All other components within normal limits  BASIC METABOLIC PANEL - Abnormal; Notable for the following components:   Glucose, Bld 106 (*)    All other components within normal limits  URINE CULTURE  CBC     EKG None  Radiology CT ABDOMEN PELVIS W CONTRAST  Result Date: 07/14/2022 CLINICAL DATA:  Abdominal pain, left flank pain EXAM: CT ABDOMEN AND PELVIS WITH CONTRAST TECHNIQUE: Multidetector CT imaging of the abdomen and pelvis was performed using the standard protocol following bolus administration of intravenous contrast. RADIATION DOSE REDUCTION: This exam was performed according to the departmental dose-optimization program which includes automated exposure control, adjustment of the mA and/or kV according to patient size and/or use of iterative reconstruction technique. CONTRAST:  OMNIPAQUE IOHEXOL 300 MG/ML  SOLN COMPARISON:  10/20/2006 FINDINGS: Lower chest: Visualized lower lung fields are clear. Heart is enlarged in size. Hepatobiliary: There is 1.8 cm low-density lesion in the right lobe liver, possibly a cyst. There is 1.9 cm low-density lesion in the left lobe of liver. There is no dilation of bile ducts. Surgical clips are seen in gallbladder fossa. Pancreas: No focal abnormalities are seen. Spleen: Unremarkable. Adrenals/Urinary Tract: Adrenals are unremarkable. There is mild-to-moderate left hydronephrosis. Left ureter is dilated. There is a 6 mm calculus at the left ureterovesical junction. There are no renal stones. There is a 9 mm exophytic lesion in the posterior upper pole of left kidney suggesting renal cyst. Urinary bladder is unremarkable. Stomach/Bowel: Small hiatal hernia is seen. Small bowel loops are not dilated. The appendix is not dilated. There is no significant wall thickening in colon. There is no pericolic stranding. Diverticula are seen descending and sigmoid colon. There is no evidence of focal acute diverticulitis. Vascular/Lymphatic: Scattered arterial calcifications are seen. Reproductive: There is 2.2 cm a fluid density structure in the right adnexa. Other: There is no ascites or pneumoperitoneum. Musculoskeletal: Degenerative changes are noted in lumbar spine  with significant encroachment of left neural foramen at L5-S1 level. IMPRESSION: There is mild to moderate left hydronephrosis caused by 6 mm calculus at the left ureterovesical junction. There is no evidence of intestinal obstruction or pneumoperitoneum. There is 2.2 cm fluid density structure in the right adnexa. Follow-up pelvic sonogram in nonemergent setting may be considered. Hepatic cysts. There are a few hepatic and renal cysts. Diverticulosis of colon without signs of focal diverticulitis. Small hiatal hernia. Aortic arteriosclerosis. Lumbar spondylosis. Electronically Signed   By: Ernie Avena M.D.   On: 07/14/2022 16:58    Procedures Procedures    Medications Ordered in ED Medications  iohexol (OMNIPAQUE) 300 MG/ML solution 100 mL (100 mLs Intravenous Contrast Given 07/14/22 1627)  ketorolac (TORADOL) 30 MG/ML injection 30 mg (30 mg Intramuscular Given 07/14/22 1827)    ED Course/ Medical Decision Making/ A&P                          Medical Decision Making Amount and/or Complexity  of Data Reviewed Labs: ordered. Decision-making details documented in ED Course. Radiology: ordered. Decision-making details documented in ED Course.  Risk Prescription drug management.    This patient presents to the ED for concern of continued UTI symptoms, this involves an extensive number of treatment options, and is a complaint that carries with it a high risk of complications and morbidity.  I considered the following differential and admission for this acute, potentially life threatening condition.   MDM:    Consider ongoing UTI inadequately treated resistant bacteria, cannot check culture given that it grew mixed flora.  She has been taking her Keflex, no concern for antibiotic noncompliance.  Consider pyelonephritis, nephrolithiasis/ureterolithiasis.  She states that the pain was worse earlier and now has improved, she has no CVA tenderness or abdominal tenderness on exam, possible the  patient passed a stone.  Patient is afebrile and overall well-appearing, hemodynamically stable, no concern for sepsis.  Consider other cause abdominal pain including diverticulitis, gastritis.  No vaginal symptoms or gastric symptoms to indicate gynecologic or GI pathology such as colitis.  No epigastric tenderness or right lower quadrant tenderness to indicate pancreatitis or appendicitis.  Clinical Course as of 07/14/22 1915  Thu Jul 14, 2022  1648 Urinalysis, Routine w reflex microscopic -Urine, Clean Catch(!) Nitrite positive with no WBCs or bacteria, though is taking keflex.  [HN]  1649 WBC: 7.9 No leukocytosis [HN]  1649 Creatinine: 0.72 wnl [HN]  1713 CT ABDOMEN PELVIS W CONTRAST 6mm calculus at L UVJ w/ mild-moderate L hydronephrosis [HN]  1807 Pt is not febrile, tachycardic, no white count. No c/f sepsis or septic stone.   D/w Dr. Benancio Deeds. Pt with possible UTI and stone. Suggested IM toradol, cathed urine culture, DC on flomax, and f/u tomorrow in clinic with Dr. Ronne Binning.   D/w patient and will have her continue her keflex. Given her strict return precautions including fever, worsening pain.  [HN]    Clinical Course User Index [HN] Loetta Rough, MD     Labs: I Ordered, and personally interpreted labs.  The pertinent results include: Those listed above  Imaging Studies ordered: I ordered imaging studies including CT of the pelvis with contrast I independently visualized and interpreted imaging. I agree with the radiologist interpretation  Additional history obtained from husband at bedside, chart review.    Reevaluation: After the interventions noted above, I reevaluated the patient and found that they have :stayed the same  Social Determinants of Health:  Patient lives independently  Disposition:  DC  Co morbidities that complicate the patient evaluation  Past Medical History:  Diagnosis Date   Allergic rhinitis    Atypical mole 05/10/2005   mod-severe-  right outer central back   Atypical mole 07/20/2011   moderate- back of right knee   Atypical mole 10/22/2013   mild- left anterior thigh sup   Cerumen impaction    right    Chest discomfort    Chronic constipation    Diverticulosis    GERD (gastroesophageal reflux disease)    Glaucoma    Hyperlipidemia    Kidney stones    Nasal congestion    Osteopenia 03/2018   T score -2.0 FRAX 12% / 2.2% stable from prior DEXA   Palpitation    PUD (peptic ulcer disease)    Scoliosis    Sinusitis    Vitamin D deficiency      Medicines Meds ordered this encounter  Medications   iohexol (OMNIPAQUE) 300 MG/ML solution 100 mL   ketorolac (  TORADOL) 30 MG/ML injection 30 mg   tamsulosin (FLOMAX) 0.4 MG CAPS capsule    Sig: Take 1 capsule (0.4 mg total) by mouth daily after supper.    Dispense:  30 capsule    Refill:  0    I have reviewed the patients home medicines and have made adjustments as needed  Problem List / ED Course: Problem List Items Addressed This Visit   None Visit Diagnoses     Calculus of ureterovesical junction (UVJ)    -  Primary                   This note was created using dictation software, which may contain spelling or grammatical errors.    Loetta Rough, MD 07/14/22 [phone]

## 2022-07-14 NOTE — Telephone Encounter (Signed)
Patient notified and verbalized understanding. She states that she is currently at the ER because the pain got so bad and she is worried it may be a kidney stone.

## 2022-07-14 NOTE — Telephone Encounter (Signed)
Please review

## 2022-07-14 NOTE — Discharge Instructions (Addendum)
Thank you for coming to [facility] Emergency Department. You were seen for flank and abdominal pain. We did an exam, labs, and imaging, and these showed a 6mm stone in your left ureter. Please call Dr. Dimas Millin office in the morning to be seen in his clinic tomorrow. Please take 0.4 mg tamsulosin every evening to help the stone pass. Continue taking your keflex. You can alternate taking Tylenol and ibuprofen as needed for pain. You can take 650mg  tylenol (acetaminophen) every 4-6 hours, and 600 mg ibuprofen 3 times a day.   Do not hesitate to return to the ED or call 911 if you experience: -Worsening symptoms -Severe or worsening abdominal or flank pain -Lightheadedness, passing out -Fevers/chills -Anything else that concerns you

## 2022-07-15 ENCOUNTER — Ambulatory Visit: Payer: PPO | Admitting: Urology

## 2022-07-15 ENCOUNTER — Ambulatory Visit (HOSPITAL_COMMUNITY)
Admission: RE | Admit: 2022-07-15 | Discharge: 2022-07-15 | Disposition: A | Discharge status: Home / Self Care | Payer: PPO | Source: Ambulatory Visit | Attending: Urology | Admitting: Urology

## 2022-07-15 ENCOUNTER — Other Ambulatory Visit: Payer: Self-pay

## 2022-07-15 ENCOUNTER — Other Ambulatory Visit: Payer: Self-pay | Admitting: Urology

## 2022-07-15 VITALS — BP 126/71 | HR 70

## 2022-07-15 DIAGNOSIS — N200 Calculus of kidney: Secondary | ICD-10-CM

## 2022-07-15 DIAGNOSIS — N201 Calculus of ureter: Secondary | ICD-10-CM

## 2022-07-15 LAB — URINALYSIS, ROUTINE W REFLEX MICROSCOPIC
Bilirubin, UA: NEGATIVE
Glucose, UA: NEGATIVE
Nitrite, UA: NEGATIVE
Specific Gravity, UA: 1.025 (ref 1.005–1.030)
Urobilinogen, Ur: 0.2 mg/dL (ref 0.2–1.0)
pH, UA: 5.5 (ref 5.0–7.5)

## 2022-07-15 LAB — MICROSCOPIC EXAMINATION

## 2022-07-15 MED ORDER — HYDROCODONE-ACETAMINOPHEN 5-325 MG PO TABS
1.0000 | ORAL_TABLET | Freq: Four times a day (QID) | ORAL | 0 refills | Status: DC | PRN
Start: 2022-07-15 — End: 2022-12-19

## 2022-07-15 NOTE — Progress Notes (Signed)
07/15/2022 11:32 AM   Oran Rein 1950/03/08 [id number]  Referring provider: Bennie Pierini, FNP [address] [place],  [place] [address]  nephrolithiasis   HPI: Ms [name] is a 72yo here for evaluation of nephrolithiasis. She presented to the ER yesterday and underwent CT which showed a 6mm left distal ureteral calculus. This is her second stone event. KUb from today shows left distal ureteral calculus. She has urinary frequency and urgency. No hematuria. She has had the LUTS fro the past 4 months.    PMH: Past Medical History:  Diagnosis Date   Allergic rhinitis    Atypical mole 05/10/2005   mod-severe- right outer central back   Atypical mole 07/20/2011   moderate- back of right knee   Atypical mole 10/22/2013   mild- left anterior thigh sup   Cerumen impaction    right    Chest discomfort    Chronic constipation    Diverticulosis    GERD (gastroesophageal reflux disease)    Glaucoma    Hyperlipidemia    Kidney stones    Nasal congestion    Osteopenia 03/2018   T score -2.0 FRAX 12% / 2.2% stable from prior DEXA   Palpitation    PUD (peptic ulcer disease)    Scoliosis    Sinusitis    Vitamin D deficiency     Surgical History: Past Surgical History:  Procedure Laterality Date   CARDIAC CATHETERIZATION     COLPOSCOPY     excision of mole  2007   GYNECOLOGIC CRYOSURGERY     LAPAROSCOPIC CHOLECYSTECTOMY     TUBAL LIGATION  1991    Home Medications:  Allergies as of 07/15/2022       Reactions   Codeine Other (See Comments)   GI upset   Sulfonamide Derivatives    REACTION: severe GI upset        Medication List        Accurate as of July 15, 2022 11:32 AM. If you have any questions, ask your nurse or doctor.          ALPRAZolam 0.25 MG tablet Commonly known as: XANAX Take 1 tablet (0.25 mg total) by mouth at bedtime as needed.   aspirin 81 MG chewable tablet Chew by mouth daily.   atorvastatin 20 MG tablet Commonly known as:  LIPITOR TAKE 1 TABLET BY MOUTH DAILY AT 6PM   BIOTIN PO Take by mouth.   CALCIUM-MAGNESIUM PO Take by mouth.   cephALEXin 500 MG capsule Commonly known as: Keflex Take 1 capsule (500 mg total) by mouth 2 (two) times daily.   Cranberry 400 MG Caps Take by mouth.   doxycycline 100 MG tablet Commonly known as: VIBRA-TABS Take 1 tablet (100 mg total) by mouth 2 (two) times daily. 1 po bid   Lumigan 0.01 % Soln Generic drug: bimatoprost   metoprolol succinate 50 MG 24 hr tablet Commonly known as: TOPROL-XL Take 1 tablet (50 mg total) by mouth daily. with food   pantoprazole 40 MG tablet Commonly known as: PROTONIX Take 1 tablet (40 mg total) by mouth daily.   pyridOXINE 100 MG tablet Commonly known as: VITAMIN B6 Take 100 mg by mouth daily.   tamsulosin 0.4 MG Caps capsule Commonly known as: FLOMAX Take 1 capsule (0.4 mg total) by mouth daily after supper.        Allergies:  Allergies  Allergen Reactions   Codeine Other (See Comments)    GI upset   Sulfonamide Derivatives  REACTION: severe GI upset    Family History: Family History  Problem Relation Age of Onset   Hypertension Mother    Heart disease Mother    Hyperlipidemia Mother    Glaucoma Mother    Hypertension Father    Cancer Father        LUNG   Hyperlipidemia Father    Diabetes Sister    Hypertension Sister    Hyperlipidemia Sister    Fibromyalgia Daughter    Asthma Daughter     Social History:  reports that she has never smoked. She has never used smokeless tobacco. She reports current alcohol use. She reports that she does not use drugs.  ROS: All other review of systems were reviewed and are negative except what is noted above in HPI  Physical Exam: BP 126/71   Pulse 70   Constitutional:  Alert and oriented, No acute distress. HEENT: [place] AT, moist mucus membranes.  Trachea midline, no masses. Cardiovascular: No clubbing, cyanosis, or edema. Respiratory: Normal respiratory  effort, no increased work of breathing. GI: Abdomen is soft, nontender, nondistended, no abdominal masses GU: No CVA tenderness.  Lymph: No cervical or inguinal lymphadenopathy. Skin: No rashes, bruises or suspicious lesions. Neurologic: Grossly intact, no focal deficits, moving all 4 extremities. Psychiatric: Normal mood and affect.  Laboratory Data: Lab Results  Component Value Date   WBC 7.9 07/14/2022   HGB 13.9 07/14/2022   HCT 43.3 07/14/2022   MCV 94.3 07/14/2022   PLT 227 07/14/2022    Lab Results  Component Value Date   CREATININE 0.72 07/14/2022    No results found for: "PSA"  No results found for: "TESTOSTERONE"  No results found for: "HGBA1C"  Urinalysis    Component Value Date/Time   COLORURINE AMBER (A) 07/14/2022 1449   APPEARANCEUR CLEAR 07/14/2022 1449   APPEARANCEUR Clear 07/11/2022 0904   LABSPEC 1.009 07/14/2022 1449   PHURINE 7.0 07/14/2022 1449   GLUCOSEU NEGATIVE 07/14/2022 1449   HGBUR SMALL (A) 07/14/2022 1449   BILIRUBINUR NEGATIVE 07/14/2022 1449   BILIRUBINUR Negative 07/11/2022 0904   KETONESUR NEGATIVE 07/14/2022 1449   PROTEINUR 30 (A) 07/14/2022 1449   UROBILINOGEN 0.2 08/02/2013 1512   NITRITE POSITIVE (A) 07/14/2022 1449   LEUKOCYTESUR NEGATIVE 07/14/2022 1449    Lab Results  Component Value Date   LABMICR See below: 07/11/2022   WBCUA 0-5 07/11/2022   LABEPIT None seen 07/11/2022   MUCUS Present (A) 07/11/2022   BACTERIA NONE SEEN 07/14/2022    Pertinent Imaging: KUb today: Images reviewed and discussed with the patient  No results found for this or any previous visit.  No results found for this or any previous visit.  No results found for this or any previous visit.  No results found for this or any previous visit.  No results found for this or any previous visit.  No valid procedures specified. No results found for this or any previous visit.  No results found for this or any previous visit.   Assessment &  Plan:    1. Kidney stones -We discussed the management of kidney stones. These options include observation, ureteroscopy, shockwave lithotripsy (ESWL) and percutaneous nephrolithotomy (PCNL). We discussed which options are relevant to the patient's stone(s). We discussed the natural history of kidney stones as well as the complications of untreated stones and the impact on quality of life without treatment as well as with each of the above listed treatments. We also discussed the efficacy of each treatment in its ability  to clear the stone burden. With any of these management options I discussed the signs and symptoms of infection and the need for emergent treatment should these be experienced. For each option we discussed the ability of each procedure to clear the patient of their stone burden.   For observation I described the risks which include but are not limited to silent renal damage, life-threatening infection, need for emergent surgery, failure to pass stone and pain.   For ureteroscopy I described the risks which include bleeding, infection, damage to contiguous structures, positioning injury, ureteral stricture, ureteral avulsion, ureteral injury, need for prolonged ureteral stent, inability to perform ureteroscopy, need for an interval procedure, inability to clear stone burden, stent discomfort/pain, heart attack, stroke, pulmonary embolus and the inherent risks with general anesthesia.   For shockwave lithotripsy I described the risks which include arrhythmia, kidney contusion, kidney hemorrhage, need for transfusion, pain, inability to adequately break up stone, inability to pass stone fragments, Steinstrasse, infection associated with obstructing stones, need for alternate surgical procedure, need for repeat shockwave lithotripsy, MI, CVA, PE and the inherent risks with anesthesia/conscious sedation.   For PCNL I described the risks including positioning injury, pneumothorax, hydrothorax,  need for chest tube, inability to clear stone burden, renal laceration, arterial venous fistula or malformation, need for embolization of kidney, loss of kidney or renal function, need for repeat procedure, need for prolonged nephrostomy tube, ureteral avulsion, MI, CVA, PE and the inherent risks of general anesthesia.   - The patient would like to proceed with medical expulsive therapy. Followup 2 weeks with KUB - Urinalysis, Routine w reflex microscopic   No follow-ups on file.  Wilkie Aye, MD  [facility] [place]

## 2022-07-16 LAB — URINE CULTURE: Culture: NO GROWTH

## 2022-07-18 ENCOUNTER — Telehealth: Payer: Self-pay

## 2022-07-18 NOTE — Telephone Encounter (Signed)
Patient called stating she does not want to wait the 2 weeks to see if her kidney stone would pass and request to go ahead with lithotripsy.  Please advise.

## 2022-07-18 NOTE — Telephone Encounter (Signed)
FYI you have the surgery case after your 2 lithos on 07/23 from [phone]

## 2022-07-20 ENCOUNTER — Telehealth: Payer: Self-pay

## 2022-07-20 NOTE — Telephone Encounter (Signed)
Transition Care Management Unsuccessful Follow-up Telephone Call  Date of discharge and from where:  [name] 7/11  Attempts:  1st Attempt  Reason for unsuccessful TCM follow-up call:  No answer/busy   Lenard Forth [facility] Guide, [facility] [phone] [address] [place], [place], [place] [address] Phone: [phone] Email: [name]

## 2022-07-20 NOTE — Telephone Encounter (Signed)
Please provide posting sheet for 07/23

## 2022-07-20 NOTE — Telephone Encounter (Signed)
Transition Care Management Follow-up Telephone Call Date of discharge and from where: [name] 7/11 How have you been since you were released from the hospital? Doing ok  Any questions or concerns? No  Items Reviewed: Did the pt receive and understand the discharge instructions provided? Yes  Medications obtained and verified? Yes  Other? No  Any new allergies since your discharge? No  Dietary orders reviewed? No Do you have support at home? Yes     Follow up appointments reviewed:  PCP Hospital f/u appt confirmed? Yes  Scheduled to see  on  @ . Specialist Hospital f/u appt confirmed? Yes  Scheduled to see  on 7/12 @ . Are transportation arrangements needed? Yes  If their condition worsens, is the pt aware to call PCP or go to the Emergency Dept.? Yes Was the patient provided with contact information for the PCP's office or ED? Yes Was to pt encouraged to call back with questions or concerns? Yes

## 2022-07-22 ENCOUNTER — Other Ambulatory Visit: Payer: Self-pay | Admitting: Urology

## 2022-07-22 ENCOUNTER — Other Ambulatory Visit: Payer: Self-pay

## 2022-07-22 DIAGNOSIS — N200 Calculus of kidney: Secondary | ICD-10-CM

## 2022-07-22 NOTE — Telephone Encounter (Signed)
I spoke with Otho Bellows. We have discussed possible surgery dates and 07/26/2022 was agreed upon by all parties. Patient given information about surgery date, what to expect pre-operatively and post operatively.    We discussed that a pre-op nurse will be calling to set up the pre-op visit that will take place prior to surgery. Informed patient that our office will communicate any additional care to be provided after surgery.    Patients questions or concerns were discussed during our call. Advised to call our office should there be any additional information, questions or concerns that arise. Patient verbalized understanding.

## 2022-07-22 NOTE — Telephone Encounter (Signed)
Patient states she discussed lithotripsy with you and she would like to proceed for 07/23.  Patient takes 81 mg asa per PCP.  Can you please fill surgery posting.

## 2022-07-25 ENCOUNTER — Encounter (HOSPITAL_COMMUNITY)
Admission: RE | Admit: 2022-07-25 | Discharge: 2022-07-25 | Disposition: A | Discharge status: Home / Self Care | Payer: PPO | Source: Ambulatory Visit | Attending: Urology | Admitting: Urology

## 2022-07-25 ENCOUNTER — Other Ambulatory Visit: Payer: Self-pay

## 2022-07-25 ENCOUNTER — Encounter (HOSPITAL_COMMUNITY): Payer: Self-pay

## 2022-07-25 ENCOUNTER — Ambulatory Visit (HOSPITAL_COMMUNITY)
Admission: RE | Admit: 2022-07-25 | Discharge: 2022-07-25 | Disposition: A | Discharge status: Home / Self Care | Payer: PPO | Source: Ambulatory Visit | Attending: Urology | Admitting: Urology

## 2022-07-25 DIAGNOSIS — N200 Calculus of kidney: Secondary | ICD-10-CM | POA: Diagnosis not present

## 2022-07-25 DIAGNOSIS — N201 Calculus of ureter: Secondary | ICD-10-CM | POA: Diagnosis not present

## 2022-07-25 NOTE — Telephone Encounter (Signed)
Patient came in office for litho packet this morning and states she thinks she passed her stone.  Dr. Ronne Binning ordered a KUB and informed patient she had passed her stone, she can cancel her litho for 07/23.  Patient aware and 2 week follow up scheduled with Maralyn Sago.

## 2022-07-26 ENCOUNTER — Encounter: Payer: Self-pay | Admitting: Urology

## 2022-07-26 ENCOUNTER — Ambulatory Visit (HOSPITAL_COMMUNITY): Admit: 2022-07-26 | Payer: PPO | Admitting: Urology

## 2022-07-26 ENCOUNTER — Encounter (HOSPITAL_COMMUNITY): Payer: Self-pay

## 2022-07-26 DIAGNOSIS — N200 Calculus of kidney: Secondary | ICD-10-CM

## 2022-07-26 SURGERY — EXTRACORPOREAL SHOCK WAVE LITHOTRIPSY (ESWL)
Anesthesia: LOCAL | Laterality: Left

## 2022-07-26 NOTE — Patient Instructions (Signed)

## 2022-08-04 ENCOUNTER — Ambulatory Visit: Payer: PPO | Admitting: Urology

## 2022-08-19 ENCOUNTER — Ambulatory Visit (INDEPENDENT_AMBULATORY_CARE_PROVIDER_SITE_OTHER): Payer: PPO | Admitting: Urology

## 2022-08-19 ENCOUNTER — Ambulatory Visit (HOSPITAL_COMMUNITY)
Admission: RE | Admit: 2022-08-19 | Discharge: 2022-08-19 | Disposition: A | Discharge status: Home / Self Care | Payer: PPO | Source: Ambulatory Visit | Attending: Urology | Admitting: Urology

## 2022-08-19 ENCOUNTER — Encounter: Payer: Self-pay | Admitting: Urology

## 2022-08-19 VITALS — BP 127/81 | HR 64 | Temp 97.8°F

## 2022-08-19 DIAGNOSIS — N200 Calculus of kidney: Secondary | ICD-10-CM

## 2022-08-19 DIAGNOSIS — Z87442 Personal history of urinary calculi: Secondary | ICD-10-CM | POA: Diagnosis not present

## 2022-08-19 DIAGNOSIS — Z09 Encounter for follow-up examination after completed treatment for conditions other than malignant neoplasm: Secondary | ICD-10-CM | POA: Diagnosis not present

## 2022-08-19 LAB — URINALYSIS, ROUTINE W REFLEX MICROSCOPIC
Bilirubin, UA: NEGATIVE
Glucose, UA: NEGATIVE
Ketones, UA: NEGATIVE
Leukocytes,UA: NEGATIVE
Nitrite, UA: NEGATIVE
Specific Gravity, UA: 1.03 (ref 1.005–1.030)
Urobilinogen, Ur: 0.2 mg/dL (ref 0.2–1.0)
pH, UA: 6 (ref 5.0–7.5)

## 2022-08-19 LAB — MICROSCOPIC EXAMINATION
Bacteria, UA: NONE SEEN
WBC, UA: NONE SEEN /hpf (ref 0–5)

## 2022-08-19 NOTE — Progress Notes (Signed)
Name: [name] DOB: Sep 10, 1950 MRN: [id number]  History of Present Illness: Ms. [name] is a 72 y.o. female who presents today for follow up visit at [facility] [place]. - GU History: 1. Kidney stones.  At last visit with Dr. Ronne Binning on 07/15/2022: - 07/14/2022: CT showed a 6mm left distal ureteral calculus. This is her second stone event.  - 07/15/2022: KUB showed left distal ureteral calculus.  - Patient elected to do medical expulsive therapy and follow up in 2 weeks with KUB.  Since last visit: - 07/22/2022: Patient called to request ESWL; scheduled for 07/26/2022. - 07/25/2022: Patient reported suspected stone passage. KUB showed "The previous distal left ureteral stone is not definitively seen on the current exam." ESWL canceled.  Today: She denies acute flank pain / abdominal pain. She denies fevers.  She denies increased urinary urgency, frequency, nocturia, dysuria, gross hematuria, hesitancy, straining to void, or sensations of incomplete emptying.   Fall Screening: Do you usually have a device to assist in your mobility? No   Medications: Current Outpatient Medications  Medication Sig Dispense Refill   ALPRAZolam (XANAX) 0.25 MG tablet Take 1 tablet (0.25 mg total) by mouth at bedtime as needed. 30 tablet 5   aspirin 81 MG chewable tablet Chew by mouth daily.     atorvastatin (LIPITOR) 20 MG tablet TAKE 1 TABLET BY MOUTH DAILY AT 6PM 90 tablet 1   BIOTIN PO Take by mouth.     CALCIUM-MAGNESIUM PO Take by mouth.     cephALEXin (KEFLEX) 500 MG capsule Take 1 capsule (500 mg total) by mouth 2 (two) times daily. 14 capsule 0   Cranberry 400 MG CAPS Take by mouth.     doxycycline (VIBRA-TABS) 100 MG tablet Take 1 tablet (100 mg total) by mouth 2 (two) times daily. 1 po bid 20 tablet 0   HYDROcodone-acetaminophen (NORCO) 5-325 MG tablet Take 1 tablet by mouth every 6 (six) hours as needed for moderate pain. 30 tablet 0   LUMIGAN 0.01 % SOLN      metoprolol  succinate (TOPROL-XL) 50 MG 24 hr tablet Take 1 tablet (50 mg total) by mouth daily. with food 90 tablet 1   pantoprazole (PROTONIX) 40 MG tablet Take 1 tablet (40 mg total) by mouth daily. 90 tablet 1   pyridOXINE (VITAMIN B-6) 100 MG tablet Take 100 mg by mouth daily.     tamsulosin (FLOMAX) 0.4 MG CAPS capsule Take 1 capsule (0.4 mg total) by mouth daily after supper. 30 capsule 0   No current facility-administered medications for this visit.    Allergies: Allergies  Allergen Reactions   Codeine Other (See Comments)    GI upset   Sulfonamide Derivatives     REACTION: severe GI upset    Past Medical History:  Diagnosis Date   Allergic rhinitis    Atypical mole 05/10/2005   mod-severe- right outer central back   Atypical mole 07/20/2011   moderate- back of right knee   Atypical mole 10/22/2013   mild- left anterior thigh sup   Cerumen impaction    right    Chest discomfort    Chronic constipation    Diverticulosis    GERD (gastroesophageal reflux disease)    Glaucoma    Hyperlipidemia    Kidney stones    Nasal congestion    Osteopenia 03/2018   T score -2.0 FRAX 12% / 2.2% stable from prior DEXA   Palpitation    PUD (peptic ulcer disease)  Scoliosis    Sinusitis    Vitamin D deficiency    Past Surgical History:  Procedure Laterality Date   CARDIAC CATHETERIZATION     COLPOSCOPY     excision of mole  2007   GYNECOLOGIC CRYOSURGERY     LAPAROSCOPIC CHOLECYSTECTOMY     TUBAL LIGATION  1991   Family History  Problem Relation Age of Onset   Hypertension Mother    Heart disease Mother    Hyperlipidemia Mother    Glaucoma Mother    Hypertension Father    Cancer Father        LUNG   Hyperlipidemia Father    Diabetes Sister    Hypertension Sister    Hyperlipidemia Sister    Fibromyalgia Daughter    Asthma Daughter    Social History   Socioeconomic History   Marital status: Married    Spouse name: Eddie   Number of children: 1   Years of education:  14   Highest education level: Associate degree: occupational, Scientist, product/process development, or vocational program  Occupational History   Occupation: Retired    Associate Professor: [employer]  Tobacco Use   Smoking status: Never   Smokeless tobacco: Never  Vaping Use   Vaping status: Never Used  Substance and Sexual Activity   Alcohol use: Yes    Alcohol/week: 0.0 standard drinks of alcohol    Comment: Rare   Drug use: No   Sexual activity: Yes    Birth control/protection: Post-menopausal, Surgical    Comment: Tubal lig-1st intercourse 72 yo-Fewer than 5 partners  Other Topics Concern   Not on file  Social History Narrative   Not on file   Social Determinants of Health   Financial Resource Strain: Low Risk  (02/14/2022)   Overall Financial Resource Strain (CARDIA)    Difficulty of Paying Living Expenses: Not hard at all  Food Insecurity: No Food Insecurity (02/14/2022)   Hunger Vital Sign    Worried About Running Out of Food in the Last Year: Never true    Ran Out of Food in the Last Year: Never true  Transportation Needs: No Transportation Needs (02/14/2022)   PRAPARE - Administrator, Civil Service (Medical): No    Lack of Transportation (Non-Medical): No  Physical Activity: Insufficiently Active (11/23/2020)   Exercise Vital Sign    Days of Exercise per Week: 7 days    Minutes of Exercise per Session: 10 min  Stress: No Stress Concern Present (02/14/2022)   [employer] of Occupational Health - Occupational Stress Questionnaire    Feeling of Stress : Not at all  Social Connections: Socially Integrated (02/14/2022)   Social Connection and Isolation Panel [NHANES]    Frequency of Communication with Friends and Family: More than three times a week    Frequency of Social Gatherings with Friends and Family: More than three times a week    Attends Religious Services: More than 4 times per year    Active Member of [employer] or Organizations: Yes    Attends Engineer, structural: More  than 4 times per year    Marital Status: Married  Catering manager Violence: Not At Risk (02/14/2022)   Humiliation, Afraid, Rape, and Kick questionnaire    Fear of Current or Ex-Partner: No    Emotionally Abused: No    Physically Abused: No    Sexually Abused: No    SUBJECTIVE  Review of Systems Constitutional: Patient denies any unintentional weight loss or change in strength lntegumentary: Patient  denies any rashes or pruritus Cardiovascular: Patient denies chest pain or syncope Respiratory: Patient denies shortness of breath Gastrointestinal: Patient denies nausea, vomiting, constipation, or diarrhea Musculoskeletal: Patient denies muscle cramps or weakness Neurologic: Patient denies convulsions or seizures Psychiatric: Patient denies memory problems Allergic/Immunologic: Patient denies recent allergic reaction(s) Hematologic/Lymphatic: Patient denies bleeding tendencies Endocrine: Patient denies heat/cold intolerance  GU: As per HPI.  OBJECTIVE Vitals:   08/19/22 0915  BP: 127/81  Pulse: 64  Temp: 97.8 F (36.6 C)   There is no height or weight on file to calculate BMI.  Physical Examination  Constitutional: No obvious distress; patient is non-toxic appearing  Cardiovascular: No visible lower extremity edema.  Respiratory: The patient does not have audible wheezing/stridor; respirations do not appear labored  Gastrointestinal: Abdomen non-distended Musculoskeletal: Normal ROM of UEs  Skin: No obvious rashes/open sores  Neurologic: CN 2-12 grossly intact Psychiatric: Answered questions appropriately with normal affect  Hematologic/Lymphatic/Immunologic: No obvious bruises or sites of spontaneous bleeding  UA: positive for 3-10 RBC/hpf  ASSESSMENT Kidney stones - Plan: Urinalysis, Routine w reflex microscopic, DG Abd 1 View  Nephrolithiasis - Plan: Urinalysis, Routine w reflex microscopic, DG Abd 1 View  We reviewed recent imaging results; no acute  findings.  For stone prevention: Advised adequate hydration and we discussed option to consider low oxalate diet given that calcium oxalate is the most common type of stone. Handout provided about stone prevention diet.  Will plan to follow up in 6 months with KUB for stone surveillance or sooner if needed. Pt verbalized understanding and agreement. All questions were answered.  PLAN Advised the following: Maintain adequate fluid intake. Low oxalate diet. Return in about 6 months (around 02/19/2023) for KUB, UA, & f/u with Evette Georges NP.  Orders Placed This Encounter  Procedures   DG Abd 1 View    Standing Status:   Future    Standing Expiration Date:   08/19/2023    Order Specific Question:   Reason for Exam (SYMPTOM  OR DIAGNOSIS REQUIRED)    Answer:   kidney stone    Order Specific Question:   Preferred imaging location?    Answer:   [facility]   Urinalysis, Routine w reflex microscopic    It has been explained that the patient is to follow regularly with their PCP in addition to all other providers involved in their care and to follow instructions provided by these respective offices. Patient advised to contact urology clinic if any urologic-pertaining questions, concerns, new symptoms or problems arise in the interim period.  Patient Instructions  >80% of stones are calcium oxalate. This type of stones forms when body either isn't clearing oxalate well enough, is making too much oxalate, or too little citrate. This results in oxalate binding to form crystals, which continue to aggregate and form stones.  Limiting calcium does not help, but limiting oxalate in the diet can help. Increasing citric acid intake may also help.  The following measures may help to prevent the recurrence of stones: Increase water intake to 2-2.5 liters per day May add citrus juice (lemon, lime or orange juice) to water Moderation in dairy foods Decrease in salt content Low Oxalate diet:  Oxylates are found in foods like Tomato, Spinach, red wine and chocolate (see additional resources below).  Internet resources for information regarding low oxalate diet:  https://kidneystones.yangchunwu.com https://my.VerticalStretch.be  Foods Low in Sodium or Oxalate Foods You Can Eat  Drinks Coffee, fruit and veggie juice (using the recommended veggies), fruit punch  Fruits Apples, apricots (fresh or canned), avocado, bananas, cherries (sweet), cranberries, grapefruit, red or green grapes, lemon and lime juice, melons, nectarines, papayas, peaches, pears, pineapples, oranges, strawberries (fresh), tangerines  Veggies Artichokes, asparagus, bamboo shoots, broccoli, brussels sprouts, cabbage, cauliflower, chayote squash, chicory, corn, cucumbers, endive, lettuce, lima beans, mushrooms, onions, peas, peppers, potatoes, radishes, rutabagas, zucchini  Breads, Cereals, Grains Egg noodles, rye bread, cooked and dry cereals without nuts or bran, crackers with unsalted tops, white or wild rice  Meat, Meat Replacements, Fish, Recruitment consultant, fish, poultry, eggs, egg whites, egg replacements  Soup Homemade soup (using the recommended veggies and meat), low-sodium bouillon, low-sodium canned  Desserts Cookies, cakes, ice cream, pudding without chocolate or nuts, candy without chocolate or nuts  Fats and Oils Butter, margarine, cream, oil, salad dressing, mayo  Other Foods Unsalted potato chips or pretzels, herbs (like garlic, garlic powder, onion powder), lemon juice, salt-free seasoning blends, vinegar  Other Foods Low in Oxalate Foods You Can Eat  Drinks Beer, cola, wine, buttermilk, lemonade or limeade (without added vitamin C), milk  Meat, Meat Replacements, Fish, [employer] meat, ham, bacon, hot dogs, bratwurst, sausage, chicken nuggets, cheddar cheese, canned fish and shellfish  Soup Tomato soup, cheese  soup  Other Foods Coconuts, lemon or lime juices, sugar or sweeteners, jellies or jams (from the recommended list)   Moderate-Oxalate Foods Foods to Limit   Drinks Fruit and veggie juices (from the list below), chocolate milk, rice milk, hot cocoa, tea   Fruits Blackberries, blueberries, black currants, cherries (sour), fruit cocktail, mangoes, orange peel, prunes, purple plums   Veggies Baked beans, carrots, celery, green beans, parsnips, summer squash, tomatoes, turnips   Breads, Cereals, Grains White bread, cornbread or cornmeal, white English muffins, saltine or soda crackers, brown rice, vanilla wafers, spaghetti and other noodles, firm tofu, bagels, oatmeal   Meat/meat replacements, fish, poultry Sardines   Desserts Chocolate cake   Fats and Oils Macadamia nuts, pistachio nuts, English walnuts   Other Foods Jams or jellies (made with the fruits above), pepper   High-Oxalate Foods Foods to Avoid Drinks Chocolate drink mixes, soy milk, Ovaltine, instant iced tea, fruit juices of fruits listed below Fruits Apricots (dried), red currants, figs, kiwi, plums, rhubarb Veggies Beans (wax, dried), beets and beet greens, chives, collard greens, eggplant, escarole, dark greens of all kinds, leeks, okra, parsley, rutabagas, spinach, Swiss chard, tomato paste, watercress Breads, Cereals, Grains Amaranth, barley, white corn flour, fried potatoes, fruitcake, grits, soybean products, sweet potatoes, wheat germ and bran, buckwheat flour, All Bran cereal, graham crackers, pretzels, whole wheat bread Meat/meat replacements, fish, poultry Dried beans, peanut butter, soy burgers, miso Desserts Carob, chocolate, marmalades Fats and Oils Nuts (peanuts, almonds, pecans, cashews, hazelnuts), nut butters, sesame seeds, tahini paste Other Foods Poppy seeds   Electronically signed by:  Donnita Falls, MSN, FNP-C, CUNP 08/19/2022 9:48 AM

## 2022-08-19 NOTE — Patient Instructions (Signed)

## 2022-09-28 ENCOUNTER — Other Ambulatory Visit: Payer: Self-pay | Admitting: Pediatrics

## 2022-09-28 DIAGNOSIS — Z1231 Encounter for screening mammogram for malignant neoplasm of breast: Secondary | ICD-10-CM

## 2022-10-17 ENCOUNTER — Ambulatory Visit: Payer: PPO

## 2022-10-18 ENCOUNTER — Ambulatory Visit (INDEPENDENT_AMBULATORY_CARE_PROVIDER_SITE_OTHER): Payer: PPO

## 2022-10-18 DIAGNOSIS — Z23 Encounter for immunization: Secondary | ICD-10-CM

## 2022-11-28 DIAGNOSIS — H401131 Primary open-angle glaucoma, bilateral, mild stage: Secondary | ICD-10-CM | POA: Diagnosis not present

## 2022-12-19 ENCOUNTER — Ambulatory Visit (INDEPENDENT_AMBULATORY_CARE_PROVIDER_SITE_OTHER): Payer: PPO | Admitting: Nurse Practitioner

## 2022-12-19 VITALS — BP 124/84 | HR 59 | Temp 97.0°F | Ht <= 58 in | Wt 121.0 lb

## 2022-12-19 DIAGNOSIS — E785 Hyperlipidemia, unspecified: Secondary | ICD-10-CM | POA: Diagnosis not present

## 2022-12-19 DIAGNOSIS — F411 Generalized anxiety disorder: Secondary | ICD-10-CM | POA: Diagnosis not present

## 2022-12-19 DIAGNOSIS — E559 Vitamin D deficiency, unspecified: Secondary | ICD-10-CM | POA: Diagnosis not present

## 2022-12-19 DIAGNOSIS — K449 Diaphragmatic hernia without obstruction or gangrene: Secondary | ICD-10-CM | POA: Diagnosis not present

## 2022-12-19 DIAGNOSIS — I10 Essential (primary) hypertension: Secondary | ICD-10-CM

## 2022-12-19 DIAGNOSIS — K219 Gastro-esophageal reflux disease without esophagitis: Secondary | ICD-10-CM | POA: Diagnosis not present

## 2022-12-19 DIAGNOSIS — M8588 Other specified disorders of bone density and structure, other site: Secondary | ICD-10-CM | POA: Diagnosis not present

## 2022-12-19 LAB — LIPID PANEL

## 2022-12-19 MED ORDER — ALPRAZOLAM 0.25 MG PO TABS
0.2500 mg | ORAL_TABLET | Freq: Every evening | ORAL | 5 refills | Status: AC | PRN
Start: 2022-12-19 — End: ?

## 2022-12-19 MED ORDER — METOPROLOL SUCCINATE ER 50 MG PO TB24
50.0000 mg | ORAL_TABLET | Freq: Every day | ORAL | 1 refills | Status: AC
Start: 2022-12-19 — End: ?

## 2022-12-19 MED ORDER — PANTOPRAZOLE SODIUM 40 MG PO TBEC
40.0000 mg | DELAYED_RELEASE_TABLET | Freq: Every day | ORAL | 1 refills | Status: AC
Start: 2022-12-19 — End: ?

## 2022-12-19 MED ORDER — ATORVASTATIN CALCIUM 20 MG PO TABS
ORAL_TABLET | ORAL | 1 refills | Status: AC
Start: 2022-12-19 — End: ?

## 2022-12-19 NOTE — Progress Notes (Signed)
Subjective:    Patient ID: [name], female    DOB: 16-Oct-1950, 72 y.o.   MRN: [id number]   Chief Complaint: medical management of chronic issues     HPI:  [name] is a 72 y.o. who identifies as a female who was assigned female at birth.   Social history: Lives with: husband Work history: retired   Water engineer in today for follow up of the following chronic medical issues:  1. Essential hypertension No c/o chest pain, sob or headache. Does not check blood pressure at home. BP Readings from Last 3 Encounters:  08/19/22 127/81  07/15/22 126/71  07/14/22 (!) 141/74     2. Hyperlipidemia with target LDL less than 100 Does watch diet and stays very active walks for exercise several times a week. Lab Results  Component Value Date   CHOL 177 07/04/2022   HDL 48 07/04/2022   LDLCALC 91 07/04/2022   TRIG 227 (H) 07/04/2022   CHOLHDL 3.7 07/04/2022    3. Diaphragmatic hernia without obstruction and without gangrene Has no issues  4. Gastroesophageal reflux disease without esophagitis Is on protonix and is doing well.  5. Osteopenia of lumbar spine Does weight bearing exercise daily. Last dexascan was done on 07/04/22  6. Vitamin D deficiency Is on daily vitamin d supplement   New complaints: None today  Allergies  Allergen Reactions   Codeine Other (See Comments)    GI upset   Sulfonamide Derivatives     REACTION: severe GI upset   Outpatient Encounter Medications as of 12/19/2022  Medication Sig   ALPRAZolam (XANAX) 0.25 MG tablet Take 1 tablet (0.25 mg total) by mouth at bedtime as needed.   aspirin 81 MG chewable tablet Chew by mouth daily.   atorvastatin (LIPITOR) 20 MG tablet TAKE 1 TABLET BY MOUTH DAILY AT 6PM   BIOTIN PO Take by mouth.   CALCIUM-MAGNESIUM PO Take by mouth.   cephALEXin (KEFLEX) 500 MG capsule Take 1 capsule (500 mg total) by mouth 2 (two) times daily.   Cranberry 400 MG CAPS Take by mouth.   doxycycline (VIBRA-TABS) 100 MG  tablet Take 1 tablet (100 mg total) by mouth 2 (two) times daily. 1 po bid   HYDROcodone-acetaminophen (NORCO) 5-325 MG tablet Take 1 tablet by mouth every 6 (six) hours as needed for moderate pain.   LUMIGAN 0.01 % SOLN    metoprolol succinate (TOPROL-XL) 50 MG 24 hr tablet Take 1 tablet (50 mg total) by mouth daily. with food   pantoprazole (PROTONIX) 40 MG tablet Take 1 tablet (40 mg total) by mouth daily.   pyridOXINE (VITAMIN B-6) 100 MG tablet Take 100 mg by mouth daily.   tamsulosin (FLOMAX) 0.4 MG CAPS capsule Take 1 capsule (0.4 mg total) by mouth daily after supper.   No facility-administered encounter medications on file as of 12/19/2022.    Past Surgical History:  Procedure Laterality Date   CARDIAC CATHETERIZATION     COLPOSCOPY     excision of mole  2007   GYNECOLOGIC CRYOSURGERY     LAPAROSCOPIC CHOLECYSTECTOMY     TUBAL LIGATION  1991    Family History  Problem Relation Age of Onset   Hypertension Mother    Heart disease Mother    Hyperlipidemia Mother    Glaucoma Mother    Hypertension Father    Cancer Father        LUNG   Hyperlipidemia Father    Diabetes Sister    Hypertension Sister  Hyperlipidemia Sister    Fibromyalgia Daughter    Asthma Daughter       Controlled substance contract: 07/12/22     Review of Systems  Constitutional:  Negative for diaphoresis.  Eyes:  Negative for pain.  Respiratory:  Negative for shortness of breath.   Cardiovascular:  Negative for chest pain, palpitations and leg swelling.  Gastrointestinal:  Negative for abdominal pain.  Endocrine: Negative for polydipsia.  Skin:  Negative for rash.  Neurological:  Negative for dizziness, weakness and headaches.  Hematological:  Does not bruise/bleed easily.  All other systems reviewed and are negative.      Objective:   Physical Exam Vitals and nursing note reviewed.  Constitutional:      General: She is not in acute distress.    Appearance: Normal appearance. She  is well-developed.  HENT:     Head: Normocephalic.     Right Ear: Tympanic membrane normal.     Left Ear: Tympanic membrane normal.     Nose: Nose normal.     Mouth/Throat:     Mouth: Mucous membranes are moist.  Eyes:     Pupils: Pupils are equal, round, and reactive to light.  Neck:     Vascular: No carotid bruit or JVD.  Cardiovascular:     Rate and Rhythm: Normal rate and regular rhythm.     Heart sounds: Normal heart sounds.  Pulmonary:     Effort: Pulmonary effort is normal. No respiratory distress.     Breath sounds: Normal breath sounds. No wheezing or rales.  Chest:     Chest wall: No tenderness.  Abdominal:     General: Bowel sounds are normal. There is no distension or abdominal bruit.     Palpations: Abdomen is soft. There is no hepatomegaly, splenomegaly, mass or pulsatile mass.     Tenderness: There is no abdominal tenderness.  Musculoskeletal:        General: Normal range of motion.     Cervical back: Normal range of motion and neck supple.  Lymphadenopathy:     Cervical: No cervical adenopathy.  Skin:    General: Skin is warm and dry.  Neurological:     Mental Status: She is alert and oriented to person, place, and time.     Deep Tendon Reflexes: Reflexes are normal and symmetric.  Psychiatric:        Behavior: Behavior normal.        Thought Content: Thought content normal.        Judgment: Judgment normal.    BP 124/84   Pulse (!) 59   Temp (!) 97 F (36.1 C) (Temporal)   Ht 4\' 10"  (1.473 m)   Wt 121 lb (54.9 kg)   SpO2 97%   BMI 25.29 kg/m         Assessment & Plan:   SHAELY PROPER comes in today with chief complaint of Medical Management of Chronic Issues   Diagnosis and orders addressed:  1. Essential hypertension (Primary) Low sodium diet - metoprolol succinate (TOPROL-XL) 50 MG 24 hr tablet; Take 1 tablet (50 mg total) by mouth daily. with food  Dispense: 90 tablet; Refill: 1 - CBC with Differential/Platelet - CMP14+EGFR  2.  Hyperlipidemia with target LDL less than 100 Lowfat diet - atorvastatin (LIPITOR) 20 MG tablet; TAKE 1 TABLET BY MOUTH DAILY AT 6PM  Dispense: 90 tablet; Refill: 1 - Lipid panel  3. Diaphragmatic hernia without obstruction and without gangrene  4. Gastroesophageal reflux disease without esophagitis  Avoid spicy foods Do not eat 2 hours prior to bedtime - pantoprazole (PROTONIX) 40 MG tablet; Take 1 tablet (40 mg total) by mouth daily.  Dispense: 90 tablet; Refill: 1  5. Osteopenia of lumbar spine Continue weight bearing exercises  6. Vitamin D deficiency   7. GAD (generalized anxiety disorder) Stress management - ALPRAZolam (XANAX) 0.25 MG tablet; Take 1 tablet (0.25 mg total) by mouth at bedtime as needed.  Dispense: 30 tablet; Refill: 5   Labs pending Health Maintenance reviewed Diet and exercise encouraged  Follow up plan: 6 months   Mary-Margaret Daphine Deutscher, FNP

## 2022-12-19 NOTE — Patient Instructions (Signed)

## 2022-12-20 LAB — LIPID PANEL
Cholesterol, Total: 179 mg/dL (ref 100–199)
HDL: 45 mg/dL (ref >39)
LDL CALC COMMENT:: 4 ratio (ref 0.0–4.4)
LDL Chol Calc (NIH): 101 mg/dL — ABNORMAL HIGH (ref 0–99)
Triglycerides: 191 mg/dL — ABNORMAL HIGH (ref 0–149)
VLDL Cholesterol Cal: 33 mg/dL (ref 5–40)

## 2022-12-20 LAB — CMP14+EGFR
ALT: 20 IU/L (ref 0–32)
AST: 22 IU/L (ref 0–40)
Albumin: 4.4 g/dL (ref 3.8–4.8)
Alkaline Phosphatase: 98 IU/L (ref 44–121)
BUN/Creatinine Ratio: 19 (ref 12–28)
BUN: 15 mg/dL (ref 8–27)
Bilirubin Total: 0.3 mg/dL (ref 0.0–1.2)
CO2: 23 mmol/L (ref 20–29)
Calcium: 9.9 mg/dL (ref 8.7–10.3)
Chloride: 102 mmol/L (ref 96–106)
Creatinine, Ser: 0.78 mg/dL (ref 0.57–1.00)
Globulin, Total: 2.5 g/dL (ref 1.5–4.5)
Glucose: 92 mg/dL (ref 70–99)
Potassium: 4.2 mmol/L (ref 3.5–5.2)
Sodium: 143 mmol/L (ref 134–144)
Total Protein: 6.9 g/dL (ref 6.0–8.5)
eGFR: 80 mL/min/{1.73_m2} (ref >59)

## 2022-12-20 LAB — CBC WITH DIFFERENTIAL/PLATELET
Basophils Absolute: 0 10*3/uL (ref 0.0–0.2)
Basos: 0 %
EOS (ABSOLUTE): 0.2 10*3/uL (ref 0.0–0.4)
Eos: 3 %
Hematocrit: 42.9 % (ref 34.0–46.6)
Hemoglobin: 13.7 g/dL (ref 11.1–15.9)
Immature Grans (Abs): 0 10*3/uL (ref 0.0–0.1)
Immature Granulocytes: 0 %
Lymphocytes Absolute: 2.3 10*3/uL (ref 0.7–3.1)
Lymphs: 39 %
MCH: 30.3 pg (ref 26.6–33.0)
MCHC: 31.9 g/dL (ref 31.5–35.7)
MCV: 95 fL (ref 79–97)
Monocytes Absolute: 0.5 10*3/uL (ref 0.1–0.9)
Monocytes: 8 %
Neutrophils Absolute: 2.9 10*3/uL (ref 1.4–7.0)
Neutrophils: 50 %
Platelets: 212 10*3/uL (ref 150–450)
RBC: 4.52 x10E6/uL (ref 3.77–5.28)
RDW: 13.6 % (ref 11.7–15.4)
WBC: 5.9 10*3/uL (ref 3.4–10.8)

## 2023-01-09 ENCOUNTER — Ambulatory Visit: Payer: PPO

## 2023-01-10 ENCOUNTER — Ambulatory Visit: Payer: PPO | Admitting: Nurse Practitioner

## 2023-01-12 ENCOUNTER — Ambulatory Visit
Admission: RE | Admit: 2023-01-12 | Discharge: 2023-01-12 | Disposition: A | Discharge status: Home / Self Care | Payer: PPO | Source: Ambulatory Visit | Attending: Nurse Practitioner | Admitting: Nurse Practitioner

## 2023-01-12 DIAGNOSIS — Z1231 Encounter for screening mammogram for malignant neoplasm of breast: Secondary | ICD-10-CM

## 2023-02-09 DIAGNOSIS — R051 Acute cough: Secondary | ICD-10-CM | POA: Diagnosis not present

## 2023-02-09 DIAGNOSIS — R070 Pain in throat: Secondary | ICD-10-CM | POA: Diagnosis not present

## 2023-02-09 DIAGNOSIS — Z20822 Contact with and (suspected) exposure to covid-19: Secondary | ICD-10-CM | POA: Diagnosis not present

## 2023-02-15 ENCOUNTER — Telehealth: Payer: Self-pay

## 2023-02-15 NOTE — Telephone Encounter (Signed)
Pt called to let us know her husband had covid and just wanted to verify that its okay to come to her appt on 02/17 advise Pt via mychart as long as she doesn't have a temp. She should come

## 2023-02-16 NOTE — Progress Notes (Signed)
Name: [name] DOB: Mar 27, 1950 MRN: [id number]  History of Present Illness: Ms. [name] is a 73 y.o. female who presents today for follow up visit at [name] [place]. - GU History: 1. Kidney stones. 2. Left renal cyst.   At last visit on 08/19/2022: Doing well. Negative KUB.  Today: KUB today: Awaiting radiology read; no stones appreciated per provider interpretation.  She denies recent stone passage. She denies flank pain or abdominal pain. She  increased urinary urgency, frequency, nocturia, dysuria, gross hematuria, hesitancy, straining to void, or sensations of incomplete emptying.   Fall Screening: Do you usually have a device to assist in your mobility? No   Medications: Current Outpatient Medications  Medication Sig Dispense Refill   ALPRAZolam (XANAX) 0.25 MG tablet Take 1 tablet (0.25 mg total) by mouth at bedtime as needed. 30 tablet 5   aspirin 81 MG chewable tablet Chew by mouth daily.     atorvastatin (LIPITOR) 20 MG tablet TAKE 1 TABLET BY MOUTH DAILY AT 6PM 90 tablet 1   BIOTIN PO Take by mouth.     CALCIUM-MAGNESIUM PO Take by mouth.     Cranberry 400 MG CAPS Take by mouth.     LUMIGAN 0.01 % SOLN      metoprolol succinate (TOPROL-XL) 50 MG 24 hr tablet Take 1 tablet (50 mg total) by mouth daily. with food 90 tablet 1   pantoprazole (PROTONIX) 40 MG tablet Take 1 tablet (40 mg total) by mouth daily. 90 tablet 1   pyridOXINE (VITAMIN B-6) 100 MG tablet Take 100 mg by mouth daily.     No current facility-administered medications for this visit.    Allergies: Allergies  Allergen Reactions   Codeine Other (See Comments)    GI upset   Sulfonamide Derivatives     REACTION: severe GI upset    Past Medical History:  Diagnosis Date   Allergic rhinitis    Atypical mole 05/10/2005   mod-severe- right outer central back   Atypical mole 07/20/2011   moderate- back of right knee   Atypical mole 10/22/2013   mild- left anterior thigh sup    Cerumen impaction    right    Chest discomfort    Chronic constipation    Diverticulosis    GERD (gastroesophageal reflux disease)    Glaucoma    Hyperlipidemia    Kidney stones    Nasal congestion    Osteopenia 03/2018   T score -2.0 FRAX 12% / 2.2% stable from prior DEXA   Palpitation    PUD (peptic ulcer disease)    Scoliosis    Sinusitis    Vitamin D deficiency    Past Surgical History:  Procedure Laterality Date   CARDIAC CATHETERIZATION     COLPOSCOPY     excision of mole  2007   GYNECOLOGIC CRYOSURGERY     LAPAROSCOPIC CHOLECYSTECTOMY     TUBAL LIGATION  1991   Family History  Problem Relation Age of Onset   Hypertension Mother    Heart disease Mother    Hyperlipidemia Mother    Glaucoma Mother    Hypertension Father    Cancer Father        LUNG   Hyperlipidemia Father    Diabetes Sister    Hypertension Sister    Hyperlipidemia Sister    Fibromyalgia Daughter    Asthma Daughter    Social History   Socioeconomic History   Marital status: Married    Spouse name: Link Snuffer  Number of children: 1   Years of education: 14   Highest education level: Associate degree: academic program  Occupational History   Occupation: Retired    Associate Professor: [employer]  Tobacco Use   Smoking status: Never   Smokeless tobacco: Never  Vaping Use   Vaping status: Never Used  Substance and Sexual Activity   Alcohol use: Yes    Alcohol/week: 0.0 standard drinks of alcohol    Comment: Rare   Drug use: No   Sexual activity: Yes    Birth control/protection: Post-menopausal, Surgical    Comment: Tubal lig-1st intercourse 73 yo-Fewer than 5 partners  Other Topics Concern   Not on file  Social History Narrative   Not on file   Social Drivers of Health   Financial Resource Strain: Low Risk  (12/19/2022)   Overall Financial Resource Strain (CARDIA)    Difficulty of Paying Living Expenses: Not hard at all  Food Insecurity: No Food Insecurity (02/17/2023)   Hunger Vital Sign     Worried About Running Out of Food in the Last Year: Never true    Ran Out of Food in the Last Year: Never true  Transportation Needs: No Transportation Needs (02/17/2023)   PRAPARE - Administrator, Civil Service (Medical): No    Lack of Transportation (Non-Medical): No  Physical Activity: Inactive (02/17/2023)   Exercise Vital Sign    Days of Exercise per Week: 0 days    Minutes of Exercise per Session: 0 min  Stress: No Stress Concern Present (02/17/2023)   [employer] of Occupational Health - Occupational Stress Questionnaire    Feeling of Stress : Only a little  Social Connections: Socially Integrated (02/17/2023)   Social Connection and Isolation Panel [NHANES]    Frequency of Communication with Friends and Family: More than three times a week    Frequency of Social Gatherings with Friends and Family: Once a week    Attends Religious Services: More than 4 times per year    Active Member of [employer] or Organizations: Yes    Attends Engineer, structural: More than 4 times per year    Marital Status: Married  Catering manager Violence: Not At Risk (02/17/2023)   Humiliation, Afraid, Rape, and Kick questionnaire    Fear of Current or Ex-Partner: No    Emotionally Abused: No    Physically Abused: No    Sexually Abused: No    SUBJECTIVE  Review of Systems Constitutional: Patient denies any unintentional weight loss or change in strength lntegumentary: Patient denies any rashes or pruritus Cardiovascular: Patient denies chest pain or syncope Respiratory: Patient denies shortness of breath Gastrointestinal: Patient denies nausea, vomiting, constipation, or diarrhea Musculoskeletal: Patient denies muscle cramps or weakness Neurologic: Patient denies convulsions or seizures Allergic/Immunologic: Patient denies recent allergic reaction(s) Hematologic/Lymphatic: Patient denies bleeding tendencies Endocrine: Patient denies heat/cold intolerance  GU: As per  HPI.  OBJECTIVE Vitals:   02/20/23 1110  BP: (!) 149/82  Pulse: 65  Temp: 97.7 F (36.5 C)   There is no height or weight on file to calculate BMI.  Physical Examination Constitutional: No obvious distress; patient is non-toxic appearing  Cardiovascular: No visible lower extremity edema.  Respiratory: The patient does not have audible wheezing/stridor; respirations do not appear labored  Gastrointestinal: Abdomen non-distended Musculoskeletal: Normal ROM of UEs  Skin: No obvious rashes/open sores  Neurologic: CN 2-12 grossly intact Psychiatric: Answered questions appropriately with normal affect  Hematologic/Lymphatic/Immunologic: No obvious bruises or sites of spontaneous bleeding  Urine microscopy: 3-10 RBC/hpf, otherwise unremarkable  ASSESSMENT Kidney stones - Plan: Urinalysis, Routine w reflex microscopic, DG Abd 1 View, US RENAL  Microscopic hematuria - Plan: DG Abd 1 View, US RENAL  We reviewed recent imaging results; awaiting radiology results, appears to have no acute findings per provider interpretation.  We discussed evidence of microscopic hematuria today and agreed to obtain renal/bladder US for further evaluation in the near future.   For stone prevention: Advised adequate hydration and we discussed option to consider low oxalate diet given that calcium oxalate is the most common type of stone. Handout provided about stone prevention diet.  Will plan to follow up in 6 months with KUB for stone surveillance or sooner if needed. Patient verbalized understanding of and agreement with current plan. All questions were answered.  PLAN Advised the following: Renal/bladder US within 1-2 weeks.  Maintain adequate fluid intake daily. Return in about 6 months (around 08/20/2023) for KUB, UA, & f/u with Evette Georges NP.  Orders Placed This Encounter  Procedures   DG Abd 1 View    Standing Status:   Future    Expected Date:   08/20/2023    Expiration Date:   02/20/2024     Reason for Exam (SYMPTOM  OR DIAGNOSIS REQUIRED):   kidney stone    Preferred imaging location?:   [facility]   US RENAL    Standing Status:   Future    Expected Date:   02/20/2023    Expiration Date:   02/20/2024    Reason for Exam (SYMPTOM  OR DIAGNOSIS REQUIRED):   microscopic hematuria and kidney stones    Preferred imaging location?:   [facility]   Urinalysis, Routine w reflex microscopic    It has been explained that the patient is to follow regularly with their PCP in addition to all other providers involved in their care and to follow instructions provided by these respective offices. Patient advised to contact urology clinic if any urologic-pertaining questions, concerns, new symptoms or problems arise in the interim period.  There are no Patient Instructions on file for this visit.  Electronically signed by:  Donnita Falls, MSN, FNP-C, CUNP 02/20/2023 12:02 PM

## 2023-02-17 ENCOUNTER — Ambulatory Visit: Payer: PPO

## 2023-02-17 VITALS — Ht <= 58 in | Wt 121.0 lb

## 2023-02-17 DIAGNOSIS — Z0000 Encounter for general adult medical examination without abnormal findings: Secondary | ICD-10-CM

## 2023-02-17 NOTE — Patient Instructions (Signed)
Ms. Heinkel , Thank you for taking time to come for your Medicare Wellness Visit. I appreciate your ongoing commitment to your health goals. Please review the following plan we discussed and let me know if I can assist you in the future.   Referrals/Orders/Follow-Ups/Clinician Recommendations: Aim for 30 minutes of exercise or brisk walking, 6-8 glasses of water, and 5 servings of fruits and vegetables each day.  This is a list of the screening recommended for you and due dates:  Health Maintenance  Topic Date Due   COVID-19 Vaccine (4 - 2024-25 season) 09/04/2022   Colon Cancer Screening  07/04/2023*   Cologuard (Stool DNA test)  07/17/2023   Mammogram  01/12/2024   Medicare Annual Wellness Visit  02/17/2024   DEXA scan (bone density measurement)  07/05/2024   DTaP/Tdap/Td vaccine (3 - Td or Tdap) 06/30/2031   Pneumonia Vaccine  Completed   Flu Shot  Completed   Hepatitis C Screening  Completed   Zoster (Shingles) Vaccine  Completed   HPV Vaccine  Aged Out  *Topic was postponed. The date shown is not the original due date.    Advanced directives: (ACP Link)Information on Advanced Care Planning can be found at [facility] [place] Advance Health Care Directives Advance Health Care Directives ([email or web address]   Next Medicare Annual Wellness Visit scheduled for next year: Yes

## 2023-02-17 NOTE — Progress Notes (Signed)
Subjective:   [name] is a 73 y.o. female who presents for Medicare Annual (Subsequent) preventive examination.  Visit Complete: Virtual I connected with  Oran Rein on 02/17/23 by a audio enabled telemedicine application and verified that I am speaking with the correct person using two identifiers.  Patient Location: Home  Provider Location: Home Office  I discussed the limitations of evaluation and management by telemedicine. The patient expressed understanding and agreed to proceed.  Vital Signs: Because this visit was a virtual/telehealth visit, some criteria may be missing or patient reported. Any vitals not documented were not able to be obtained and vitals that have been documented are patient reported.  Patient Medicare AWV questionnaire was completed by the patient on 02/15/23; I have confirmed that all information answered by patient is correct and no changes since this date.  Cardiac Risk Factors include: advanced age (>79men, >26 women);hypertension;dyslipidemia     Objective:    Today's Vitals   02/17/23 1442  Weight: 121 lb (54.9 kg)  Height: 4\' 10"  (1.473 m)   Body mass index is 25.29 kg/m.     02/17/2023    2:45 PM 07/25/2022    9:50 AM 07/14/2022    1:52 PM 02/14/2022    1:49 PM 11/23/2020   10:36 AM 11/21/2019   10:51 AM 11/20/2018   10:40 AM  Advanced Directives  Does Patient Have a Medical Advance Directive? No No No No No No No  Would patient like information on creating a medical advance directive? Yes (MAU/Ambulatory/Procedural Areas - Information given) No - Patient declined  No - Patient declined No - Patient declined No - Patient declined No - Patient declined    Current Medications (verified) Outpatient Encounter Medications as of 02/17/2023  Medication Sig   ALPRAZolam (XANAX) 0.25 MG tablet Take 1 tablet (0.25 mg total) by mouth at bedtime as needed.   aspirin 81 MG chewable tablet Chew by mouth daily.   atorvastatin (LIPITOR) 20 MG  tablet TAKE 1 TABLET BY MOUTH DAILY AT 6PM   BIOTIN PO Take by mouth.   CALCIUM-MAGNESIUM PO Take by mouth.   Cranberry 400 MG CAPS Take by mouth.   LUMIGAN 0.01 % SOLN    metoprolol succinate (TOPROL-XL) 50 MG 24 hr tablet Take 1 tablet (50 mg total) by mouth daily. with food   pantoprazole (PROTONIX) 40 MG tablet Take 1 tablet (40 mg total) by mouth daily.   pyridOXINE (VITAMIN B-6) 100 MG tablet Take 100 mg by mouth daily.   No facility-administered encounter medications on file as of 02/17/2023.    Allergies (verified) Codeine and Sulfonamide derivatives   History: Past Medical History:  Diagnosis Date   Allergic rhinitis    Atypical mole 05/10/2005   mod-severe- right outer central back   Atypical mole 07/20/2011   moderate- back of right knee   Atypical mole 10/22/2013   mild- left anterior thigh sup   Cerumen impaction    right    Chest discomfort    Chronic constipation    Diverticulosis    GERD (gastroesophageal reflux disease)    Glaucoma    Hyperlipidemia    Kidney stones    Nasal congestion    Osteopenia 03/2018   T score -2.0 FRAX 12% / 2.2% stable from prior DEXA   Palpitation    PUD (peptic ulcer disease)    Scoliosis    Sinusitis    Vitamin D deficiency    Past Surgical History:  Procedure Laterality Date  CARDIAC CATHETERIZATION     COLPOSCOPY     excision of mole  2007   GYNECOLOGIC CRYOSURGERY     LAPAROSCOPIC CHOLECYSTECTOMY     TUBAL LIGATION  1991   Family History  Problem Relation Age of Onset   Hypertension Mother    Heart disease Mother    Hyperlipidemia Mother    Glaucoma Mother    Hypertension Father    Cancer Father        LUNG   Hyperlipidemia Father    Diabetes Sister    Hypertension Sister    Hyperlipidemia Sister    Fibromyalgia Daughter    Asthma Daughter    Social History   Socioeconomic History   Marital status: Married    Spouse name: Eddie   Number of children: 1   Years of education: 14   Highest  education level: Associate degree: academic program  Occupational History   Occupation: Retired    Associate Professor: [employer]  Tobacco Use   Smoking status: Never   Smokeless tobacco: Never  Vaping Use   Vaping status: Never Used  Substance and Sexual Activity   Alcohol use: Yes    Alcohol/week: 0.0 standard drinks of alcohol    Comment: Rare   Drug use: No   Sexual activity: Yes    Birth control/protection: Post-menopausal, Surgical    Comment: Tubal lig-1st intercourse 73 yo-Fewer than 5 partners  Other Topics Concern   Not on file  Social History Narrative   Not on file   Social Drivers of Health   Financial Resource Strain: Low Risk  (12/19/2022)   Overall Financial Resource Strain (CARDIA)    Difficulty of Paying Living Expenses: Not hard at all  Food Insecurity: No Food Insecurity (02/17/2023)   Hunger Vital Sign    Worried About Running Out of Food in the Last Year: Never true    Ran Out of Food in the Last Year: Never true  Transportation Needs: No Transportation Needs (02/17/2023)   PRAPARE - Administrator, Civil Service (Medical): No    Lack of Transportation (Non-Medical): No  Physical Activity: Inactive (02/17/2023)   Exercise Vital Sign    Days of Exercise per Week: 0 days    Minutes of Exercise per Session: 0 min  Stress: No Stress Concern Present (02/17/2023)   [employer] of Occupational Health - Occupational Stress Questionnaire    Feeling of Stress : Only a little  Social Connections: Socially Integrated (02/17/2023)   Social Connection and Isolation Panel [NHANES]    Frequency of Communication with Friends and Family: More than three times a week    Frequency of Social Gatherings with Friends and Family: Once a week    Attends Religious Services: More than 4 times per year    Active Member of [employer] or Organizations: Yes    Attends Engineer, structural: More than 4 times per year    Marital Status: Married    Tobacco  Counseling Counseling given: Not Answered   Clinical Intake:  Pre-visit preparation completed: Yes  Pain : No/denies pain     Diabetes: No  How often do you need to have someone help you when you read instructions, pamphlets, or other written materials from your doctor or pharmacy?: 1 - Never  Interpreter Needed?: No  Information entered by :: Kandis Fantasia LPN   Activities of Daily Living    02/15/2023    9:31 AM 07/25/2022    9:53 AM  In your present  state of health, do you have any difficulty performing the following activities:  Hearing? 1   Vision? 0   Difficulty concentrating or making decisions? 0   Walking or climbing stairs? 0   Dressing or bathing? 0   Doing errands, shopping? 0 0  Preparing Food and eating ? N   Using the Toilet? N   In the past six months, have you accidently leaked urine? N   Do you have problems with loss of bowel control? N   Managing your Medications? N   Managing your Finances? N   Housekeeping or managing your Housekeeping? N     Patient Care Team: Bennie Pierini, FNP as PCP - General (Family Medicine) [facility] (Inactive) (Dermatology) Fontaine, Nadyne Coombes, MD (Inactive) as Consulting Physician (Gynecology) Imaging, [facility] (Diagnostic Radiology) Sinda Du, MD as Consulting Physician (Ophthalmology) Glyn Ade, PA-C as Physician Assistant (Dermatology)  Indicate any recent Medical Services you may have received from other than [facility] providers in the past year (date may be approximate).     Assessment:   This is a routine wellness examination for Isra.  Hearing/Vision screen Hearing Screening - Comments:: Denies hearing difficulties   Vision Screening - Comments:: Wears rx glasses - up to date with routine eye exams with Dr. Cathey Endow     Goals Addressed   None   Depression Screen    02/17/2023    2:44 PM 12/19/2022    8:41 AM 07/11/2022    8:56 AM 07/04/2022     9:37 AM 04/27/2022    2:06 PM 04/27/2022    1:58 PM 02/24/2022   12:21 PM  PHQ 2/9 Scores  PHQ - 2 Score 0 0 0 0 0 0 0  PHQ- 9 Score 0 0 0 1   2    Fall Risk    02/15/2023    9:31 AM 12/19/2022    8:41 AM 07/11/2022    8:56 AM 07/04/2022    9:37 AM 04/27/2022    2:06 PM  Fall Risk   Falls in the past year? 0 0 1 1 0  Number falls in past yr: 0  0 0   Injury with Fall? 0  0 0   Risk for fall due to : No Fall Risks  History of fall(s) History of fall(s)   Follow up Falls prevention discussed;Education provided;Falls evaluation completed  Education provided Education provided     MEDICARE RISK AT HOME: Medicare Risk at Home Any stairs in or around the home?: (Patient-Rptd) Yes If so, are there any without handrails?: (Patient-Rptd) No Home free of loose throw rugs in walkways, pet beds, electrical cords, etc?: (Patient-Rptd) Yes Adequate lighting in your home to reduce risk of falls?: (Patient-Rptd) Yes Life alert?: (Patient-Rptd) No Use of a cane, walker or w/c?: (Patient-Rptd) No Grab bars in the bathroom?: (Patient-Rptd) No Shower chair or bench in shower?: (Patient-Rptd) No Elevated toilet seat or a handicapped toilet?: (Patient-Rptd) Yes  TIMED UP AND GO:  Was the test performed?  No    Cognitive Function:    11/15/2017   11:23 AM 10/19/2016    1:12 PM  MMSE - Mini Mental State Exam  Orientation to time 5 5  Orientation to Place 5 5  Registration 3 3  Attention/ Calculation 5 5  Recall 3 3  Language- name 2 objects 2 2  Language- repeat 1 1  Language- follow 3 step command 3 3  Language- read & follow direction 1  1  Write a sentence 1 1  Copy design 1 1  Total score 30 30        02/17/2023    2:45 PM 02/14/2022    1:50 PM 11/21/2019   10:46 AM 11/20/2018   10:45 AM  6CIT Screen  What Year? 0 points 0 points 0 points 0 points  What month? 0 points 0 points 0 points 0 points  What time? 0 points 0 points 0 points 0 points  Count back from 20 0 points 0  points 0 points 0 points  Months in reverse 0 points 0 points 0 points 0 points  Repeat phrase 0 points 0 points 0 points 0 points  Total Score 0 points 0 points 0 points 0 points    Immunizations Immunization History  Administered Date(s) Administered   Fluad Quad(high Dose 65+) 09/11/2018, 11/21/2019, 11/17/2020, 11/03/2021   Fluad Trivalent(High Dose 65+) 10/18/2022   Influenza, High Dose Seasonal PF 10/19/2016, 10/09/2017   Influenza,inj,Quad PF,6+ Mos 10/14/2015   Influenza-Unspecified 11/18/2014   Moderna Sars-Covid-2 Vaccination 02/13/2019, 03/12/2019, 10/16/2019   Pneumococcal Conjugate-13 07/11/2014   Pneumococcal Polysaccharide-23 07/21/2015   Td 06/29/2021   Tdap 05/14/2011   Zoster Recombinant(Shingrix) 06/17/2020, 08/13/2020   Zoster, Live 04/04/2011    TDAP status: Up to date  Flu Vaccine status: Up to date  Pneumococcal vaccine status: Up to date  Covid-19 vaccine status: Information provided on how to obtain vaccines.   Qualifies for Shingles Vaccine? Yes   Zostavax completed Yes   Shingrix Completed?: Yes  Screening Tests Health Maintenance  Topic Date Due   COVID-19 Vaccine (4 - 2024-25 season) 09/04/2022   Colonoscopy  07/04/2023 (Originally 07/04/2022)   Fecal DNA (Cologuard)  07/17/2023   MAMMOGRAM  01/12/2024   Medicare Annual Wellness (AWV)  02/17/2024   DEXA SCAN  07/05/2024   DTaP/Tdap/Td (3 - Td or Tdap) 06/30/2031   Pneumonia Vaccine 36+ Years old  Completed   INFLUENZA VACCINE  Completed   Hepatitis C Screening  Completed   Zoster Vaccines- Shingrix  Completed   HPV VACCINES  Aged Out    Health Maintenance  Health Maintenance Due  Topic Date Due   COVID-19 Vaccine (4 - 2024-25 season) 09/04/2022    Colorectal cancer screening: Type of screening: Cologuard. Completed 07/16/20. Repeat every 3 years  Mammogram status: Completed 01/12/23. Repeat every year  Bone Density status: Completed 07/06/22. Results reflect: Bone density results:  OSTEOPENIA. Repeat every 2 years.  Lung Cancer Screening: (Low Dose CT Chest recommended if Age 57-80 years, 20 pack-year currently smoking OR have quit w/in 15years.) does not qualify.   Lung Cancer Screening Referral: n/a  Additional Screening:  Hepatitis C Screening: does qualify; Completed 01/20/15  Vision Screening: Recommended annual ophthalmology exams for early detection of glaucoma and other disorders of the eye. Is the patient up to date with their annual eye exam?  Yes  Who is the provider or what is the name of the office in which the patient attends annual eye exams? Dr. Cathey Endow If pt is not established with a provider, would they like to be referred to a provider to establish care? No .   Dental Screening: Recommended annual dental exams for proper oral hygiene  Community Resource Referral / Chronic Care Management: CRR required this visit?  No   CCM required this visit?  No     Plan:     I have personally reviewed and noted the following in the patient's chart:   Medical and  social history Use of alcohol, tobacco or illicit drugs  Current medications and supplements including opioid prescriptions. Patient is not currently taking opioid prescriptions. Functional ability and status Nutritional status Physical activity Advanced directives List of other physicians Hospitalizations, surgeries, and ER visits in previous 12 months Vitals Screenings to include cognitive, depression, and falls Referrals and appointments  In addition, I have reviewed and discussed with patient certain preventive protocols, quality metrics, and best practice recommendations. A written personalized care plan for preventive services as well as general preventive health recommendations were provided to patient.     Kandis Fantasia [place], [place]   1/61/0960   After Visit Summary: (MyChart) Due to this being a telephonic visit, the after visit summary with patients personalized plan was  offered to patient via MyChart   Nurse Notes: No concerns at this time

## 2023-02-20 ENCOUNTER — Ambulatory Visit: Payer: PPO | Admitting: Urology

## 2023-02-20 ENCOUNTER — Ambulatory Visit (HOSPITAL_COMMUNITY)
Admission: RE | Admit: 2023-02-20 | Discharge: 2023-02-20 | Disposition: A | Discharge status: Home / Self Care | Payer: PPO | Source: Ambulatory Visit | Attending: Urology | Admitting: Urology

## 2023-02-20 ENCOUNTER — Encounter: Payer: Self-pay | Admitting: Urology

## 2023-02-20 VITALS — BP 149/82 | HR 65 | Temp 97.7°F

## 2023-02-20 DIAGNOSIS — Z87442 Personal history of urinary calculi: Secondary | ICD-10-CM | POA: Diagnosis not present

## 2023-02-20 DIAGNOSIS — R3129 Other microscopic hematuria: Secondary | ICD-10-CM | POA: Diagnosis not present

## 2023-02-20 DIAGNOSIS — N200 Calculus of kidney: Secondary | ICD-10-CM

## 2023-02-20 LAB — URINALYSIS, ROUTINE W REFLEX MICROSCOPIC
Bilirubin, UA: NEGATIVE
Glucose, UA: NEGATIVE
Ketones, UA: NEGATIVE
Leukocytes,UA: NEGATIVE
Nitrite, UA: NEGATIVE
Specific Gravity, UA: 1.03 (ref 1.005–1.030)
Urobilinogen, Ur: 0.2 mg/dL (ref 0.2–1.0)
pH, UA: 5.5 (ref 5.0–7.5)

## 2023-02-20 LAB — MICROSCOPIC EXAMINATION: Bacteria, UA: NONE SEEN

## 2023-02-27 ENCOUNTER — Ambulatory Visit (HOSPITAL_COMMUNITY)
Admission: RE | Admit: 2023-02-27 | Discharge: 2023-02-27 | Disposition: A | Discharge status: Home / Self Care | Payer: PPO | Source: Ambulatory Visit | Attending: Urology | Admitting: Urology

## 2023-02-27 DIAGNOSIS — N200 Calculus of kidney: Secondary | ICD-10-CM | POA: Diagnosis not present

## 2023-02-27 DIAGNOSIS — R3129 Other microscopic hematuria: Secondary | ICD-10-CM | POA: Diagnosis not present

## 2023-02-27 DIAGNOSIS — N281 Cyst of kidney, acquired: Secondary | ICD-10-CM | POA: Diagnosis not present

## 2023-02-27 DIAGNOSIS — N1330 Unspecified hydronephrosis: Secondary | ICD-10-CM | POA: Diagnosis not present

## 2023-03-02 ENCOUNTER — Telehealth: Payer: Self-pay | Admitting: Urology

## 2023-03-02 NOTE — Telephone Encounter (Signed)
 Patient called for the results of her Ultrasound, she said they were released to her in Mychart , does not look like they have been read.

## 2023-03-10 ENCOUNTER — Telehealth: Admitting: Nurse Practitioner

## 2023-03-10 ENCOUNTER — Encounter: Payer: Self-pay | Admitting: Nurse Practitioner

## 2023-03-10 DIAGNOSIS — J0101 Acute recurrent maxillary sinusitis: Secondary | ICD-10-CM

## 2023-03-10 MED ORDER — AMOXICILLIN-POT CLAVULANATE 875-125 MG PO TABS: 1.0000 | ORAL_TABLET | Freq: Two times a day (BID) | ORAL | 0 refills | Status: DC

## 2023-03-10 NOTE — Patient Instructions (Signed)

## 2023-03-10 NOTE — Progress Notes (Signed)
 Virtual Visit Consent   [name], you are scheduled for a virtual visit with Mary-Margaret Daphine Deutscher, FNP, a [facility] provider, today.     Just as with appointments in the office, your consent must be obtained to participate.  Your consent will be active for this visit and any virtual visit you may have with one of our providers in the next 365 days.     If you have a MyChart account, a copy of this consent can be sent to you electronically.  All virtual visits are billed to your insurance company just like a traditional visit in the office.    As this is a virtual visit, video technology does not allow for your provider to perform a traditional examination.  This may limit your provider's ability to fully assess your condition.  If your provider identifies any concerns that need to be evaluated in person or the need to arrange testing (such as labs, EKG, etc.), we will make arrangements to do so.     Although advances in technology are sophisticated, we cannot ensure that it will always work on either your end or our end.  If the connection with a video visit is poor, the visit may have to be switched to a telephone visit.  With either a video or telephone visit, we are not always able to ensure that we have a secure connection.     I need to obtain your verbal consent now.   Are you willing to proceed with your visit today? YES   [name] has provided verbal consent on 03/10/2023 for a virtual visit (video or telephone).   Mary-Margaret Daphine Deutscher, FNP   Date: 03/10/2023 11:54 AM   Virtual Visit via Video Note   I, Mary-Margaret Daphine Deutscher, connected with [name] ([id number], [birth year]) on 03/10/23 at 12:15 PM EST by a video-enabled telemedicine application and verified that I am speaking with the correct person using two identifiers.  Location: Patient: Virtual Visit Location Patient: Home Provider: Virtual Visit Location Provider: Mobile   I discussed the limitations of  evaluation and management by telemedicine and the availability of in person appointments. The patient expressed understanding and agreed to proceed.    History of Present Illness: [name] is a 73 y.o. who identifies as a female who was assigned female at birth, and is being seen today for uri.  HPI: URI  This is a new problem. The current episode started in the past 7 days. The problem has been waxing and waning. There has been no fever. Associated symptoms include congestion, coughing, rhinorrhea, sinus pain and a sore throat. Associated symptoms comments: Teeth hurting. She has tried acetaminophen and antihistamine for the symptoms. The treatment provided mild relief.    Review of Systems  HENT:  Positive for congestion, rhinorrhea, sinus pain and sore throat.   Respiratory:  Positive for cough.     Problems:  Patient Active Problem List   Diagnosis Date Noted   Kidney stones 08/19/2022   Essential hypertension 03/28/2017   GERD (gastroesophageal reflux disease) 06/12/2012   Vitamin D deficiency 06/12/2012   Osteopenia    Hyperlipidemia with target LDL less than 100 08/05/2009   I B S-CONSTIPATION PREDOMINATE 01/31/2007   ESOPHAGEAL STRICTURE 01/27/2006   Diaphragmatic hernia 01/27/2006   HEMORRHOIDS, EXTERNAL 09/28/2003    Allergies:  Allergies  Allergen Reactions   Codeine Other (See Comments)    GI upset   Sulfonamide Derivatives     REACTION:  severe GI upset   Medications:  Current Outpatient Medications:    ALPRAZolam (XANAX) 0.25 MG tablet, Take 1 tablet (0.25 mg total) by mouth at bedtime as needed., Disp: 30 tablet, Rfl: 5   aspirin 81 MG chewable tablet, Chew by mouth daily., Disp: , Rfl:    atorvastatin (LIPITOR) 20 MG tablet, TAKE 1 TABLET BY MOUTH DAILY AT 6PM, Disp: 90 tablet, Rfl: 1   BIOTIN PO, Take by mouth., Disp: , Rfl:    CALCIUM-MAGNESIUM PO, Take by mouth., Disp: , Rfl:    Cranberry 400 MG CAPS, Take by mouth., Disp: , Rfl:    LUMIGAN 0.01 %  SOLN, , Disp: , Rfl:    metoprolol succinate (TOPROL-XL) 50 MG 24 hr tablet, Take 1 tablet (50 mg total) by mouth daily. with food, Disp: 90 tablet, Rfl: 1   pantoprazole (PROTONIX) 40 MG tablet, Take 1 tablet (40 mg total) by mouth daily., Disp: 90 tablet, Rfl: 1   pyridOXINE (VITAMIN B-6) 100 MG tablet, Take 100 mg by mouth daily., Disp: , Rfl:   Observations/Objective: Patient is well-developed, well-nourished in no acute distress.  Resting comfortably  at home.  Head is normocephalic, atraumatic.  No labored breathing.  Speech is clear and coherent with logical content.  Patient is alert and oriented at baseline.  Raspy voice Maxillary sinus tenderness bil  Assessment and Plan:  [name] in today with chief complaint of No chief complaint on file.   1. Acute recurrent maxillary sinusitis (Primary) 1. Take meds as prescribed 2. Use a cool mist humidifier especially during the winter months and when heat has been humid. 3. Use saline nose sprays frequently 4. Saline irrigations of the nose can be very helpful if done frequently.  * 4X daily for 1 week*  * Use of a nettie pot can be helpful with this. Follow directions with this* 5. Drink plenty of fluids 6. Keep thermostat turn down low 7.For any cough or congestion- mucinex as needed 8. For fever or aces or pains- take tylenol or ibuprofen appropriate for age and weight.  * for fevers greater than 101 orally you may alternate ibuprofen and tylenol every  3 hours.    Meds ordered this encounter  Medications   amoxicillin-clavulanate (AUGMENTIN) 875-125 MG tablet    Sig: Take 1 tablet by mouth 2 (two) times daily.    Dispense:  14 tablet    Refill:  0    Supervising Provider:   Arville Care A [[id number]]      Follow Up Instructions: I discussed the assessment and treatment plan with the patient. The patient was provided an opportunity to ask questions and all were answered. The patient agreed with the plan and  demonstrated an understanding of the instructions.  A copy of instructions were sent to the patient via MyChart.  The patient was advised to call back or seek an in-person evaluation if the symptoms worsen or if the condition fails to improve as anticipated.  Time:  I spent 6 minutes with the patient via telehealth technology discussing the above problems/concerns.    Mary-Margaret Daphine Deutscher, FNP

## 2023-05-18 ENCOUNTER — Ambulatory Visit: Payer: Self-pay | Admitting: *Deleted

## 2023-05-18 NOTE — Telephone Encounter (Signed)
  Chief Complaint: dizziness, diarrhea Symptoms: "swimmy head" at times denies now. Has taken meclizine with some improvement. Diarrhea 5-6 times past 24 hours. Watery stool.  Walks like leaning at times.  Headaches at times.  Frequency: 2 days  Pertinent Negatives: Patient denies chest pain no difficulty breathing no fever reported. No weakness on either side of body, no dizziness now , no blurred vision  Disposition: [] ED /[] Urgent Care (no appt availability in office) / [x] Appointment(In office/virtual)/ []  [facility]/ [] Home Care/ [] Refused Recommended Disposition /[] [place] [facility]/ []  Follow-up with PCP Additional Notes:   Appt scheduled tomorrow. Recommended to drink fluids and incorporate gatorade with water . Call back if sx return or worsen.         Copied from CRM [id number]. Topic: Clinical - Red Word Triage >> May 18, 2023 10:48 AM Danelle Dunning F wrote: [facility] that prompted transfer to Nurse Triage:   Swinging head (dizziness); diarrhea ( going to the bathroom every few minutes  Mepazine (patient has been taking a whole pill before bed for the past two days to help with the dizziness) Reason for Disposition  [1] NO dizziness now AND [2] age > 18  Answer Assessment - Initial Assessment Questions 1. DESCRIPTION: "Describe your dizziness."     lightheaded 2. VERTIGO: "Do you feel like either you or the room is spinning or tilting?"      Room spinning last week 3. LIGHTHEADED: "Do you feel lightheaded?" (e.g., somewhat faint, woozy, weak upon standing)     Head feels "weird" at times  4. SEVERITY: "How bad is it?"  "Can you walk?"   - MILD: Feels slightly dizzy and unsteady, but is walking normally.   - MODERATE: Feels unsteady when walking, but not falling; interferes with normal activities (e.g., school, work).   - SEVERE: Unable to walk without falling, or requires assistance to walk without falling.     No dizziness now but comes at times.  5. ONSET:   "When did the dizziness begin?"     2 days ago  6. AGGRAVATING FACTORS: "Does anything make it worse?" (e.g., standing, change in head position)     Na  7. CAUSE: "What do you think is causing the dizziness?"     Not sure  8. RECURRENT SYMPTOM: "Have you had dizziness before?" If Yes, ask: "When was the last time?" "What happened that time?"     Yes takes meclizine 9. OTHER SYMPTOMS: "Do you have any other symptoms?" (e.g., headache, weakness, numbness, vomiting, earache)     Diarrhea watery 5-6 times in past 24 hours, Swimmy head at times. None now . 10. PREGNANCY: "Is there any chance you are pregnant?" "When was your last menstrual period?"       na  Protocols used: Dizziness - Vertigo-A-AH

## 2023-05-19 ENCOUNTER — Encounter: Payer: Self-pay | Admitting: Nurse Practitioner

## 2023-05-19 ENCOUNTER — Ambulatory Visit: Admitting: Nurse Practitioner

## 2023-05-19 VITALS — BP 134/81 | HR 63 | Temp 97.0°F | Ht <= 58 in | Wt 118.0 lb

## 2023-05-19 DIAGNOSIS — R42 Dizziness and giddiness: Secondary | ICD-10-CM

## 2023-05-19 DIAGNOSIS — K591 Functional diarrhea: Secondary | ICD-10-CM

## 2023-05-19 NOTE — Progress Notes (Signed)
 Subjective:    Patient ID: [name], female    DOB: 1950-12-15, 73 y.o.   MRN: [id number]   Chief Complaint: Dizziness and Diarrhea   Dizziness Pertinent negatives include no abdominal pain, chest pain, diaphoresis, headaches, rash or weakness.  Diarrhea  Pertinent negatives include no abdominal pain or headaches.    Patient comes in c/o dizziness and diarrhea: - dizziness- has frequently- says at least several times a month- took meclizine and that helps but makes her sleepy. Has been dizzy for 3 days this time. -diarrhea yesterday. But has not had since late yesterday afternoon. Has frequently. Patient Active Problem List   Diagnosis Date Noted   Kidney stones 08/19/2022   Essential hypertension 03/28/2017   GERD (gastroesophageal reflux disease) 06/12/2012   Vitamin D  deficiency 06/12/2012   Osteopenia    Hyperlipidemia with target LDL less than 100 08/05/2009   I B S-CONSTIPATION PREDOMINATE 01/31/2007   ESOPHAGEAL STRICTURE 01/27/2006   Diaphragmatic hernia 01/27/2006   HEMORRHOIDS, EXTERNAL 09/28/2003        Review of Systems  Constitutional:  Negative for diaphoresis.  Eyes:  Negative for pain.  Respiratory:  Negative for shortness of breath.   Cardiovascular:  Negative for chest pain, palpitations and leg swelling.  Gastrointestinal:  Positive for diarrhea. Negative for abdominal pain.  Endocrine: Negative for polydipsia.  Skin:  Negative for rash.  Neurological:  Positive for dizziness. Negative for weakness and headaches.  Hematological:  Does not bruise/bleed easily.  All other systems reviewed and are negative.      Objective:   Physical Exam Vitals and nursing note reviewed.  Constitutional:      General: She is not in acute distress.    Appearance: Normal appearance. She is well-developed.  HENT:     Head: Normocephalic.     Right Ear: Tympanic membrane normal.     Left Ear: Tympanic membrane normal.     Nose: Nose normal.      Mouth/Throat:     Mouth: Mucous membranes are moist.  Eyes:     Pupils: Pupils are equal, round, and reactive to light.  Neck:     Vascular: No carotid bruit or JVD.  Cardiovascular:     Rate and Rhythm: Normal rate and regular rhythm.     Heart sounds: Normal heart sounds.  Pulmonary:     Effort: Pulmonary effort is normal. No respiratory distress.     Breath sounds: Normal breath sounds. No wheezing or rales.  Chest:     Chest wall: No tenderness.  Abdominal:     General: Bowel sounds are normal. There is no distension or abdominal bruit.     Palpations: Abdomen is soft. There is no hepatomegaly, splenomegaly, mass or pulsatile mass.     Tenderness: There is no abdominal tenderness.  Musculoskeletal:        General: Normal range of motion.     Cervical back: Normal range of motion and neck supple.  Lymphadenopathy:     Cervical: No cervical adenopathy.  Skin:    General: Skin is warm and dry.  Neurological:     Mental Status: She is alert and oriented to person, place, and time.     Deep Tendon Reflexes: Reflexes are normal and symmetric.  Psychiatric:        Behavior: Behavior normal.        Thought Content: Thought content normal.        Judgment: Judgment normal.     BP 134/81  Pulse 63   Temp (!) 97 F (36.1 C) (Temporal)   Ht 4\' 10"  (1.473 m)   Wt 118 lb (53.5 kg)   SpO2 98%   BMI 24.66 kg/m        Assessment & Plan:   Stafford Eagles in today with chief complaint of Dizziness and Diarrhea   1. Intermittent vertigo (Primary) Try bonine OTC that will not cause drowsiness Force fluids  2. Functional diarrhea Force fluids Imodium OTC RTO prn    The above assessment and management plan was discussed with the patient. The patient verbalized understanding of and has agreed to the management plan. Patient is aware to call the clinic if symptoms persist or worsen. Patient is aware when to return to the clinic for a follow-up visit. Patient educated on when  it is appropriate to go to the emergency department.   [name] Gaylyn Keas, FNP

## 2023-05-19 NOTE — Patient Instructions (Signed)
 Vertigo Vertigo is the feeling that you or the things around you are moving or spinning when they're not. It's different than feeling dizzy. It can also cause: Loss of balance. Trouble standing or walking. Nausea and vomiting. This feeling can come and go at any time. It can last from a few seconds to minutes or even hours. It may go away on its own or be treated with medicine. What are the types of vertigo? There are two types of vertigo: Peripheral vertigo happens when parts of your inner ear don't work like they should. This is the more common type. Central vertigo happens when your brain and spinal cord don't work like they should. Your health care provider will do tests to find out what kind of vertigo you have. This will help them decide on the right treatment for you. Follow these instructions at home: Eating and drinking Drink enough fluid to keep your pee (urine) pale yellow. Do not drink alcohol. Activity When you get up in the morning, first sit up on the side of the bed. When you feel okay, stand slowly while holding onto something. Move slowly. Avoid sudden body or head movements. Avoid certain positions, as told by your provider. Use a cane if you have trouble standing or walking. Sit down right away if you feel unsteady. Place items in your home so they're easy for you to reach without bending or leaning over. Return to normal activities when you're told. Ask what things are safe for you to do. General instructions Take your medicines only as told by your provider. Contact a health care provider if: Your medicines don't help or make your vertigo worse. You get new symptoms. You have a fever. You have nausea or vomiting. Your family or friends spot any changes in how you're acting. A part of your body goes numb. You feel tingling and prickling in a part of your body. You get very bad headaches. Get help right away if: You're always dizzy or you faint. You have a  stiff neck. You have trouble moving or speaking. Your hands, arms, or legs feel weak. Your hearing or eyesight changes. These symptoms may be an emergency. Call 911 right away. Do not wait to see if the symptoms will go away. Do not drive yourself to the hospital. This information is not intended to replace advice given to you by your health care provider. Make sure you discuss any questions you have with your health care provider. Document Revised: 09/22/2022 Document Reviewed: 03/25/2022 Elsevier Patient Education  2024 [employer].

## 2023-06-05 DIAGNOSIS — H524 Presbyopia: Secondary | ICD-10-CM | POA: Diagnosis not present

## 2023-06-05 DIAGNOSIS — H40023 Open angle with borderline findings, high risk, bilateral: Secondary | ICD-10-CM | POA: Diagnosis not present

## 2023-06-05 DIAGNOSIS — H2513 Age-related nuclear cataract, bilateral: Secondary | ICD-10-CM | POA: Diagnosis not present

## 2023-06-20 ENCOUNTER — Encounter: Payer: Self-pay | Admitting: Nurse Practitioner

## 2023-06-20 ENCOUNTER — Ambulatory Visit: Payer: PPO | Admitting: Nurse Practitioner

## 2023-06-20 VITALS — BP 130/80 | HR 57 | Temp 97.2°F | Ht <= 58 in | Wt 118.0 lb

## 2023-06-20 DIAGNOSIS — I10 Essential (primary) hypertension: Secondary | ICD-10-CM | POA: Diagnosis not present

## 2023-06-20 DIAGNOSIS — K449 Diaphragmatic hernia without obstruction or gangrene: Secondary | ICD-10-CM

## 2023-06-20 DIAGNOSIS — E785 Hyperlipidemia, unspecified: Secondary | ICD-10-CM | POA: Diagnosis not present

## 2023-06-20 DIAGNOSIS — F411 Generalized anxiety disorder: Secondary | ICD-10-CM | POA: Diagnosis not present

## 2023-06-20 DIAGNOSIS — M8588 Other specified disorders of bone density and structure, other site: Secondary | ICD-10-CM

## 2023-06-20 DIAGNOSIS — Z0001 Encounter for general adult medical examination with abnormal findings: Secondary | ICD-10-CM | POA: Diagnosis not present

## 2023-06-20 DIAGNOSIS — Z0000 Encounter for general adult medical examination without abnormal findings: Secondary | ICD-10-CM | POA: Diagnosis not present

## 2023-06-20 DIAGNOSIS — E559 Vitamin D deficiency, unspecified: Secondary | ICD-10-CM | POA: Diagnosis not present

## 2023-06-20 DIAGNOSIS — K219 Gastro-esophageal reflux disease without esophagitis: Secondary | ICD-10-CM | POA: Diagnosis not present

## 2023-06-20 MED ORDER — ALPRAZOLAM 0.25 MG PO TABS: 0.2500 mg | ORAL_TABLET | Freq: Every evening | ORAL | 5 refills | Status: DC | PRN

## 2023-06-20 MED ORDER — METOPROLOL SUCCINATE ER 50 MG PO TB24: 50.0000 mg | ORAL_TABLET | Freq: Every day | ORAL | 1 refills | Status: AC

## 2023-06-20 MED ORDER — PANTOPRAZOLE SODIUM 40 MG PO TBEC: 40.0000 mg | DELAYED_RELEASE_TABLET | Freq: Every day | ORAL | 1 refills | Status: AC

## 2023-06-20 MED ORDER — ATORVASTATIN CALCIUM 20 MG PO TABS: ORAL_TABLET | ORAL | 1 refills | Status: AC

## 2023-06-20 NOTE — Progress Notes (Signed)
 Subjective:    Patient ID: [name], female    DOB: Apr 20, 1950, 73 y.o.   MRN: [id number]   Chief Complaint: annual physical      HPI:  [name] is a 73 y.o. who identifies as a female who was assigned female at birth.   Social history: Lives with: husband Work history: retired   Water engineer in today for follow up of the following chronic medical issues:  1. Essential hypertension No c/o chest pain, sob or headache. Does not check blood pressure at home. BP Readings from Last 3 Encounters:  05/19/23 134/81  02/20/23 (!) 149/82  12/19/22 124/84     2. Hyperlipidemia with target LDL less than 100 Does watch diet and stays very active walks for exercise several times a week. Lab Results  Component Value Date   CHOL 179 12/19/2022   HDL 45 12/19/2022   LDLCALC 101 (H) 12/19/2022   TRIG 191 (H) 12/19/2022   CHOLHDL 4.0 12/19/2022   The 10-year ASCVD risk score (Arnett DK, et al., 2019) is: 19.4%  3. Diaphragmatic hernia without obstruction and without gangrene Has no issues  4. Gastroesophageal reflux disease without esophagitis Is on protonix  and is doing well.  5. Osteopenia of lumbar spine Does weight bearing exercise daily. Last dexascan was done on 07/04/22  6. Vitamin D  deficiency Is on daily vitamin d  supplement   New complaints: None today  Allergies  Allergen Reactions   Codeine Other (See Comments)    GI upset   Sulfonamide Derivatives     REACTION: severe GI upset   Outpatient Encounter Medications as of 06/20/2023  Medication Sig   ALPRAZolam  (XANAX ) 0.25 MG tablet Take 1 tablet (0.25 mg total) by mouth at bedtime as needed.   aspirin 81 MG chewable tablet Chew by mouth daily.   atorvastatin  (LIPITOR) 20 MG tablet TAKE 1 TABLET BY MOUTH DAILY AT 6PM   BIOTIN PO Take by mouth.   CALCIUM -MAGNESIUM PO Take by mouth.   Cranberry 400 MG CAPS Take by mouth.   LUMIGAN 0.01 % SOLN    metoprolol  succinate (TOPROL -XL) 50 MG 24 hr tablet Take 1  tablet (50 mg total) by mouth daily. with food   pantoprazole  (PROTONIX ) 40 MG tablet Take 1 tablet (40 mg total) by mouth daily.   pyridOXINE (VITAMIN B-6) 100 MG tablet Take 100 mg by mouth daily.   No facility-administered encounter medications on file as of 06/20/2023.    Past Surgical History:  Procedure Laterality Date   CARDIAC CATHETERIZATION     COLPOSCOPY     excision of mole  2007   GYNECOLOGIC CRYOSURGERY     LAPAROSCOPIC CHOLECYSTECTOMY     TUBAL LIGATION  1991    Family History  Problem Relation Age of Onset   Hypertension Mother    Heart disease Mother    Hyperlipidemia Mother    Glaucoma Mother    Hypertension Father    Cancer Father        LUNG   Hyperlipidemia Father    Diabetes Sister    Hypertension Sister    Hyperlipidemia Sister    Fibromyalgia Daughter    Asthma Daughter       Controlled substance contract: 07/12/22     Review of Systems  Constitutional:  Negative for diaphoresis.  Eyes:  Negative for pain.  Respiratory:  Negative for shortness of breath.   Cardiovascular:  Negative for chest pain, palpitations and leg swelling.  Gastrointestinal:  Negative for abdominal  pain.  Endocrine: Negative for polydipsia.  Skin:  Negative for rash.  Neurological:  Negative for dizziness, weakness and headaches.  Hematological:  Does not bruise/bleed easily.  All other systems reviewed and are negative.      Objective:   Physical Exam Vitals and nursing note reviewed.  Constitutional:      General: She is not in acute distress.    Appearance: Normal appearance. She is well-developed.  HENT:     Head: Normocephalic.     Right Ear: Tympanic membrane normal.     Left Ear: Tympanic membrane normal.     Nose: Nose normal.     Mouth/Throat:     Mouth: Mucous membranes are moist.   Eyes:     Pupils: Pupils are equal, round, and reactive to light.   Neck:     Vascular: No carotid bruit or JVD.   Cardiovascular:     Rate and Rhythm: Normal  rate and regular rhythm.     Heart sounds: Normal heart sounds.  Pulmonary:     Effort: Pulmonary effort is normal. No respiratory distress.     Breath sounds: Normal breath sounds. No wheezing or rales.  Chest:     Chest wall: No tenderness.  Abdominal:     General: Bowel sounds are normal. There is no distension or abdominal bruit.     Palpations: Abdomen is soft. There is no hepatomegaly, splenomegaly, mass or pulsatile mass.     Tenderness: There is no abdominal tenderness.   Musculoskeletal:        General: Normal range of motion.     Cervical back: Normal range of motion and neck supple.  Lymphadenopathy:     Cervical: No cervical adenopathy.   Skin:    General: Skin is warm and dry.   Neurological:     Mental Status: She is alert and oriented to person, place, and time.     Deep Tendon Reflexes: Reflexes are normal and symmetric.   Psychiatric:        Behavior: Behavior normal.        Thought Content: Thought content normal.        Judgment: Judgment normal.    BP 130/80   Pulse (!) 57   Temp (!) 97.2 F (36.2 C) (Temporal)   Ht 4' 10 (1.473 m)   Wt 118 lb (53.5 kg)   SpO2 98%   BMI 24.66 kg/m          Assessment & Plan:   [name] comes in today with chief complaint of Annual physical Diagnosis and orders addressed:  1. Essential hypertension (Primary) Low sodium diet - metoprolol  succinate (TOPROL -XL) 50 MG 24 hr tablet; Take 1 tablet (50 mg total) by mouth daily. with food  Dispense: 90 tablet; Refill: 1 - CBC with Differential/Platelet - CMP14+EGFR  2. Hyperlipidemia with target LDL less than 100 Lowfat diet - atorvastatin  (LIPITOR) 20 MG tablet; TAKE 1 TABLET BY MOUTH DAILY AT 6PM  Dispense: 90 tablet; Refill: 1 - Lipid panel  3. Diaphragmatic hernia without obstruction and without gangrene  4. Gastroesophageal reflux disease without esophagitis Avoid spicy foods Do not eat 2 hours prior to bedtime - pantoprazole  (PROTONIX ) 40 MG  tablet; Take 1 tablet (40 mg total) by mouth daily.  Dispense: 90 tablet; Refill: 1  5. Osteopenia of lumbar spine Continue weight bearing exercises  6. Vitamin D  deficiency   7. GAD (generalized anxiety disorder) Stress management - ALPRAZolam  (XANAX ) 0.25 MG tablet; Take 1  tablet (0.25 mg total) by mouth at bedtime as needed.  Dispense: 30 tablet; Refill: 5   Labs pending Health Maintenance reviewed Diet and exercise encouraged  Follow up plan: 6 months   [name], FNP

## 2023-06-20 NOTE — Patient Instructions (Signed)
 Exercising to Stay Healthy To become healthy and stay healthy, it is recommended that you do moderate-intensity and vigorous-intensity exercise. You can tell that you are exercising at a moderate intensity if your heart starts beating faster and you start breathing faster but can still hold a conversation. You can tell that you are exercising at a vigorous intensity if you are breathing much harder and faster and cannot hold a conversation while exercising. How can exercise benefit me? Exercising regularly is important. It has many health benefits, such as: Improving overall fitness, flexibility, and endurance. Increasing bone density. Helping with weight control. Decreasing body fat. Increasing muscle strength and endurance. Reducing stress and tension, anxiety, depression, or anger. Improving overall health. What guidelines should I follow while exercising? Before you start a new exercise program, talk with your health care provider. Do not exercise so much that you hurt yourself, feel dizzy, or get very short of breath. Wear comfortable clothes and wear shoes with good support. Drink plenty of water while you exercise to prevent dehydration or heat stroke. Work out until your breathing and your heartbeat get faster (moderate intensity). How often should I exercise? Choose an activity that you enjoy, and set realistic goals. Your health care provider can help you make an activity plan that is individually designed and works best for you. Exercise regularly as told by your health care provider. This may include: Doing strength training two times a week, such as: Lifting weights. Using resistance bands. Push-ups. Sit-ups. Yoga. Doing a certain intensity of exercise for a given amount of time. Choose from these options: A total of 150 minutes of moderate-intensity exercise every week. A total of 75 minutes of vigorous-intensity exercise every week. A mix of moderate-intensity and  vigorous-intensity exercise every week. Children, pregnant women, people who have not exercised regularly, people who are overweight, and older adults may need to talk with a health care provider about what activities are safe to perform. If you have a medical condition, be sure to talk with your health care provider before you start a new exercise program. What are some exercise ideas? Moderate-intensity exercise ideas include: Walking 1 mile (1.6 km) in about 15 minutes. Biking. Hiking. Golfing. Dancing. Water aerobics. Vigorous-intensity exercise ideas include: Walking 4.5 miles (7.2 km) or more in about 1 hour. Jogging or running 5 miles (8 km) in about 1 hour. Biking 10 miles (16.1 km) or more in about 1 hour. Lap swimming. Roller-skating or in-line skating. Cross-country skiing. Vigorous competitive sports, such as football, basketball, and soccer. Jumping rope. Aerobic dancing. What are some everyday activities that can help me get exercise? Yard work, such as: Child psychotherapist. Raking and bagging leaves. Washing your car. Pushing a stroller. Shoveling snow. Gardening. Washing windows or floors. How can I be more active in my day-to-day activities? Use stairs instead of an elevator. Take a walk during your lunch break. If you drive, park your car farther away from your work or school. If you take public transportation, get off one stop early and walk the rest of the way. Stand up or walk around during all of your indoor phone calls. Get up, stretch, and walk around every 30 minutes throughout the day. Enjoy exercise with a friend. Support to continue exercising will help you keep a regular routine of activity. Where to find more information You can find more information about exercising to stay healthy from: U.S. Department of Health and Human Services: ThisPath.fi Centers for Disease Control and Prevention (  CDC): FootballExhibition.com.br Summary Exercising regularly is  important. It will improve your overall fitness, flexibility, and endurance. Regular exercise will also improve your overall health. It can help you control your weight, reduce stress, and improve your bone density. Do not exercise so much that you hurt yourself, feel dizzy, or get very short of breath. Before you start a new exercise program, talk with your health care provider. This information is not intended to replace advice given to you by your health care provider. Make sure you discuss any questions you have with your health care provider. Document Revised: 04/17/2020 Document Reviewed: 04/17/2020 Elsevier Patient Education  2024 [employer].

## 2023-06-21 LAB — CMP14+EGFR
ALT: 18 IU/L (ref 0–32)
AST: 19 IU/L (ref 0–40)
Albumin: 4.2 g/dL (ref 3.8–4.8)
Alkaline Phosphatase: 99 IU/L (ref 44–121)
BUN/Creatinine Ratio: 16 (ref 12–28)
BUN: 13 mg/dL (ref 8–27)
Bilirubin Total: 0.4 mg/dL (ref 0.0–1.2)
CO2: 23 mmol/L (ref 20–29)
Calcium: 9.8 mg/dL (ref 8.7–10.3)
Chloride: 102 mmol/L (ref 96–106)
Creatinine, Ser: 0.81 mg/dL (ref 0.57–1.00)
Globulin, Total: 2.4 g/dL (ref 1.5–4.5)
Glucose: 92 mg/dL (ref 70–99)
Potassium: 4 mmol/L (ref 3.5–5.2)
Sodium: 141 mmol/L (ref 134–144)
Total Protein: 6.6 g/dL (ref 6.0–8.5)
eGFR: 76 mL/min/{1.73_m2} (ref >59)

## 2023-06-21 LAB — CBC WITH DIFFERENTIAL/PLATELET
Basophils Absolute: 0 10*3/uL (ref 0.0–0.2)
Basos: 1 %
EOS (ABSOLUTE): 0.4 10*3/uL (ref 0.0–0.4)
Eos: 5 %
Hematocrit: 42 % (ref 34.0–46.6)
Hemoglobin: 13.6 g/dL (ref 11.1–15.9)
Immature Grans (Abs): 0 10*3/uL (ref 0.0–0.1)
Immature Granulocytes: 0 %
Lymphocytes Absolute: 2.9 10*3/uL (ref 0.7–3.1)
Lymphs: 43 %
MCH: 30.8 pg (ref 26.6–33.0)
MCHC: 32.4 g/dL (ref 31.5–35.7)
MCV: 95 fL (ref 79–97)
Monocytes Absolute: 0.5 10*3/uL (ref 0.1–0.9)
Monocytes: 7 %
Neutrophils Absolute: 3 10*3/uL (ref 1.4–7.0)
Neutrophils: 44 %
Platelets: 212 10*3/uL (ref 150–450)
RBC: 4.41 x10E6/uL (ref 3.77–5.28)
RDW: 13.2 % (ref 11.7–15.4)
WBC: 6.9 10*3/uL (ref 3.4–10.8)

## 2023-06-21 LAB — VITAMIN D 25 HYDROXY (VIT D DEFICIENCY, FRACTURES): Vit D, 25-Hydroxy: 42 ng/mL (ref 30.0–100.0)

## 2023-06-21 LAB — LIPID PANEL
Chol/HDL Ratio: 3.4 ratio (ref 0.0–4.4)
Cholesterol, Total: 151 mg/dL (ref 100–199)
HDL: 45 mg/dL (ref >39)
LDL Chol Calc (NIH): 77 mg/dL (ref 0–99)
Triglycerides: 170 mg/dL — ABNORMAL HIGH (ref 0–149)
VLDL Cholesterol Cal: 29 mg/dL (ref 5–40)

## 2023-06-21 LAB — THYROID PANEL WITH TSH
Free Thyroxine Index: 2.4 (ref 1.2–4.9)
T3 Uptake Ratio: 26 % (ref 24–39)
T4, Total: 9.2 ug/dL (ref 4.5–12.0)
TSH: 1.34 u[IU]/mL (ref 0.450–4.500)

## 2023-06-22 ENCOUNTER — Ambulatory Visit: Payer: Self-pay | Admitting: Nurse Practitioner

## 2023-07-19 ENCOUNTER — Telehealth: Payer: Self-pay | Admitting: Nurse Practitioner

## 2023-07-19 ENCOUNTER — Other Ambulatory Visit: Payer: Self-pay

## 2023-07-19 DIAGNOSIS — Z1211 Encounter for screening for malignant neoplasm of colon: Secondary | ICD-10-CM

## 2023-07-19 NOTE — Telephone Encounter (Signed)
 Spoke with pt , cologuard has been sent

## 2023-07-19 NOTE — Telephone Encounter (Signed)
 Copied from CRM [id number]. Topic: General - Other >> Jul 19, 2023  2:44 PM Turkey B wrote: Reason for CRM: pt called in has question if she should do a colonoscopy soon or a cologuard. Please cb

## 2023-07-29 DIAGNOSIS — Z1211 Encounter for screening for malignant neoplasm of colon: Secondary | ICD-10-CM | POA: Diagnosis not present

## 2023-08-04 ENCOUNTER — Ambulatory Visit: Payer: Self-pay | Admitting: Nurse Practitioner

## 2023-08-04 LAB — COLOGUARD: COLOGUARD: NEGATIVE

## 2023-08-16 ENCOUNTER — Telehealth: Payer: Self-pay

## 2023-08-16 NOTE — Telephone Encounter (Signed)
 Called pt to reschedule appt with MD McKenzie appt rescheduled and confirmed w/ pt

## 2023-08-21 ENCOUNTER — Ambulatory Visit: Payer: PPO | Admitting: Urology

## 2023-10-04 ENCOUNTER — Ambulatory Visit: Admitting: Urology

## 2023-10-04 ENCOUNTER — Ambulatory Visit (HOSPITAL_COMMUNITY)
Admission: RE | Admit: 2023-10-04 | Discharge: 2023-10-04 | Disposition: A | Discharge status: Home / Self Care | Source: Ambulatory Visit | Attending: Urology | Admitting: Urology

## 2023-10-04 ENCOUNTER — Encounter: Payer: Self-pay | Admitting: Urology

## 2023-10-04 VITALS — BP 131/75 | HR 66

## 2023-10-04 DIAGNOSIS — Z09 Encounter for follow-up examination after completed treatment for conditions other than malignant neoplasm: Secondary | ICD-10-CM | POA: Diagnosis not present

## 2023-10-04 DIAGNOSIS — N200 Calculus of kidney: Secondary | ICD-10-CM

## 2023-10-04 DIAGNOSIS — Z87442 Personal history of urinary calculi: Secondary | ICD-10-CM | POA: Diagnosis not present

## 2023-10-04 DIAGNOSIS — R319 Hematuria, unspecified: Secondary | ICD-10-CM | POA: Diagnosis not present

## 2023-10-04 DIAGNOSIS — R3129 Other microscopic hematuria: Secondary | ICD-10-CM | POA: Insufficient documentation

## 2023-10-04 LAB — URINALYSIS, ROUTINE W REFLEX MICROSCOPIC
Bilirubin, UA: NEGATIVE
Glucose, UA: NEGATIVE
Ketones, UA: NEGATIVE
Leukocytes,UA: NEGATIVE
Nitrite, UA: NEGATIVE
Specific Gravity, UA: 1.02 (ref 1.005–1.030)
Urobilinogen, Ur: 0.2 mg/dL (ref 0.2–1.0)
pH, UA: 6 (ref 5.0–7.5)

## 2023-10-04 LAB — MICROSCOPIC EXAMINATION: Bacteria, UA: NONE SEEN

## 2023-10-04 NOTE — Progress Notes (Signed)
 10/04/2023 2:06 PM   [name] Dec 12, 1950 [id number]  Referring provider: Gladis Mustard, FNP [address] [place],  [place] [address]  Followup nephrolithiasis   HPI: Ms [name] is a 73yo here for followup for nephrolithiasis. KUB from today shows no definitive stones. She drinks 12-20oz of water daily. She drinks 16oz of soda daily. No flank pain. No stone events since last visit.    PMH: Past Medical History:  Diagnosis Date   Allergic rhinitis    Atypical mole 05/10/2005   mod-severe- right outer central back   Atypical mole 07/20/2011   moderate- back of right knee   Atypical mole 10/22/2013   mild- left anterior thigh sup   Cerumen impaction    right    Chest discomfort    Chronic constipation    Diverticulosis    GERD (gastroesophageal reflux disease)    Glaucoma    Hyperlipidemia    Kidney stones    Nasal congestion    Osteopenia 03/2018   T score -2.0 FRAX 12% / 2.2% stable from prior DEXA   Palpitation    PUD (peptic ulcer disease)    Scoliosis    Sinusitis    Vitamin D  deficiency     Surgical History: Past Surgical History:  Procedure Laterality Date   CARDIAC CATHETERIZATION     COLPOSCOPY     excision of mole  2007   GYNECOLOGIC CRYOSURGERY     LAPAROSCOPIC CHOLECYSTECTOMY     TUBAL LIGATION  1991    Home Medications:  Allergies as of 10/04/2023       Reactions   Codeine Other (See Comments)   GI upset   Sulfonamide Derivatives    REACTION: severe GI upset        Medication List        Accurate as of October 04, 2023  2:06 PM. If you have any questions, ask your nurse or doctor.          ALPRAZolam  0.25 MG tablet Commonly known as: XANAX  Take 1 tablet (0.25 mg total) by mouth at bedtime as needed.   aspirin 81 MG chewable tablet Chew by mouth daily.   atorvastatin  20 MG tablet Commonly known as: LIPITOR TAKE 1 TABLET BY MOUTH DAILY AT 6PM   BIOTIN PO Take by mouth.   CALCIUM -MAGNESIUM PO Take by  mouth.   Cranberry 400 MG Caps Take by mouth.   Lumigan 0.01 % Soln Generic drug: bimatoprost   metoprolol  succinate 50 MG 24 hr tablet Commonly known as: TOPROL -XL Take 1 tablet (50 mg total) by mouth daily. with food   pantoprazole  40 MG tablet Commonly known as: PROTONIX  Take 1 tablet (40 mg total) by mouth daily.        Allergies:  Allergies  Allergen Reactions   Codeine Other (See Comments)    GI upset   Sulfonamide Derivatives     REACTION: severe GI upset    Family History: Family History  Problem Relation Age of Onset   Hypertension Mother    Heart disease Mother    Hyperlipidemia Mother    Glaucoma Mother    Hypertension Father    Cancer Father        LUNG   Hyperlipidemia Father    Diabetes Sister    Hypertension Sister    Hyperlipidemia Sister    Fibromyalgia Daughter    Asthma Daughter     Social History:  reports that she has never smoked. She has never used smokeless tobacco.  She reports current alcohol use. She reports that she does not use drugs.  ROS: All other review of systems were reviewed and are negative except what is noted above in HPI  Physical Exam: BP 131/75   Pulse 66   Constitutional:  Alert and oriented, No acute distress. HEENT: [place] AT, moist mucus membranes.  Trachea midline, no masses. Cardiovascular: No clubbing, cyanosis, or edema. Respiratory: Normal respiratory effort, no increased work of breathing. GI: Abdomen is soft, nontender, nondistended, no abdominal masses GU: No CVA tenderness.  Lymph: No cervical or inguinal lymphadenopathy. Skin: No rashes, bruises or suspicious lesions. Neurologic: Grossly intact, no focal deficits, moving all 4 extremities. Psychiatric: Normal mood and affect.  Laboratory Data: Lab Results  Component Value Date   WBC 6.9 06/20/2023   HGB 13.6 06/20/2023   HCT 42.0 06/20/2023   MCV 95 06/20/2023   PLT 212 06/20/2023    Lab Results  Component Value Date   CREATININE 0.81  06/20/2023    No results found for: PSA  No results found for: TESTOSTERONE  No results found for: HGBA1C  Urinalysis    Component Value Date/Time   COLORURINE AMBER (A) 07/14/2022 1449   APPEARANCEUR Clear 02/20/2023 1022   LABSPEC 1.009 07/14/2022 1449   PHURINE 7.0 07/14/2022 1449   GLUCOSEU Negative 02/20/2023 1022   HGBUR SMALL (A) 07/14/2022 1449   BILIRUBINUR Negative 02/20/2023 1022   KETONESUR NEGATIVE 07/14/2022 1449   PROTEINUR 2+ (A) 02/20/2023 1022   PROTEINUR 30 (A) 07/14/2022 1449   UROBILINOGEN 0.2 08/02/2013 1512   NITRITE Negative 02/20/2023 1022   NITRITE POSITIVE (A) 07/14/2022 1449   LEUKOCYTESUR Negative 02/20/2023 1022   LEUKOCYTESUR NEGATIVE 07/14/2022 1449    Lab Results  Component Value Date   LABMICR See below: 02/20/2023   WBCUA 0-5 02/20/2023   LABEPIT 0-10 02/20/2023   MUCUS Present (A) 07/11/2022   BACTERIA None seen 02/20/2023    Pertinent Imaging: KUB today: Images reviewed and discussed with the patient  Results for orders placed during the hospital encounter of 02/20/23  Abdomen 1 view (KUB)  Narrative CLINICAL DATA:  Nephrolithiasis  EXAM: ABDOMEN - 1 VIEW  COMPARISON:  Abdominal radiograph 08/19/2022  FINDINGS: Lung bases are clear. Nonobstructed bowel-gas pattern. Surgical clips. Lumbar spine degenerative changes. No nephrolithiasis identified.  IMPRESSION: No nephrolithiasis identified.   Electronically Signed By: Bard Moats M.D. On: 03/05/2023 17:29  No results found for this or any previous visit.  No results found for this or any previous visit.  No results found for this or any previous visit.  Results for orders placed during the hospital encounter of 02/27/23  US  RENAL  Narrative : PROCEDURE: US  RENAL  HISTORY: Patient is a 73 y/o F with microscopic hematuria and kidney stones.  COMPARISON: CT AP 07/14/2022, U/S abdomen 01/08/2007.  TECHNIQUE: Two-dimensional grayscale and color  Doppler ultrasound of the kidneys was performed.  FINDINGS: The urinary bladder demonstrates normal anechoic echogenicity. The bilateral ureteral jets are visualized. Bladder volume is 211 mL. Postvoid bladder volume is 29 mL. 14% postvoid residual bladder volume.  The right kidney measures 9.9 x 4.7 x 4.5 cm. Renal cortical echotexture is within normal limits. There is moderate pelviectasis, persistent postvoid. There are no stones. There are no cysts.  The left kidney measures 9.7 x 5.1 x 4.5 cm. Renal cortical echotexture is within normal limits. A 0.4 cm calcification is noted within the mid cortex. There is mild pelviectasis, persistent postvoid. There are no stones. There is  a simple cyst measuring 1.2 cm at the superior pole.  Fatty liver is incidentally noted.  IMPRESSION: 1.  Moderate right and mild left pelviectasis, persistent postvoid.  2.  14% postvoid residual bladder volume.  3.  Simple left renal cyst.  Thank you for allowing us  to assist in the care of this patient.   Electronically Signed By: Lynwood Mains M.D. On: 02/27/2023 22:21  No results found for this or any previous visit.  No results found for this or any previous visit.  No results found for this or any previous visit.   Assessment & Plan:    1. Kidney stones (Primary) Followup 1 year with KUB - Urinalysis, Routine w reflex microscopic   No follow-ups on file.  Belvie Clara, MD  [facility] [place]

## 2023-10-04 NOTE — Patient Instructions (Signed)

## 2023-10-31 ENCOUNTER — Ambulatory Visit (INDEPENDENT_AMBULATORY_CARE_PROVIDER_SITE_OTHER)

## 2023-10-31 DIAGNOSIS — Z23 Encounter for immunization: Secondary | ICD-10-CM

## 2023-11-29 ENCOUNTER — Ambulatory Visit: Payer: Self-pay

## 2023-11-29 ENCOUNTER — Ambulatory Visit: Admitting: Family Medicine

## 2023-11-29 VITALS — BP 142/77 | HR 62 | Temp 97.8°F | Ht <= 58 in | Wt 120.6 lb

## 2023-11-29 DIAGNOSIS — M5432 Sciatica, left side: Secondary | ICD-10-CM

## 2023-11-29 DIAGNOSIS — M79672 Pain in left foot: Secondary | ICD-10-CM | POA: Diagnosis not present

## 2023-11-29 MED ORDER — METHYLPREDNISOLONE ACETATE 80 MG/ML IJ SUSP
80.0000 mg | Freq: Once | INTRAMUSCULAR | Status: AC
  Administered 2023-11-29: 40 mg via INTRAMUSCULAR

## 2023-11-29 MED ORDER — METHYLPREDNISOLONE ACETATE 40 MG/ML IJ SUSP: 40.0000 mg | Freq: Once | INTRAMUSCULAR | Status: DC

## 2023-11-29 MED ORDER — PREDNISONE 20 MG PO TABS: 40.0000 mg | ORAL_TABLET | Freq: Every day | ORAL | 0 refills | Status: AC

## 2023-11-29 NOTE — Addendum Note (Signed)
 Addended by: MAR CHIQUITA HERO on: 11/29/2023 11:45 AM   Modules accepted: Orders

## 2023-11-29 NOTE — Progress Notes (Signed)
 Acute Office Visit  Subjective:     Patient ID: [name], female    DOB: 1950/08/19, 73 y.o.   MRN: [id number]  Chief Complaint  Patient presents with   Back Pain    Left lower back pain that radiates down left leg started few days ago     HPI  History of Present Illness   [name] is a 73 year old female with sciatica who presents with left lower back pain radiating down the left leg.  Lumbosacral radicular pain - Left lower back pain radiating down the left leg, onset a few days ago after lifting Christmas decorations - Pain described as constant ache with burning and sharp sensations intermittently - Pain severity rated as 8 out of 10 - History of similar sciatica episodes managed with steroid injections and prednisone  - Tylenol  provides partial relief but does not completely alleviate the pain - Pain worsens with sitting and improves with movement - No recent injury or fall - No changes in bowel or bladder control - Remains active despite discomfort, including activities such as cooking  Bilateral heel and foot pain with paresthesia - Pain in both feet, particularly in the heels, with the left heel worse than the right - Numbness and tingling in the left heel, especially with prolonged standing - Upcoming appointment with podiatrist for further evaluation       ROS As per HPI.      Objective:    BP (!) 142/77   Pulse 62   Temp 97.8 F (36.6 C) (Temporal)   Ht 4' 10 (1.473 m)   Wt 120 lb 9.6 oz (54.7 kg)   SpO2 98%   BMI 25.21 kg/m    Physical Exam Vitals and nursing note reviewed.  Constitutional:      General: She is not in acute distress.    Appearance: She is not ill-appearing, toxic-appearing or diaphoretic.  Pulmonary:     Effort: Pulmonary effort is normal. No respiratory distress.  Musculoskeletal:     Lumbar back: Tenderness (left gluteal) present. No swelling, edema or bony tenderness. Positive left straight leg raise test.   Neurological:     General: No focal deficit present.     Mental Status: She is alert and oriented to person, place, and time.  Psychiatric:        Mood and Affect: Mood normal.        Behavior: Behavior normal.     No results found for any visits on 11/29/23.      Assessment & Plan:   [name] was seen today for back pain.  Diagnoses and all orders for this visit:  Left sided sciatica -     methylPREDNISolone  acetate (DEPO-MEDROL ) injection 40 mg -     predniSONE  (DELTASONE ) 20 MG tablet; Take 2 tablets (40 mg total) by mouth daily with breakfast for 5 days. Start tomorrow if pain is not improved.  Pain of left heel   Assessment and Plan    Left-sided sciatica Acute exacerbation with severe radiating pain. Previous episodes responded to steroids. No bowel or bladder dysfunction. - Administered steroid injection. - Prescribed prednisone , two tablets daily for five days. Start tomorrow if no improvement, with option to stop if symptoms improve after three days. - Advised to avoid prolonged sitting or standing and use proper lifting techniques.  Left heel pain Chronic pain with numbness and tingling, worsened by weight-bearing activities. Scheduled for podiatry evaluation. May be related to sciatica.  Return to office for new or worsening symptoms, or if symptoms persist.   The patient indicates understanding of these issues and agrees with the plan.  [name] CHRISTELLA Search, FNP

## 2023-11-29 NOTE — Telephone Encounter (Signed)
 FYI Only or Action Required?: FYI only for provider: appointment scheduled on 11/29/23.  Patient was last seen in primary care on 06/20/2023 by Gladis Mustard, FNP.  Called Nurse Triage reporting Hip Pain.  Symptoms began several days ago.  Interventions attempted: Rest, hydration, or home remedies.  Symptoms are: gradually worsening.  Triage Disposition: See PCP When Office is Open (Within 3 Days)  Patient/caregiver understands and will follow disposition?: Yes Reason for Disposition  [1] MODERATE pain (e.g., interferes with normal activities, limping) AND [2] present > 3 days  Answer Assessment - Initial Assessment Questions Pt reports bilateral heel pain and left hip pain that radiates down legs. Also has podiatrist appt in December for heel pain. Hx of sciatica per pt.   1. LOCATION and RADIATION: Where is the pain located? Does the pain spread (shoot) anywhere else?     Left hip, radiates down legs  2. QUALITY: What does the pain feel like?  (e.g., sharp, dull, aching, burning)     Burning and sharp  3. SEVERITY: How bad is the pain? What does it keep you from doing?   (Scale 1-10; or mild, moderate, severe)     8/10 with tylenol   4. ONSET: When did the pain start? Does it come and go, or is it there all the time?     Several days  5. AGGRAVATING FACTORS: What makes the hip pain worse? (e.g., walking, climbing stairs, running)     Sitting is worse  8. OTHER SYMPTOMS: Do you have any other symptoms? (e.g., back pain, pain shooting down leg,  fever, rash)     See above  Protocols used: Hip Pain-A-AH Copied from CRM #[id number]. Topic: Clinical - Red Word Triage >> Nov 29, 2023  8:22 AM Miquel SAILOR wrote: Red Word that prompted transfer to Nurse Triage: LT side sciatic nerve pain to hip then to legs then feet/Both feet heel numb for 3 days

## 2023-12-12 DIAGNOSIS — M79672 Pain in left foot: Secondary | ICD-10-CM | POA: Diagnosis not present

## 2023-12-12 DIAGNOSIS — M722 Plantar fascial fibromatosis: Secondary | ICD-10-CM | POA: Diagnosis not present

## 2023-12-12 DIAGNOSIS — M79671 Pain in right foot: Secondary | ICD-10-CM | POA: Diagnosis not present

## 2023-12-22 ENCOUNTER — Encounter: Payer: Self-pay | Admitting: Nurse Practitioner

## 2023-12-22 ENCOUNTER — Ambulatory Visit: Payer: Self-pay | Admitting: Nurse Practitioner

## 2023-12-22 VITALS — BP 132/85 | HR 66 | Temp 97.0°F | Ht <= 58 in | Wt 119.0 lb

## 2023-12-22 DIAGNOSIS — E785 Hyperlipidemia, unspecified: Secondary | ICD-10-CM

## 2023-12-22 DIAGNOSIS — K219 Gastro-esophageal reflux disease without esophagitis: Secondary | ICD-10-CM

## 2023-12-22 DIAGNOSIS — M8588 Other specified disorders of bone density and structure, other site: Secondary | ICD-10-CM

## 2023-12-22 DIAGNOSIS — I10 Essential (primary) hypertension: Secondary | ICD-10-CM | POA: Diagnosis not present

## 2023-12-22 DIAGNOSIS — K449 Diaphragmatic hernia without obstruction or gangrene: Secondary | ICD-10-CM | POA: Diagnosis not present

## 2023-12-22 DIAGNOSIS — F411 Generalized anxiety disorder: Secondary | ICD-10-CM

## 2023-12-22 DIAGNOSIS — E559 Vitamin D deficiency, unspecified: Secondary | ICD-10-CM | POA: Diagnosis not present

## 2023-12-22 LAB — CBC WITH DIFFERENTIAL/PLATELET
Basophils Absolute: 0 x10E3/uL (ref 0.0–0.2)
Basos: 0 %
EOS (ABSOLUTE): 0.2 x10E3/uL (ref 0.0–0.4)
Eos: 2 %
Hematocrit: 42.7 % (ref 34.0–46.6)
Hemoglobin: 14 g/dL (ref 11.1–15.9)
Immature Grans (Abs): 0 x10E3/uL (ref 0.0–0.1)
Immature Granulocytes: 0 %
Lymphocytes Absolute: 2.7 x10E3/uL (ref 0.7–3.1)
Lymphs: 36 %
MCH: 31.4 pg (ref 26.6–33.0)
MCHC: 32.8 g/dL (ref 31.5–35.7)
MCV: 96 fL (ref 79–97)
Monocytes Absolute: 0.6 x10E3/uL (ref 0.1–0.9)
Monocytes: 8 %
Neutrophils Absolute: 4 x10E3/uL (ref 1.4–7.0)
Neutrophils: 54 %
Platelets: 202 x10E3/uL (ref 150–450)
RBC: 4.46 x10E6/uL (ref 3.77–5.28)
RDW: 13.4 % (ref 11.7–15.4)
WBC: 7.4 x10E3/uL (ref 3.4–10.8)

## 2023-12-22 LAB — CMP14+EGFR
ALT: 21 IU/L (ref 0–32)
AST: 21 IU/L (ref 0–40)
Albumin: 4.3 g/dL (ref 3.8–4.8)
Alkaline Phosphatase: 82 IU/L (ref 49–135)
BUN/Creatinine Ratio: 18 (ref 12–28)
BUN: 13 mg/dL (ref 8–27)
Bilirubin Total: 0.3 mg/dL (ref 0.0–1.2)
CO2: 23 mmol/L (ref 20–29)
Calcium: 9.7 mg/dL (ref 8.7–10.3)
Chloride: 101 mmol/L (ref 96–106)
Creatinine, Ser: 0.73 mg/dL (ref 0.57–1.00)
Globulin, Total: 2.2 g/dL (ref 1.5–4.5)
Glucose: 90 mg/dL (ref 70–99)
Potassium: 4.1 mmol/L (ref 3.5–5.2)
Sodium: 139 mmol/L (ref 134–144)
Total Protein: 6.5 g/dL (ref 6.0–8.5)
eGFR: 86 mL/min/1.73

## 2023-12-22 LAB — LIPID PANEL
Chol/HDL Ratio: 2.8 ratio (ref 0.0–4.4)
Cholesterol, Total: 161 mg/dL (ref 100–199)
HDL: 57 mg/dL
LDL Chol Calc (NIH): 83 mg/dL (ref 0–99)
Triglycerides: 121 mg/dL (ref 0–149)
VLDL Cholesterol Cal: 21 mg/dL (ref 5–40)

## 2023-12-22 MED ORDER — PANTOPRAZOLE SODIUM 40 MG PO TBEC: 40.0000 mg | DELAYED_RELEASE_TABLET | Freq: Every day | ORAL | 1 refills | Status: AC

## 2023-12-22 MED ORDER — METOPROLOL SUCCINATE ER 50 MG PO TB24: 50.0000 mg | ORAL_TABLET | Freq: Every day | ORAL | 1 refills | Status: AC

## 2023-12-22 MED ORDER — ATORVASTATIN CALCIUM 20 MG PO TABS: ORAL_TABLET | ORAL | 1 refills | Status: AC

## 2023-12-22 MED ORDER — ALPRAZOLAM 0.25 MG PO TABS: 0.2500 mg | ORAL_TABLET | Freq: Every evening | ORAL | 5 refills | Status: AC | PRN

## 2023-12-22 NOTE — Patient Instructions (Signed)

## 2023-12-22 NOTE — Progress Notes (Signed)
 "  Subjective:    Patient ID: [name], female    DOB: Jan 16, 1950, 73 y.o.   MRN: [id number]   Chief Complaint: medical management of chronic issues        HPI:  [name] is a 73 y.o. who identifies as a female who was assigned female at birth.   Social history: Lives with: husband Work history: retired   Water Engineer in today for follow up of the following chronic medical issues:  1. Essential hypertension No c/o chest pain, sob or headache. Does not check blood pressure at home. BP Readings from Last 3 Encounters:  11/29/23 (!) 142/77  10/04/23 131/75  06/20/23 130/80     2. Hyperlipidemia with target LDL less than 100 Does watch diet and stays very active walks for exercise several times a week. Lab Results  Component Value Date   CHOL 151 06/20/2023   HDL 45 06/20/2023   LDLCALC 77 06/20/2023   TRIG 170 (H) 06/20/2023   CHOLHDL 3.4 06/20/2023   The 10-year ASCVD risk score (Arnett DK, et al., 2019) is: 22.3%  3. Diaphragmatic hernia without obstruction and without gangrene Has no issues  4. Gastroesophageal reflux disease without esophagitis Is on protonix  and is doing well.  5. Osteopenia of lumbar spine Does weight bearing exercise daily. Last dexascan was done on 07/04/22  6. Vitamin D  deficiency Is on daily vitamin d  supplement  7. GAD Only take xanax  prn- needs less then 2x a month    12/22/2023    8:33 AM 06/20/2023    8:17 AM 05/19/2023    8:53 AM 12/19/2022    8:41 AM  GAD 7 : Generalized Anxiety Score  Nervous, Anxious, on Edge 0 0 0 0  Control/stop worrying 0 0 0 0  Worry too much - different things 0 0 0 0  Trouble relaxing 0 0 0 0  Restless 0 0 0 0  Easily annoyed or irritable 0 0 0 0  Afraid - awful might happen 0 0 0 0  Total GAD 7 Score 0 0 0 0  Anxiety Difficulty Not difficult at all Not difficult at all Not difficult at all Not difficult at all      New complaints: None today  Allergies  Allergen Reactions   Codeine  Other (See Comments)    GI upset   Sulfonamide Derivatives     REACTION: severe GI upset   Outpatient Encounter Medications as of 12/22/2023  Medication Sig   ALPRAZolam  (XANAX ) 0.25 MG tablet Take 1 tablet (0.25 mg total) by mouth at bedtime as needed.   aspirin 81 MG chewable tablet Chew by mouth daily.   atorvastatin  (LIPITOR) 20 MG tablet TAKE 1 TABLET BY MOUTH DAILY AT 6PM   BIOTIN PO Take by mouth.   CALCIUM -MAGNESIUM PO Take by mouth.   Cranberry 400 MG CAPS Take by mouth.   LUMIGAN 0.01 % SOLN    metoprolol  succinate (TOPROL -XL) 50 MG 24 hr tablet Take 1 tablet (50 mg total) by mouth daily. with food   pantoprazole  (PROTONIX ) 40 MG tablet Take 1 tablet (40 mg total) by mouth daily.   No facility-administered encounter medications on file as of 12/22/2023.    Past Surgical History:  Procedure Laterality Date   CARDIAC CATHETERIZATION     COLPOSCOPY     excision of mole  2007   GYNECOLOGIC CRYOSURGERY     LAPAROSCOPIC CHOLECYSTECTOMY     TUBAL LIGATION  1991    Family History  Problem Relation Age of Onset   Hypertension Mother    Heart disease Mother    Hyperlipidemia Mother    Glaucoma Mother    Hypertension Father    Cancer Father        LUNG   Hyperlipidemia Father    Diabetes Sister    Hypertension Sister    Hyperlipidemia Sister    Fibromyalgia Daughter    Asthma Daughter       Controlled substance contract: 07/12/22     Review of Systems  Constitutional:  Negative for diaphoresis.  Eyes:  Negative for pain.  Respiratory:  Negative for shortness of breath.   Cardiovascular:  Negative for chest pain, palpitations and leg swelling.  Gastrointestinal:  Negative for abdominal pain.  Endocrine: Negative for polydipsia.  Skin:  Negative for rash.  Neurological:  Negative for dizziness, weakness and headaches.  Hematological:  Does not bruise/bleed easily.  All other systems reviewed and are negative.      Objective:   Physical Exam Vitals and  nursing note reviewed.  Constitutional:      General: She is not in acute distress.    Appearance: Normal appearance. She is well-developed.  HENT:     Head: Normocephalic.     Right Ear: Tympanic membrane normal.     Left Ear: Tympanic membrane normal.     Nose: Nose normal.     Mouth/Throat:     Mouth: Mucous membranes are moist.  Eyes:     Pupils: Pupils are equal, round, and reactive to light.  Neck:     Vascular: No carotid bruit or JVD.  Cardiovascular:     Rate and Rhythm: Normal rate and regular rhythm.     Heart sounds: Normal heart sounds.  Pulmonary:     Effort: Pulmonary effort is normal. No respiratory distress.     Breath sounds: Normal breath sounds. No wheezing or rales.  Chest:     Chest wall: No tenderness.  Abdominal:     General: Bowel sounds are normal. There is no distension or abdominal bruit.     Palpations: Abdomen is soft. There is no hepatomegaly, splenomegaly, mass or pulsatile mass.     Tenderness: There is no abdominal tenderness.  Musculoskeletal:        General: Normal range of motion.     Cervical back: Normal range of motion and neck supple.  Lymphadenopathy:     Cervical: No cervical adenopathy.  Skin:    General: Skin is warm and dry.  Neurological:     Mental Status: She is alert and oriented to person, place, and time.     Deep Tendon Reflexes: Reflexes are normal and symmetric.  Psychiatric:        Behavior: Behavior normal.        Thought Content: Thought content normal.        Judgment: Judgment normal.    BP 132/85   Pulse 66   Temp (!) 97 F (36.1 C) (Temporal)   Ht 4' 10 (1.473 m)   Wt 119 lb (54 kg)   SpO2 99%   BMI 24.87 kg/m           Assessment & Plan:   [name] comes in today with chief complaint of Annual physical Diagnosis and orders addressed:  1. Essential hypertension (Primary) Low sodium diet - metoprolol  succinate (TOPROL -XL) 50 MG 24 hr tablet; Take 1 tablet (50 mg total) by mouth daily.  with food  Dispense: 90 tablet; Refill: 1 -  CBC with Differential/Platelet - CMP14+EGFR  2. Hyperlipidemia with target LDL less than 100 Lowfat diet - atorvastatin  (LIPITOR) 20 MG tablet; TAKE 1 TABLET BY MOUTH DAILY AT 6PM  Dispense: 90 tablet; Refill: 1 - Lipid panel  3. Diaphragmatic hernia without obstruction and without gangrene  4. Gastroesophageal reflux disease without esophagitis Avoid spicy foods Do not eat 2 hours prior to bedtime - pantoprazole  (PROTONIX ) 40 MG tablet; Take 1 tablet (40 mg total) by mouth daily.  Dispense: 90 tablet; Refill: 1  5. Osteopenia of lumbar spine Continue weight bearing exercises  6. Vitamin D  deficiency   7. GAD (generalized anxiety disorder) Stress management - ALPRAZolam  (XANAX ) 0.25 MG tablet; Take 1 tablet (0.25 mg total) by mouth at bedtime as needed.  Dispense: 30 tablet; Refill: 5   Labs pending Health Maintenance reviewed Diet and exercise encouraged  Follow up plan: 6 months   Mary-Margaret Gladis, FNP  "

## 2023-12-25 ENCOUNTER — Ambulatory Visit: Payer: Self-pay | Admitting: Nurse Practitioner

## 2023-12-26 LAB — TOXASSURE SELECT 13 (MW), URINE

## 2024-01-05 ENCOUNTER — Other Ambulatory Visit: Payer: Self-pay | Admitting: Nurse Practitioner

## 2024-01-05 DIAGNOSIS — Z1231 Encounter for screening mammogram for malignant neoplasm of breast: Secondary | ICD-10-CM

## 2024-01-17 ENCOUNTER — Other Ambulatory Visit: Payer: Self-pay | Admitting: Medical Genetics

## 2024-01-24 ENCOUNTER — Ambulatory Visit
Admission: RE | Admit: 2024-01-24 | Discharge: 2024-01-24 | Disposition: A | Source: Ambulatory Visit | Attending: Nurse Practitioner | Admitting: Nurse Practitioner

## 2024-01-24 DIAGNOSIS — Z1231 Encounter for screening mammogram for malignant neoplasm of breast: Secondary | ICD-10-CM

## 2024-02-07 ENCOUNTER — Other Ambulatory Visit: Payer: Self-pay | Admitting: Medical Genetics

## 2024-02-07 DIAGNOSIS — Z006 Encounter for examination for normal comparison and control in clinical research program: Secondary | ICD-10-CM

## 2024-06-21 ENCOUNTER — Ambulatory Visit: Admitting: Nurse Practitioner

## 2024-10-09 ENCOUNTER — Ambulatory Visit: Admitting: Urology
